# Patient Record
Sex: Male | Born: 1955
Health system: Southern US, Community
[De-identification: ages and names within clinical notes are randomized; demographics above are authoritative.]

## PROBLEM LIST (undated history)

## (undated) DIAGNOSIS — E785 Hyperlipidemia, unspecified: Secondary | ICD-10-CM

## (undated) DIAGNOSIS — C61 Malignant neoplasm of prostate: Secondary | ICD-10-CM

## (undated) DIAGNOSIS — E871 Hypo-osmolality and hyponatremia: Secondary | ICD-10-CM

## (undated) DIAGNOSIS — N529 Male erectile dysfunction, unspecified: Secondary | ICD-10-CM

## (undated) DIAGNOSIS — I2510 Atherosclerotic heart disease of native coronary artery without angina pectoris: Secondary | ICD-10-CM

## (undated) DIAGNOSIS — R001 Bradycardia, unspecified: Secondary | ICD-10-CM

## (undated) DIAGNOSIS — I509 Heart failure, unspecified: Secondary | ICD-10-CM

## (undated) DIAGNOSIS — F1010 Alcohol abuse, uncomplicated: Secondary | ICD-10-CM

## (undated) DIAGNOSIS — I428 Other cardiomyopathies: Secondary | ICD-10-CM

## (undated) DIAGNOSIS — I739 Peripheral vascular disease, unspecified: Secondary | ICD-10-CM

## (undated) DIAGNOSIS — N183 Chronic kidney disease, stage 3 unspecified: Secondary | ICD-10-CM

## (undated) DIAGNOSIS — J449 Chronic obstructive pulmonary disease, unspecified: Secondary | ICD-10-CM

## (undated) DIAGNOSIS — I10 Essential (primary) hypertension: Secondary | ICD-10-CM

## (undated) DIAGNOSIS — N401 Enlarged prostate with lower urinary tract symptoms: Secondary | ICD-10-CM

## (undated) DIAGNOSIS — R0600 Dyspnea, unspecified: Secondary | ICD-10-CM

## (undated) DIAGNOSIS — R9431 Abnormal electrocardiogram [ECG] [EKG]: Secondary | ICD-10-CM

## (undated) DIAGNOSIS — I5022 Chronic systolic (congestive) heart failure: Secondary | ICD-10-CM

## (undated) DIAGNOSIS — Z720 Tobacco use: Secondary | ICD-10-CM

## (undated) DIAGNOSIS — I440 Atrioventricular block, first degree: Secondary | ICD-10-CM

## (undated) DIAGNOSIS — K219 Gastro-esophageal reflux disease without esophagitis: Secondary | ICD-10-CM

## (undated) DIAGNOSIS — I219 Acute myocardial infarction, unspecified: Secondary | ICD-10-CM

## (undated) HISTORY — DX: Acute myocardial infarction, unspecified: I21.9

## (undated) HISTORY — PX: APPENDECTOMY: SHX54

## (undated) HISTORY — DX: Abnormal electrocardiogram (ECG) (EKG): R94.31

## (undated) HISTORY — DX: Hypo-osmolality and hyponatremia: E87.1

## (undated) HISTORY — DX: Other cardiomyopathies: I42.8

## (undated) HISTORY — DX: Atherosclerotic heart disease of native coronary artery without angina pectoris: I25.10

## (undated) HISTORY — DX: Chronic kidney disease, stage 3 (moderate): N18.3

## (undated) HISTORY — DX: Peripheral vascular disease, unspecified: I73.9

## (undated) HISTORY — DX: Heart failure, unspecified: I50.9

## (undated) HISTORY — DX: Bradycardia, unspecified: R00.1

## (undated) HISTORY — DX: Chronic kidney disease, stage 3 unspecified: N18.30

## (undated) HISTORY — DX: Hyperlipidemia, unspecified: E78.5

## (undated) HISTORY — DX: Essential (primary) hypertension: I10

## (undated) HISTORY — DX: Gastro-esophageal reflux disease without esophagitis: K21.9

---

## 2007-07-01 ENCOUNTER — Emergency Department (HOSPITAL_COMMUNITY): Admission: EM | Admit: 2007-07-01 | Discharge: 2007-07-01 | Payer: Self-pay | Admitting: Emergency Medicine

## 2010-09-29 LAB — CBC
Platelets: 231
RDW: 12.9

## 2010-09-29 LAB — URINALYSIS, ROUTINE W REFLEX MICROSCOPIC
Bilirubin Urine: NEGATIVE
Glucose, UA: NEGATIVE
Hgb urine dipstick: NEGATIVE
Ketones, ur: 15 — AB
Nitrite: NEGATIVE
Protein, ur: 30 — AB
Specific Gravity, Urine: 1.026
Urobilinogen, UA: 0.2
pH: 8

## 2010-09-29 LAB — URINE MICROSCOPIC-ADD ON

## 2010-09-29 LAB — BASIC METABOLIC PANEL
BUN: 12
CO2: 26
Calcium: 10
Chloride: 99
Creatinine, Ser: 1.01
GFR calc Af Amer: >60
GFR calc non Af Amer: >60
Glucose, Bld: 118 — ABNORMAL HIGH
Potassium: 3.6
Sodium: 136

## 2010-09-29 LAB — URINE CULTURE: Colony Count: 15000

## 2010-09-29 LAB — HEPATIC FUNCTION PANEL
ALT: 23
AST: 31
Indirect Bilirubin: 0.9
Total Protein: 8.8 — ABNORMAL HIGH

## 2010-09-29 LAB — DIFFERENTIAL
Basophils Absolute: 0.2 — ABNORMAL HIGH
Basophils Relative: 2 — ABNORMAL HIGH
Lymphocytes Relative: 16
Neutro Abs: 6.6

## 2015-07-03 DIAGNOSIS — I252 Old myocardial infarction: Secondary | ICD-10-CM

## 2015-07-03 HISTORY — DX: Old myocardial infarction: I25.2

## 2015-07-18 ENCOUNTER — Emergency Department (HOSPITAL_COMMUNITY): Payer: Self-pay

## 2015-07-18 ENCOUNTER — Inpatient Hospital Stay (HOSPITAL_COMMUNITY)
Admission: EM | Admit: 2015-07-18 | Discharge: 2015-07-21 | DRG: 281 | Disposition: A | Discharge status: Home / Self Care | Payer: Self-pay | Attending: Internal Medicine | Admitting: Internal Medicine

## 2015-07-18 ENCOUNTER — Encounter (HOSPITAL_COMMUNITY): Payer: Self-pay

## 2015-07-18 DIAGNOSIS — I2510 Atherosclerotic heart disease of native coronary artery without angina pectoris: Secondary | ICD-10-CM | POA: Diagnosis present

## 2015-07-18 DIAGNOSIS — I119 Hypertensive heart disease without heart failure: Secondary | ICD-10-CM

## 2015-07-18 DIAGNOSIS — Z87898 Personal history of other specified conditions: Secondary | ICD-10-CM

## 2015-07-18 DIAGNOSIS — Z8249 Family history of ischemic heart disease and other diseases of the circulatory system: Secondary | ICD-10-CM

## 2015-07-18 DIAGNOSIS — I519 Heart disease, unspecified: Secondary | ICD-10-CM

## 2015-07-18 DIAGNOSIS — E871 Hypo-osmolality and hyponatremia: Secondary | ICD-10-CM | POA: Diagnosis present

## 2015-07-18 DIAGNOSIS — I209 Angina pectoris, unspecified: Secondary | ICD-10-CM

## 2015-07-18 DIAGNOSIS — I5189 Other ill-defined heart diseases: Secondary | ICD-10-CM

## 2015-07-18 DIAGNOSIS — I10 Essential (primary) hypertension: Secondary | ICD-10-CM | POA: Diagnosis present

## 2015-07-18 DIAGNOSIS — I959 Hypotension, unspecified: Secondary | ICD-10-CM | POA: Diagnosis not present

## 2015-07-18 DIAGNOSIS — E785 Hyperlipidemia, unspecified: Secondary | ICD-10-CM | POA: Diagnosis present

## 2015-07-18 DIAGNOSIS — F17210 Nicotine dependence, cigarettes, uncomplicated: Secondary | ICD-10-CM

## 2015-07-18 DIAGNOSIS — R079 Chest pain, unspecified: Secondary | ICD-10-CM | POA: Diagnosis present

## 2015-07-18 DIAGNOSIS — I214 Non-ST elevation (NSTEMI) myocardial infarction: Principal | ICD-10-CM | POA: Diagnosis present

## 2015-07-18 DIAGNOSIS — F17200 Nicotine dependence, unspecified, uncomplicated: Secondary | ICD-10-CM | POA: Diagnosis present

## 2015-07-18 LAB — BASIC METABOLIC PANEL
ANION GAP: 10 (ref 5–15)
BUN: 12 mg/dL (ref 6–20)
CALCIUM: 9.6 mg/dL (ref 8.9–10.3)
CO2: 25 mmol/L (ref 22–32)
CREATININE: 1.03 mg/dL (ref 0.61–1.24)
Chloride: 97 mmol/L — ABNORMAL LOW (ref 101–111)
Glucose, Bld: 121 mg/dL — ABNORMAL HIGH (ref 65–99)
Potassium: 3.5 mmol/L (ref 3.5–5.1)
SODIUM: 132 mmol/L — AB (ref 135–145)

## 2015-07-18 LAB — RAPID URINE DRUG SCREEN, HOSP PERFORMED
Amphetamines: NOT DETECTED
Barbiturates: NOT DETECTED
Benzodiazepines: NOT DETECTED
Cocaine: NOT DETECTED
OPIATES: NOT DETECTED
Tetrahydrocannabinol: POSITIVE — AB

## 2015-07-18 LAB — LIPID PANEL
Cholesterol: 238 mg/dL — ABNORMAL HIGH (ref 0–200)
HDL: 67 mg/dL (ref >40)
LDL Cholesterol: 155 mg/dL — ABNORMAL HIGH (ref 0–99)
Total CHOL/HDL Ratio: 3.6 RATIO
Triglycerides: 81 mg/dL (ref <150)
VLDL: 16 mg/dL (ref 0–40)

## 2015-07-18 LAB — I-STAT TROPONIN, ED: TROPONIN I, POC: 0.94 ng/mL — AB (ref 0.00–0.08)

## 2015-07-18 LAB — TROPONIN I: Troponin I: 2 ng/mL (ref <0.03)

## 2015-07-18 LAB — HEPATIC FUNCTION PANEL
ALBUMIN: 4.3 g/dL (ref 3.5–5.0)
ALK PHOS: 60 U/L (ref 38–126)
ALT: 28 U/L (ref 17–63)
AST: 55 U/L — ABNORMAL HIGH (ref 15–41)
Bilirubin, Direct: <0.1 mg/dL — ABNORMAL LOW (ref 0.1–0.5)
TOTAL PROTEIN: 9.3 g/dL — AB (ref 6.5–8.1)
Total Bilirubin: 1 mg/dL (ref 0.3–1.2)

## 2015-07-18 LAB — CBC
HCT: 51.4 % (ref 39.0–52.0)
HEMOGLOBIN: 17.8 g/dL — AB (ref 13.0–17.0)
MCH: 33.5 pg (ref 26.0–34.0)
MCHC: 34.6 g/dL (ref 30.0–36.0)
MCV: 96.6 fL (ref 78.0–100.0)
PLATELETS: 282 10*3/uL (ref 150–400)
RBC: 5.32 MIL/uL (ref 4.22–5.81)
RDW: 12.7 % (ref 11.5–15.5)
WBC: 8.4 10*3/uL (ref 4.0–10.5)

## 2015-07-18 LAB — MRSA PCR SCREENING: MRSA BY PCR: NEGATIVE

## 2015-07-18 LAB — HEPARIN LEVEL (UNFRACTIONATED): Heparin Unfractionated: 0.29 IU/mL — ABNORMAL LOW (ref 0.30–0.70)

## 2015-07-18 LAB — LIPASE, BLOOD: LIPASE: 19 U/L (ref 11–51)

## 2015-07-18 MED ORDER — ADULT MULTIVITAMIN W/MINERALS CH
1.0000 | ORAL_TABLET | Freq: Every day | ORAL | Status: DC
  Administered 2015-07-18 – 2015-07-21 (×4): 1 via ORAL
  Filled 2015-07-18 (×4): qty 1

## 2015-07-18 MED ORDER — FOLIC ACID 1 MG PO TABS
1.0000 mg | ORAL_TABLET | Freq: Every day | ORAL | Status: DC
  Administered 2015-07-18 – 2015-07-21 (×4): 1 mg via ORAL
  Filled 2015-07-18 (×4): qty 1

## 2015-07-18 MED ORDER — FENTANYL CITRATE (PF) 100 MCG/2ML IJ SOLN
50.0000 ug | Freq: Once | INTRAMUSCULAR | Status: AC
  Administered 2015-07-18: 50 ug via INTRAVENOUS
  Filled 2015-07-18: qty 2

## 2015-07-18 MED ORDER — SODIUM CHLORIDE 0.9 % WEIGHT BASED INFUSION
3.0000 mL/kg/h | INTRAVENOUS | Status: DC
  Administered 2015-07-19: 3 mL/kg/h via INTRAVENOUS

## 2015-07-18 MED ORDER — NITROGLYCERIN IN D5W 200-5 MCG/ML-% IV SOLN
0.0000 ug/min | INTRAVENOUS | Status: DC
  Administered 2015-07-18: 5 ug/min via INTRAVENOUS
  Filled 2015-07-18: qty 250

## 2015-07-18 MED ORDER — METOPROLOL TARTRATE 25 MG PO TABS: 12.5000 mg | ORAL_TABLET | Freq: Two times a day (BID) | ORAL | Status: DC

## 2015-07-18 MED ORDER — SODIUM CHLORIDE 0.9 % IV BOLUS (SEPSIS)
1000.0000 mL | Freq: Once | INTRAVENOUS | Status: AC
  Administered 2015-07-18: 1000 mL via INTRAVENOUS

## 2015-07-18 MED ORDER — SODIUM CHLORIDE 0.9% FLUSH: 3.0000 mL | INTRAVENOUS | Status: DC | PRN

## 2015-07-18 MED ORDER — ASPIRIN 81 MG PO CHEW
81.0000 mg | CHEWABLE_TABLET | ORAL | Status: AC
  Administered 2015-07-19: 81 mg via ORAL
  Filled 2015-07-18: qty 1

## 2015-07-18 MED ORDER — ATORVASTATIN CALCIUM 40 MG PO TABS
40.0000 mg | ORAL_TABLET | Freq: Every day | ORAL | Status: DC
  Administered 2015-07-18 – 2015-07-20 (×3): 40 mg via ORAL
  Filled 2015-07-18 (×3): qty 1

## 2015-07-18 MED ORDER — SODIUM CHLORIDE 0.9% FLUSH
3.0000 mL | Freq: Two times a day (BID) | INTRAVENOUS | Status: DC
  Administered 2015-07-18 – 2015-07-19 (×2): 3 mL via INTRAVENOUS

## 2015-07-18 MED ORDER — SODIUM CHLORIDE 0.9 % WEIGHT BASED INFUSION
1.0000 mL/kg/h | INTRAVENOUS | Status: DC
Start: 2015-07-19 — End: 2015-07-19
  Administered 2015-07-19: 1 mL/kg/h via INTRAVENOUS

## 2015-07-18 MED ORDER — ONDANSETRON HCL 4 MG/2ML IJ SOLN: 4.0000 mg | Freq: Once | INTRAMUSCULAR | Status: DC

## 2015-07-18 MED ORDER — METOPROLOL TARTRATE 25 MG PO TABS
25.0000 mg | ORAL_TABLET | Freq: Two times a day (BID) | ORAL | Status: DC
  Administered 2015-07-18 – 2015-07-19 (×3): 25 mg via ORAL
  Filled 2015-07-18 (×3): qty 1

## 2015-07-18 MED ORDER — THIAMINE HCL 100 MG/ML IJ SOLN
100.0000 mg | Freq: Every day | INTRAMUSCULAR | Status: DC
  Administered 2015-07-19: 100 mg via INTRAVENOUS
  Filled 2015-07-18: qty 1
  Filled 2015-07-18: qty 2
  Filled 2015-07-18: qty 1

## 2015-07-18 MED ORDER — HEPARIN (PORCINE) IN NACL 100-0.45 UNIT/ML-% IJ SOLN
1100.0000 [IU]/h | INTRAMUSCULAR | Status: DC
  Administered 2015-07-18: 1000 [IU]/h via INTRAVENOUS
  Filled 2015-07-18 (×2): qty 250

## 2015-07-18 MED ORDER — ASPIRIN 81 MG PO CHEW: 324.0000 mg | CHEWABLE_TABLET | Freq: Once | ORAL | Status: DC

## 2015-07-18 MED ORDER — SODIUM CHLORIDE 0.9 % IV SOLN: 250.0000 mL | INTRAVENOUS | Status: DC | PRN

## 2015-07-18 MED ORDER — VITAMIN B-1 100 MG PO TABS
100.0000 mg | ORAL_TABLET | Freq: Every day | ORAL | Status: DC
  Administered 2015-07-18 – 2015-07-21 (×3): 100 mg via ORAL
  Filled 2015-07-18 (×4): qty 1

## 2015-07-18 MED ORDER — HEPARIN (PORCINE) IN NACL 100-0.45 UNIT/ML-% IJ SOLN: 12.0000 [IU]/kg/h | INTRAMUSCULAR | Status: DC

## 2015-07-18 MED ORDER — ASPIRIN EC 81 MG PO TBEC
81.0000 mg | DELAYED_RELEASE_TABLET | Freq: Every day | ORAL | Status: DC
  Administered 2015-07-19 – 2015-07-21 (×3): 81 mg via ORAL
  Filled 2015-07-18 (×3): qty 1

## 2015-07-18 MED ORDER — ONDANSETRON HCL 4 MG/2ML IJ SOLN
4.0000 mg | Freq: Four times a day (QID) | INTRAMUSCULAR | Status: DC | PRN
  Filled 2015-07-18: qty 2

## 2015-07-18 MED ORDER — HEPARIN SODIUM (PORCINE) 5000 UNIT/ML IJ SOLN
4000.0000 [IU] | Freq: Once | INTRAMUSCULAR | Status: AC
  Administered 2015-07-18: 4000 [IU] via INTRAVENOUS

## 2015-07-18 MED ORDER — LISINOPRIL 5 MG PO TABS
5.0000 mg | ORAL_TABLET | Freq: Every day | ORAL | Status: DC
  Administered 2015-07-18: 5 mg via ORAL
  Filled 2015-07-18: qty 1

## 2015-07-18 MED ORDER — ACETAMINOPHEN 325 MG PO TABS: 650.0000 mg | ORAL_TABLET | ORAL | Status: DC | PRN

## 2015-07-18 MED ORDER — NITROGLYCERIN 2 % TD OINT
1.0000 [in_us] | TOPICAL_OINTMENT | Freq: Once | TRANSDERMAL | Status: AC
  Administered 2015-07-18: 1 [in_us] via TOPICAL
  Filled 2015-07-18: qty 1

## 2015-07-18 MED ORDER — NITROGLYCERIN 0.4 MG SL SUBL
0.4000 mg | SUBLINGUAL_TABLET | SUBLINGUAL | Status: DC | PRN
  Administered 2015-07-18: 0.4 mg via SUBLINGUAL
  Filled 2015-07-18: qty 1

## 2015-07-18 MED ORDER — ATORVASTATIN CALCIUM 40 MG PO TABS: 40.0000 mg | ORAL_TABLET | Freq: Every day | ORAL | Status: DC

## 2015-07-18 NOTE — Progress Notes (Signed)
ANTICOAGULATION CONSULT NOTE - Initial Consult  Pharmacy Consult:  Heparin Indication: chest pain/ACS  No Known Allergies  Patient Measurements: Height: 6' (182.9 cm) Weight: 178 lb (80.74 kg) IBW/kg (Calculated) : 77.6 Heparin Dosing Weight: 81 kg  Vital Signs: BP: 173/99 mmHg (07/16 1430) Pulse Rate: 71 (07/16 1430)  Labs:  Recent Labs  07/18/15 1345  HGB 17.8*  HCT 51.4  PLT 282  CREATININE 1.03    Estimated Creatinine Clearance: 83.7 mL/min (by C-G formula based on Cr of 1.03).   Medical History: History reviewed. No pertinent past medical history.    Assessment: Andres Boyd presented with history of chest for a couple of days.  Pharmacy consulted to initiate IV heparin for ACS.  Baseline labs and home meds reviewed.   Goal of Therapy:  Heparin level 0.3-0.7 units/ml Monitor platelets by anticoagulation protocol: Yes    Plan:  - Heparin 4000 units IV bolus as ordered - Reduce heparin gtt to 1000 units/hr - Check 6 hr HL - Daily HL / CBC   Yoon Barca D. Mina Marble, PharmD, BCPS Pager:  [phone] 07/18/2015, 3:12 PM

## 2015-07-18 NOTE — Progress Notes (Signed)
ANTICOAGULATION CONSULT NOTE - Follow Up Consult  Pharmacy Consult for heparin Indication: NSTEMI  Labs:  Recent Labs  07/18/15 1345 07/18/15 1947 07/18/15 2242  HGB 17.8*  --   --   HCT 51.4  --   --   PLT 282  --   --   HEPARINUNFRC  --   --  0.29*  CREATININE 1.03  --   --   TROPONINI  --  2.00*  --     Assessment: 60yo male slightly subtherapeutic on heparin with initial dosing for NSTEMI.  Goal of Therapy:  Heparin level 0.3-0.7 units/ml   Plan:  Will increase heparin gtt slightly to 1100 units/hr and check level in [place], PharmD, BCPS  07/18/2015,11:24 PM

## 2015-07-18 NOTE — ED Notes (Signed)
Attempted blood draw X2 without success. Will ask phlebotomy to attempt.

## 2015-07-18 NOTE — Progress Notes (Signed)
Md at bedside. Aware of trop 2.00 called from lab.  Given 1 SL NTG pain now 2/10 CP.  Will continue to monitor. Saunders Revel T

## 2015-07-18 NOTE — Progress Notes (Signed)
SUBJECTIVE Called to bedside by RN for worsening chest pain. At bedside, patient reports fluctuating chest pain that's been of the same quality since admission localized to the lower left chest. Pain is associated with nausea. No prior cardiac history. No change in quality with breathing.  Of note, he does acknowledge having >4 drinks of alcohol infrequently, most recently on Thursday. No prior history of withdrawal.    OBJECTIVE Filed Vitals:   07/18/15 2020 07/18/15 2025  BP: 142/126 140/118  Pulse: 109 101  Resp: 11 17   General: resting in bed, no acute distress Cardiac: tachycardia, no rubs, murmurs or gallops Pulm: clear to auscultation bilaterally, no wheezes, rales, or rhonchi Abd: soft, nontender, nondistended, BS present Ext: warm and well perfused, no pedal edema Neuro: alert and oriented X3, responding to questions appropriately  ASSESSMENT Suspect chest pain is in the setting of his NSTEMI. Recent troponin elevated at 2.0, up from   EKG with sinus tachycardia though ST changes noted in II, III, V3-V5 with prolonged QTc 505.   PLAN -Spoke with cardiology who recommended nitro gtt at 10 mcg/min -Discontinued ondansetron given QTc prolongation -Started CIWA scoring protocol

## 2015-07-18 NOTE — H&P (Addendum)
Date: 07/18/2015               Patient Name:  Andres Boyd MRN: [id number]  DOB: 01-Sep-1955 Age / Sex: 60 y.o., male   PCP: No primary care provider on file.         Medical Service: Internal Medicine Teaching Service         Attending Physician: Dr. Aldine Contes, MD    First Contact: Dr. Asencion Partridge Pager: [phone]  Second Contact: Dr. Jacques Earthly Pager: [phone]       After Hours (After 5p/  First Contact Pager: [phone]  weekends / holidays): Second Contact Pager: [phone]   Chief Complaint: Chest pain and nausea  History of Present Illness: Andres Boyd is a pleasant 60 year old gentleman with no documented PMH who has not seen a doctor in years, who developed chest pain and shortness of breath while moving furniture in the heat on Friday. This chest pain gradually increased and feels like heavy substernal pressure 9-10/10. This pain is also accompanied by significant nausea and retching, and he has had one episode of emesis. After this pain began he left work and went home to rest, but the pain persisted but came and went in intensity. The pain and nausea and prevented him sleeping and eating since Friday. Andres Boyd has never had an episode like this before. He denies neck, shoulder, jaw pain. He has a significant smoking and alcohol history, denies recreational drugs other than marijuana. He says that the aspirin and nitroglycerin he has received in the ED has improved his chest pain to a 4/10.    He received 325 mg Aspirin, 4 mg Zofran, Albuterol, Atroven, and 500cc IVF prior to arrival in ED. ED vitals 165/101, HR 79, RR 16, 100% on RA. Initial troponin elevated at 0.94, EKG shows LVH but no ST changes. CBC, Hepatic function panel, CXR largely unremarkable, lipase negative.  Meds: Current Facility-Administered Medications  Medication Dose Route Frequency Provider Last Rate Last Dose  . 0.9 %  sodium chloride infusion  250 mL Intravenous PRN Bhavinkumar Bhagat, PA      .  [START ON 07/19/2015] 0.9% sodium chloride infusion  3 mL/kg/hr Intravenous Continuous Bhavinkumar Bhagat, PA       Followed by  . [START ON 07/19/2015] 0.9% sodium chloride infusion  1 mL/kg/hr Intravenous Continuous Bhavinkumar Bhagat, PA      . acetaminophen (TYLENOL) tablet 650 mg  650 mg Oral Q4H PRN Milagros Loll, MD      . aspirin chewable tablet 324 mg  324 mg Oral Once Margette Fast, MD   324 mg at 07/18/15 1445  . [START ON 07/19/2015] aspirin chewable tablet 81 mg  81 mg Oral Pre-Cath Bhavinkumar [place], [place]      . [START ON 07/19/2015] aspirin EC tablet 81 mg  81 mg Oral Daily Milagros Loll, MD      . atorvastatin (LIPITOR) tablet 40 mg  40 mg Oral q1800 Milagros Loll, MD      . folic acid (FOLVITE) tablet 1 mg  1 mg Oral Daily Asencion Partridge, MD      . heparin ADULT infusion 100 units/mL (25000 units/244mL sodium chloride 0.45%)  1,000 Units/hr Intravenous Continuous Tyrone Apple, RPH 10 mL/hr at 07/18/15 1619 1,000 Units/hr at 07/18/15 1619  . lisinopril (PRINIVIL,ZESTRIL) tablet 5 mg  5 mg Oral Daily Dorothy Spark, MD   5 mg at 07/18/15 1650  . metoprolol tartrate (LOPRESSOR)  tablet 25 mg  25 mg Oral BID Dorothy Spark, MD      . multivitamin with minerals tablet 1 tablet  1 tablet Oral Daily Asencion Partridge, MD      . nitroGLYCERIN (NITROSTAT) SL tablet 0.4 mg  0.4 mg Sublingual Q5 Min x 3 PRN Milagros Loll, MD      . ondansetron [facility]) injection 4 mg  4 mg Intravenous Once Margette Fast, MD      . ondansetron [facility]) injection 4 mg  4 mg Intravenous Q6H PRN Milagros Loll, MD      . sodium chloride flush (NS) 0.9 % injection 3 mL  3 mL Intravenous Q12H Bhavinkumar Bhagat, PA      . sodium chloride flush (NS) 0.9 % injection 3 mL  3 mL Intravenous PRN Bhavinkumar Bhagat, PA      . thiamine (VITAMIN B-1) tablet 100 mg  100 mg Oral Daily Asencion Partridge, MD       Or  . thiamine (B-1) injection 100 mg  100 mg Intravenous Daily Asencion Partridge, MD         Allergies: Allergies as of 07/18/2015  . (No Known Allergies)   History reviewed. No pertinent past medical history.  Family History: Multiple relatives with HTN, DM, denies family history of MI, CVA, DVT, PE  Social History: He lives at home with his two sons and works for a [employer]. His wife passed away approx 6 months ago from stage 4 lung cancer. He has a 42 pack year smoking history, denies chewing tobacco, he drinks at least 5-6 beers a day, sometimes much more, and occasionally marijuana, denies other recreational drug use.  Review of Systems: A complete ROS was negative except as per HPI.   Physical Exam: Blood pressure 152/106, pulse 93, resp. rate 15, height 6' (1.829 m), weight 80.74 kg (178 lb), SpO2 100 %. General appearance: Gentleman resting in bed, mild distress, appears older than stated age HENT: Normocephalic, atraumatic, poor dentition, no audible carotid bruits, no JVD, trachea midline Cardiovascular: Regular rate and rhythm, no audible murmurs, rubs, gallops Respiratory: Clear to auscultation bilaterally, normal work of breathing Abdomen: Soft, bowel sounds present, non-tender, non-distended Skin: Warm, dry intact Extremities: Normal bulk and ROM, no edema, DP/PT 2+ bilaterally Neuro: Cranial nerves grossly intact, alert and oriented Psych: Appropriate affect  EKG: NSR with LVH  CXR: Negative for cardiopulmonary disease  Troponin (Point of Care Test)  Recent Labs  07/18/15 1356  TROPIPOC 0.94*   Drugs of Abuse     Component Value Date/Time   LABOPIA NONE DETECTED 07/18/2015 1635   COCAINSCRNUR NONE DETECTED 07/18/2015 1635   LABBENZ NONE DETECTED 07/18/2015 1635   AMPHETMU NONE DETECTED 07/18/2015 1635   THCU POSITIVE* 07/18/2015 1635   LABBARB NONE DETECTED 07/18/2015 1635    Assessment & Plan by Problem: Andres Boyd is a 60 year old gentleman with no documented PMH who presents with chest pain and  NSTEMI.  1. NSTEMI, no  ischemic EKG changes, initial troponin elevated at 0.94, patient with untreated HTN, smoking history, cardiology following  - Trend serial troponins  - Heparin gtt  - Telemetry  - NPO at midnight for cath tomorrow  - Asa 325 qd  - Metoprolol 25mg  BID  - Lisinopril 5mg  QD  - Atorvastatin 40mg   - Misc cardiac:   - Lipid panel pending   - HbA1c pending   - UDS only pos for MJ  2. Hypertension, 160s/100s on exam  -  Starting Metoprolol and Lisinopril   3. LVH, cardiac function undetermined  - TTE  4. Preventative health  - HIV pending  - HepCV pending  Dispo: Admit patient to Observation with expected length of stay less than 2 midnights.  Signed: Asencion Partridge, MD 07/18/2015, 7:25 PM  Pager: [phone]

## 2015-07-18 NOTE — Consult Note (Signed)
CARDIOLOGY CONSULT NOTE   Patient ID: Yochanan Dempewolf MRN: [id number], DOB/AGE: 08/06/1955   Admit date: 07/18/2015 Date of Consult: 07/18/2015  Primary Physician: No primary care provider on file. Primary Cardiologist: none, new patient  Reason for consult:  Chest pain, elevated troponin  Problem List  History reviewed. No pertinent past medical history.  History reviewed. No pertinent past surgical history.   Allergies  No Known Allergies  HPI   This is a very pleasant 60 year old gentleman with no significant prior medical history however hasn't seen a doctor in many many years. The patient states that he has developed chest pain on Friday that was nonexertional retrosternal like somebody sitting on his chest and it progressively got worse to the point when he felt nauseous and also had abdominal pain. He presented to the ER today and was given nitroglycerin, aspirin, fentanyl. Improvement of his pain from 10/10 to 4/10 currently.  The patient doesn't have any prior history of heart disease, he smokes almost a pack a day since he was 60 years old and drinks 5-6 beers a day. He doesn't have a family history of premature coronary artery disease or sudden cardiac death. He states that this is first time he's feeling chest pain like that he's otherwise fairly active and doesn't have symptoms. He has been depressed since his wife died of stage IV lung cancer in April of this year. He has lost approximately 15 pounds since then. He denies any prior palpitations or syncope he also denies lower extremity edema orthopnea or paroxysmal nocturnal dyspnea. He has not been taking any medicines at home. His first troponin came back positive at 0.9. He was started on heparin IV by the ER physician.  Inpatient Medications  . aspirin  324 mg Oral Once  . heparin  4,000 Units Intravenous Once  . ondansetron (ZOFRAN) IV  4 mg Intravenous Once   Family History History reviewed. No pertinent family  history.   Social History Social History   Social History  . Marital Status: Single    Spouse Name: N/A  . Number of Children: N/A  . Years of Education: N/A   Occupational History  . Not on file.   Social History Main Topics  . Smoking status: Current Every Day Smoker -- 0.75 packs/day    Types: Cigarettes  . Smokeless tobacco: Not on file  . Alcohol Use: 7.2 oz/week    12 Cans of beer per week     Comment: 3-4 times per week  . Drug Use: Not on file  . Sexual Activity: Not on file   Other Topics Concern  . Not on file   Social History Narrative  . No narrative on file     Review of Systems  General:  No chills, fever, night sweats or weight changes.  Cardiovascular:  No chest pain, dyspnea on exertion, edema, orthopnea, palpitations, paroxysmal nocturnal dyspnea. Dermatological: No rash, lesions/masses Respiratory: No cough, dyspnea Urologic: No hematuria, dysuria Abdominal:   No nausea, vomiting, diarrhea, bright red blood per rectum, melena, or hematemesis Neurologic:  No visual changes, wkns, changes in mental status. All other systems reviewed and are otherwise negative except as noted above.  Physical Exam  Blood pressure 172/102, pulse 72, resp. rate 9, height 6' (1.829 m), weight 178 lb (80.74 kg), SpO2 100 %.  General: Pleasant, NAD Psych: Normal affect. Neuro: Alert and oriented X 3. Moves all extremities spontaneously. HEENT: Normal  Neck: Supple without bruits or JVD. Lungs:  Resp regular  and unlabored, CTA. Heart: RRR no s3, s4, or murmurs. Abdomen: Soft, non-tender, non-distended, BS + x 4.  Extremities: No clubbing, cyanosis or edema. DP/PT/Radials 2+ and equal bilaterally.  Labs  No results for input(s): CKTOTAL, CKMB, TROPONINI in the last 72 hours. Lab Results  Component Value Date   WBC 8.4 07/18/2015   HGB 17.8* 07/18/2015   HCT 51.4 07/18/2015   MCV 96.6 07/18/2015   PLT 282 07/18/2015    Recent Labs Lab 07/18/15 1345  NA 132*   K 3.5  CL 97*  CO2 25  BUN 12  CREATININE 1.03  CALCIUM 9.6  PROT 9.3*  BILITOT 1.0  ALKPHOS 60  ALT 28  AST 55*  GLUCOSE 121*   Radiology/Studies  Dg Chest 2 View  07/18/2015  CLINICAL DATA:  Chest pain, shortness of breath. EXAM: CHEST  2 VIEW COMPARISON:  None. FINDINGS: The heart size and mediastinal contours are within normal limits. Both lungs are clear. No pneumothorax or pleural effusion is noted. The visualized skeletal structures are unremarkable. IMPRESSION: No active cardiopulmonary disease. Electronically Signed   By: Marijo Conception, M.D.   On: 07/18/2015 13:27   Echocardiogram - none  ECG: SR, LVH, QT/QTc 430/500 ms     ASSESSMENT AND PLAN  60 year old male  1. Non-STEMI - first troponin 0.94, EKG showing sinus rhythm with LVH. Patient has multiple risk factors for coronary artery disease including 40+ year of smoking, untreated hypertension, we will schedule him for left cardiac catheterization for tomorrow. His creatinine is normal. - We will start him on aspirin 325 mg daily, metoprolol 25 mg by mouth twice a day, lisinopril 5 mg daily and uptitrate as needed for blood pressure, - We'll start atorvastatin 40 mg daily cautiously and follow his LFTs closely as he has been chronically using alcohol and his AST is 55. - He has been already started on IV heparin, we will follow serial troponin.  2. Hypertensive heart disease without CHF - start Lopressor and lisinopril. - We will order an echocardiogram to further evaluate systolic diastolic function and degree of LVH.  3. Lipids - never checked we will order now.  4. Smoking cessation advised.   Signed, Ena Dawley, MD, [facility] 07/18/2015, 3:14 PM

## 2015-07-18 NOTE — ED Notes (Signed)
[facility]- pt coming from home with c/o dehydration, nausea, shortness of breath and intermittent chest pain since Friday. He reports working outside and got hot on Friday, one episode of emesis. Intermittent chest pain since Friday with shortness of breath. Pt received 325mg  ASA, 125mg  of solumedrol, 4mg  of zofran, 10mg  of albuter, 0.5mg  of atrovent and 500cc of fluid PTA.

## 2015-07-18 NOTE — ED Notes (Signed)
Phlebotomy at bedside.

## 2015-07-18 NOTE — Progress Notes (Signed)
Md notified of pt's ciwa 6 and cp 5/10.  Will obtain ekg and give sl ntg. Placed on 4lnc will continue to monitor. Saunders Revel T

## 2015-07-18 NOTE — ED Notes (Signed)
Pt st's chest pain is returning, rates a #6 on scale of 0/10.  Pt also c/o feeling hot.

## 2015-07-18 NOTE — ED Provider Notes (Signed)
Emergency Department Provider Note  Time seen: Approximately 12:55 PM  I have reviewed the triage vital signs and the nursing notes.   HISTORY  Chief Complaint Shortness of Breath; Fatigue; and Chest Pain   HPI Andres Boyd is a 60 y.o. male with PMH of tobacco use, HTN, HLD, and positive family history of CAD presents to the emergency department with several days of intermittent chest discomfort and dyspnea. The patient states that 2 days ago he was working in the heat when he began having cramping discomfort in his arms and legs. He attributed this to dehydration. When he returned home later that evening he began having intermittent, cramping, central chest discomfort. No associated shortness of breath. This is continued intermittently for the past several days. When he began having discomfort this morning he presented to the emergency department. The patient does not primary care physician. No prior history of coronary artery disease but is a smoker with a strong family history.    History reviewed. No pertinent past medical history.  There are no active problems to display for this patient.   History reviewed. No pertinent past surgical history.  No current outpatient prescriptions on file.  Allergies Review of patient's allergies indicates no known allergies.  History reviewed. No pertinent family history.  Social History Social History  Substance Use Topics  . Smoking status: Current Every Day Smoker -- 0.75 packs/day    Types: Cigarettes  . Smokeless tobacco: None  . Alcohol Use: 7.2 oz/week    12 Cans of beer per week     Comment: 3-4 times per week    Review of Systems  Constitutional: No fever/chills Eyes: No visual changes. ENT: No sore throat. Cardiovascular: Positive intermittent chest pain. Respiratory: Denies shortness of breath. Gastrointestinal: No abdominal pain. no vomiting.  No diarrhea.  No constipation. Positive nausea.  Genitourinary:  Negative for dysuria. Musculoskeletal: Negative for back pain. Skin: Negative for rash. Neurological: Negative for headaches, focal weakness or numbness.  10-point ROS otherwise negative.  ____________________________________________   PHYSICAL EXAM:  VITAL SIGNS: ED Triage Vitals  Enc Vitals Group     BP 07/18/15 1215 165/101 mmHg     Pulse Rate 07/18/15 1215 79     Resp 07/18/15 1215 16     SpO2 07/18/15 1212 100 %     Weight 07/18/15 1215 178 lb (80.74 kg)     Height 07/18/15 1215 6' (1.829 m)     Pain Score 07/18/15 1216 3   Constitutional: Alert and oriented. Well appearing and in no acute distress. Eyes: Conjunctivae are normal. PERRL. EOMI. Head: Atraumatic. Nose: No congestion/rhinnorhea. Mouth/Throat: Mucous membranes are moist.  Oropharynx non-erythematous. Neck: No stridor.   Cardiovascular: Normal rate, regular rhythm. Good peripheral circulation. Grossly normal heart sounds.   Respiratory: Normal respiratory effort.  No retractions. Lungs CTAB. Gastrointestinal: Soft and nontender. No distention.  Musculoskeletal: No lower extremity tenderness nor edema. No gross deformities of extremities. Neurologic:  Normal speech and language. No gross focal neurologic deficits are appreciated.  Skin:  Skin is warm, dry and intact. No rash noted. Psychiatric: Mood and affect are normal. Speech and behavior are normal.  ____________________________________________   LABS (all labs ordered are listed, but only abnormal results are displayed)  Labs Reviewed  BASIC METABOLIC PANEL - Abnormal; Notable for the following:    Sodium 132 (*)    Chloride 97 (*)    Glucose, Bld 121 (*)    All other components within normal limits  CBC -  Abnormal; Notable for the following:    Hemoglobin 17.8 (*)    All other components within normal limits  HEPATIC FUNCTION PANEL - Abnormal; Notable for the following:    Total Protein 9.3 (*)    AST 55 (*)    Bilirubin, Direct <0.1 (*)     All other components within normal limits  I-STAT TROPOININ, ED - Abnormal; Notable for the following:    Troponin i, poc 0.94 (*)    All other components within normal limits  LIPASE, BLOOD  TROPONIN I  TROPONIN I  HEMOGLOBIN A1C  URINE RAPID DRUG SCREEN, HOSP PERFORMED  LIPID PANEL   ____________________________________________  EKG  Reviewed multiple tracings in MUSE.  ____________________________________________  RADIOLOGY  Dg Chest 2 View  07/18/2015  CLINICAL DATA:  Chest pain, shortness of breath. EXAM: CHEST  2 VIEW COMPARISON:  None. FINDINGS: The heart size and mediastinal contours are within normal limits. Both lungs are clear. No pneumothorax or pleural effusion is noted. The visualized skeletal structures are unremarkable. IMPRESSION: No active cardiopulmonary disease. Electronically Signed   By: Marijo Conception, M.D.   On: 07/18/2015 13:27    ____________________________________________   PROCEDURES  Procedure(s) performed:   Procedures  CRITICAL CARE Performed by: Margette Fast Total critical care time: 40 minutes Critical care time was exclusive of separately billable procedures and treating other patients. Critical care was necessary to treat or prevent imminent or life-threatening deterioration. Critical care was time spent personally by me on the following activities: development of treatment plan with patient and/or surrogate as well as nursing, discussions with consultants, evaluation of patient's response to treatment, examination of patient, obtaining history from patient or surrogate, ordering and performing treatments and interventions, ordering and review of laboratory studies, ordering and review of radiographic studies, pulse oximetry and re-evaluation of patient's condition.  Nanda Quinton, MD Emergency Medicine ____________________________________________   INITIAL IMPRESSION / ASSESSMENT AND PLAN / ED COURSE  Pertinent labs & imaging results  that were available during my care of the patient were reviewed by me and considered in my medical decision making (see chart for details).  Patient presents to the emergency department for evaluation of intermittent chest discomfort over the past several days. Began with exertion while working in the heat. Patient has significant medical comorbidities and no current primary care physician. Pain is somewhat atypical but my concern for ACS is elevated. Will obtain labs. EKG reviewed with no acute ischemia. HEART score is 82 (age and multiple risk factors).   02:34 PM In reviewing patient's labs found troponin to be elevated to 0.94. Not notified by lab as documented in the results section. Patient reports new onset of chest discomfort and nausea. Will repeat EKG. Have also placed order for nitroglycerin and heparin infusion. Discussed this with the patient in detail who understands. Answered all questions.   02:59 PM Spoke with Cardiology regarding case. They will be down to evaluate in the ED. Will discuss with hospitalist regarding admission.   03:08 PM Spoke with admission team who will place orders. Gave small amount of fentanyl with continued chest pain. Nitropaste given. ASA given with EMS by nursing report.  ____________________________________________  FINAL CLINICAL IMPRESSION(S) / ED DIAGNOSES  Final diagnoses:  NSTEMI (non-ST elevated myocardial infarction) [facility])     MEDICATIONS GIVEN DURING THIS VISIT:  Medications  aspirin chewable tablet 324 mg (324 mg Oral Not Given 07/18/15 1445)  heparin ADULT infusion 100 units/mL (25000 units/28mL sodium chloride 0.45%) (1,000 Units/hr Intravenous New  Bag/Given 07/18/15 1619)  ondansetron (ZOFRAN) injection 4 mg (not administered)  aspirin EC tablet 81 mg (not administered)  nitroGLYCERIN (NITROSTAT) SL tablet 0.4 mg (not administered)  acetaminophen (TYLENOL) tablet 650 mg (not administered)  ondansetron (ZOFRAN) injection 4 mg (not  administered)  atorvastatin (LIPITOR) tablet 40 mg (not administered)  metoprolol tartrate (LOPRESSOR) tablet 25 mg (not administered)  lisinopril (PRINIVIL,ZESTRIL) tablet 5 mg (not administered)  sodium chloride 0.9 % bolus 1,000 mL (0 mLs Intravenous Stopped 07/18/15 1450)  nitroGLYCERIN (NITROGLYN) 2 % ointment 1 inch (1 inch Topical Given 07/18/15 1450)  heparin injection 4,000 Units (4,000 Units Intravenous Given 07/18/15 1619)  fentaNYL (SUBLIMAZE) injection 50 mcg (50 mcg Intravenous Given 07/18/15 1556)     NEW OUTPATIENT MEDICATIONS STARTED DURING THIS VISIT:  None   Note:  This document was prepared using Dragon voice recognition software and may include unintentional dictation errors.  Nanda Quinton, MD Emergency Medicine  Margette Fast, MD 07/18/15 [id number]

## 2015-07-18 NOTE — ED Notes (Signed)
Report attempted 

## 2015-07-19 ENCOUNTER — Encounter (HOSPITAL_COMMUNITY)
Admission: EM | Disposition: A | Discharge status: Home / Self Care | Payer: Self-pay | Source: Home / Self Care | Attending: Internal Medicine

## 2015-07-19 ENCOUNTER — Inpatient Hospital Stay (HOSPITAL_COMMUNITY): Payer: Self-pay

## 2015-07-19 DIAGNOSIS — I10 Essential (primary) hypertension: Secondary | ICD-10-CM

## 2015-07-19 DIAGNOSIS — E785 Hyperlipidemia, unspecified: Secondary | ICD-10-CM | POA: Diagnosis present

## 2015-07-19 DIAGNOSIS — I213 ST elevation (STEMI) myocardial infarction of unspecified site: Secondary | ICD-10-CM

## 2015-07-19 DIAGNOSIS — F17200 Nicotine dependence, unspecified, uncomplicated: Secondary | ICD-10-CM | POA: Diagnosis present

## 2015-07-19 DIAGNOSIS — I2510 Atherosclerotic heart disease of native coronary artery without angina pectoris: Secondary | ICD-10-CM

## 2015-07-19 HISTORY — PX: CARDIAC CATHETERIZATION: SHX172

## 2015-07-19 LAB — CBC
HCT: 51.4 % (ref 39.0–52.0)
Hemoglobin: 17.8 g/dL — ABNORMAL HIGH (ref 13.0–17.0)
MCH: 33.4 pg (ref 26.0–34.0)
MCHC: 34.6 g/dL (ref 30.0–36.0)
MCV: 96.4 fL (ref 78.0–100.0)
PLATELETS: 285 10*3/uL (ref 150–400)
RBC: 5.33 MIL/uL (ref 4.22–5.81)
RDW: 12.8 % (ref 11.5–15.5)
WBC: 10.2 10*3/uL (ref 4.0–10.5)

## 2015-07-19 LAB — HEPARIN LEVEL (UNFRACTIONATED): Heparin Unfractionated: 0.3 IU/mL (ref 0.30–0.70)

## 2015-07-19 LAB — ECHOCARDIOGRAM COMPLETE
Height: 73 in
Weight: 2832 oz

## 2015-07-19 LAB — BASIC METABOLIC PANEL
ANION GAP: 9 (ref 5–15)
BUN: 12 mg/dL (ref 6–20)
CALCIUM: 9.4 mg/dL (ref 8.9–10.3)
CO2: 25 mmol/L (ref 22–32)
Chloride: 95 mmol/L — ABNORMAL LOW (ref 101–111)
Creatinine, Ser: 1.05 mg/dL (ref 0.61–1.24)
GFR calc Af Amer: >60 mL/min (ref >60)
GLUCOSE: 114 mg/dL — AB (ref 65–99)
POTASSIUM: 4.1 mmol/L (ref 3.5–5.1)
Sodium: 129 mmol/L — ABNORMAL LOW (ref 135–145)

## 2015-07-19 LAB — HEMOGLOBIN A1C
HEMOGLOBIN A1C: 5.7 % — AB (ref 4.8–5.6)
MEAN PLASMA GLUCOSE: 117 mg/dL

## 2015-07-19 LAB — HIV ANTIBODY (ROUTINE TESTING W REFLEX): HIV SCREEN 4TH GENERATION: NONREACTIVE

## 2015-07-19 LAB — HEPATITIS C ANTIBODY (REFLEX): HCV Ab: <0.1 s/co ratio (ref 0.0–0.9)

## 2015-07-19 LAB — MAGNESIUM: MAGNESIUM: 2.5 mg/dL — AB (ref 1.7–2.4)

## 2015-07-19 LAB — PROTIME-INR
INR: 1.12 (ref 0.00–1.49)
PROTHROMBIN TIME: 14.6 s (ref 11.6–15.2)

## 2015-07-19 LAB — HCV COMMENT:

## 2015-07-19 LAB — TROPONIN I: TROPONIN I: 1.46 ng/mL — AB (ref <0.03)

## 2015-07-19 SURGERY — LEFT HEART CATH AND CORONARY ANGIOGRAPHY
Anesthesia: LOCAL

## 2015-07-19 MED ORDER — VERAPAMIL HCL 2.5 MG/ML IV SOLN
INTRAVENOUS | Status: DC | PRN
  Administered 2015-07-19: 15:00:00 via INTRA_ARTERIAL

## 2015-07-19 MED ORDER — VERAPAMIL HCL 2.5 MG/ML IV SOLN
INTRAVENOUS | Status: AC
  Filled 2015-07-19: qty 2

## 2015-07-19 MED ORDER — HEPARIN (PORCINE) IN NACL 2-0.9 UNIT/ML-% IJ SOLN
INTRAMUSCULAR | Status: AC
  Filled 2015-07-19: qty 1000

## 2015-07-19 MED ORDER — IOPAMIDOL (ISOVUE-370) INJECTION 76%
INTRAVENOUS | Status: DC | PRN
  Administered 2015-07-19: 60 mL via INTRAVENOUS

## 2015-07-19 MED ORDER — MAGNESIUM HYDROXIDE 400 MG/5ML PO SUSP
30.0000 mL | Freq: Every day | ORAL | Status: DC
  Administered 2015-07-19: 30 mL via ORAL
  Filled 2015-07-19 (×2): qty 30

## 2015-07-19 MED ORDER — LIDOCAINE HCL (PF) 1 % IJ SOLN
INTRAMUSCULAR | Status: DC | PRN
  Administered 2015-07-19: 5 mL

## 2015-07-19 MED ORDER — ONDANSETRON HCL 4 MG/2ML IJ SOLN
4.0000 mg | Freq: Four times a day (QID) | INTRAMUSCULAR | Status: DC | PRN
  Administered 2015-07-19: 4 mg via INTRAVENOUS
  Filled 2015-07-19: qty 2

## 2015-07-19 MED ORDER — LORAZEPAM 2 MG/ML IJ SOLN
1.0000 mg | Freq: Once | INTRAMUSCULAR | Status: AC
  Administered 2015-07-19: 1 mg via INTRAVENOUS
  Filled 2015-07-19: qty 1

## 2015-07-19 MED ORDER — FENTANYL CITRATE (PF) 100 MCG/2ML IJ SOLN
INTRAMUSCULAR | Status: DC | PRN
  Administered 2015-07-19: 25 ug via INTRAVENOUS

## 2015-07-19 MED ORDER — MIDAZOLAM HCL 2 MG/2ML IJ SOLN
INTRAMUSCULAR | Status: DC | PRN
  Administered 2015-07-19: 2 mg via INTRAVENOUS

## 2015-07-19 MED ORDER — SODIUM CHLORIDE 0.9% FLUSH: 3.0000 mL | INTRAVENOUS | Status: DC | PRN

## 2015-07-19 MED ORDER — LISINOPRIL 10 MG PO TABS
10.0000 mg | ORAL_TABLET | Freq: Every day | ORAL | Status: DC
  Administered 2015-07-19: 10 mg via ORAL
  Filled 2015-07-19: qty 1

## 2015-07-19 MED ORDER — SODIUM CHLORIDE 0.9 % WEIGHT BASED INFUSION: 1.0000 mL/kg/h | INTRAVENOUS | Status: AC

## 2015-07-19 MED ORDER — SODIUM CHLORIDE 0.9% FLUSH
3.0000 mL | Freq: Two times a day (BID) | INTRAVENOUS | Status: DC
  Administered 2015-07-20 – 2015-07-21 (×2): 3 mL via INTRAVENOUS

## 2015-07-19 MED ORDER — ACETAMINOPHEN 325 MG PO TABS: 650.0000 mg | ORAL_TABLET | ORAL | Status: DC | PRN

## 2015-07-19 MED ORDER — SODIUM CHLORIDE 0.9 % IV SOLN: 250.0000 mL | INTRAVENOUS | Status: DC | PRN

## 2015-07-19 MED ORDER — MIDAZOLAM HCL 2 MG/2ML IJ SOLN
INTRAMUSCULAR | Status: AC
  Filled 2015-07-19: qty 2

## 2015-07-19 MED ORDER — HEPARIN SODIUM (PORCINE) 1000 UNIT/ML IJ SOLN
INTRAMUSCULAR | Status: AC
  Filled 2015-07-19: qty 1

## 2015-07-19 MED ORDER — HEPARIN SODIUM (PORCINE) 1000 UNIT/ML IJ SOLN
INTRAMUSCULAR | Status: DC | PRN
  Administered 2015-07-19: 4000 [IU] via INTRAVENOUS

## 2015-07-19 MED ORDER — IOPAMIDOL (ISOVUE-370) INJECTION 76%
INTRAVENOUS | Status: AC
  Filled 2015-07-19: qty 100

## 2015-07-19 MED ORDER — LIDOCAINE HCL (PF) 1 % IJ SOLN
INTRAMUSCULAR | Status: AC
  Filled 2015-07-19: qty 30

## 2015-07-19 MED ORDER — HEPARIN SODIUM (PORCINE) 5000 UNIT/ML IJ SOLN
5000.0000 [IU] | Freq: Three times a day (TID) | INTRAMUSCULAR | Status: DC
  Administered 2015-07-19 – 2015-07-21 (×4): 5000 [IU] via SUBCUTANEOUS
  Filled 2015-07-19 (×4): qty 1

## 2015-07-19 MED ORDER — LIDOCAINE HCL (PF) 1 % IJ SOLN
INTRAMUSCULAR | Status: DC | PRN
  Administered 2015-07-19: 15:00:00

## 2015-07-19 MED ORDER — FENTANYL CITRATE (PF) 100 MCG/2ML IJ SOLN
INTRAMUSCULAR | Status: AC
  Filled 2015-07-19: qty 2

## 2015-07-19 SURGICAL SUPPLY — 11 items

## 2015-07-19 NOTE — Progress Notes (Signed)
SUBJECTIVE:  No chest pain.  No SOB   PHYSICAL EXAM Filed Vitals:   07/19/15 0300 07/19/15 0428 07/19/15 0500 07/19/15 0534  BP: 145/71  181/100 159/106  Pulse: 65  75 79  Temp:  98.6 F (37 C)    TempSrc:      Resp: 15   13  Height:      Weight:  177 lb (80.287 kg)    SpO2: 100%   100%   General:  No distress Lungs:  Clear Heart:  RRR Abdomen:  Positive bowel sounds, no rebound no guarding Extremities:  No edema  LABS: Lab Results  Component Value Date   TROPONINI 1.46* 07/19/2015   Results for orders placed or performed during the hospital encounter of 07/18/15 (from the past 24 hour(s))  Basic metabolic panel     Status: Abnormal   Collection Time: 07/18/15  1:45 PM  Result Value Ref Range   Sodium 132 (L) 135 - 145 mmol/L   Potassium 3.5 3.5 - 5.1 mmol/L   Chloride 97 (L) 101 - 111 mmol/L   CO2 25 22 - 32 mmol/L   Glucose, Bld 121 (H) 65 - 99 mg/dL   BUN 12 6 - 20 mg/dL   Creatinine, Ser 1.03 0.61 - 1.24 mg/dL   Calcium 9.6 8.9 - 10.3 mg/dL   GFR calc non Af Amer >60 >60 mL/min   GFR calc Af Amer >60 >60 mL/min   Anion gap 10 5 - 15  CBC     Status: Abnormal   Collection Time: 07/18/15  1:45 PM  Result Value Ref Range   WBC 8.4 4.0 - 10.5 K/uL   RBC 5.32 4.22 - 5.81 MIL/uL   Hemoglobin 17.8 (H) 13.0 - 17.0 g/dL   HCT 51.4 39.0 - 52.0 %   MCV 96.6 78.0 - 100.0 fL   MCH 33.5 26.0 - 34.0 pg   MCHC 34.6 30.0 - 36.0 g/dL   RDW 12.7 11.5 - 15.5 %   Platelets 282 150 - 400 K/uL  Hepatic function panel     Status: Abnormal   Collection Time: 07/18/15  1:45 PM  Result Value Ref Range   Total Protein 9.3 (H) 6.5 - 8.1 g/dL   Albumin 4.3 3.5 - 5.0 g/dL   AST 55 (H) 15 - 41 U/L   ALT 28 17 - 63 U/L   Alkaline Phosphatase 60 38 - 126 U/L   Total Bilirubin 1.0 0.3 - 1.2 mg/dL   Bilirubin, Direct <0.1 (L) 0.1 - 0.5 mg/dL   Indirect Bilirubin NOT CALCULATED 0.3 - 0.9 mg/dL  Lipase, blood     Status: None   Collection Time: 07/18/15  1:45 PM  Result Value  Ref Range   Lipase 19 11 - 51 U/L  I-stat troponin, ED     Status: Abnormal   Collection Time: 07/18/15  1:56 PM  Result Value Ref Range   Troponin i, poc 0.94 (HH) 0.00 - 0.08 ng/mL   Comment NOTIFIED PHYSICIAN    Comment 3          Urine rapid drug screen (hosp performed)     Status: Abnormal   Collection Time: 07/18/15  4:35 PM  Result Value Ref Range   Opiates NONE DETECTED NONE DETECTED   Cocaine NONE DETECTED NONE DETECTED   Benzodiazepines NONE DETECTED NONE DETECTED   Amphetamines NONE DETECTED NONE DETECTED   Tetrahydrocannabinol POSITIVE (A) NONE DETECTED   Barbiturates NONE DETECTED NONE DETECTED  Lipid panel  Status: Abnormal   Collection Time: 07/18/15  6:50 PM  Result Value Ref Range   Cholesterol 238 (H) 0 - 200 mg/dL   Triglycerides 81 <150 mg/dL   HDL 67 >40 mg/dL   Total CHOL/HDL Ratio 3.6 RATIO   VLDL 16 0 - 40 mg/dL   LDL Cholesterol 155 (H) 0 - 99 mg/dL  MRSA PCR Screening     Status: None   Collection Time: 07/18/15  6:50 PM  Result Value Ref Range   MRSA by PCR NEGATIVE NEGATIVE  Troponin I     Status: Abnormal   Collection Time: 07/18/15  7:47 PM  Result Value Ref Range   Troponin I 2.00 (HH) <0.03 ng/mL  Heparin level (unfractionated)     Status: Abnormal   Collection Time: 07/18/15 10:42 PM  Result Value Ref Range   Heparin Unfractionated 0.29 (L) 0.30 - 0.70 IU/mL  Troponin I     Status: Abnormal   Collection Time: 07/19/15  1:58 AM  Result Value Ref Range   Troponin I 1.46 (HH) <0.03 ng/mL  Basic metabolic panel     Status: Abnormal   Collection Time: 07/19/15  1:58 AM  Result Value Ref Range   Sodium 129 (L) 135 - 145 mmol/L   Potassium 4.1 3.5 - 5.1 mmol/L   Chloride 95 (L) 101 - 111 mmol/L   CO2 25 22 - 32 mmol/L   Glucose, Bld 114 (H) 65 - 99 mg/dL   BUN 12 6 - 20 mg/dL   Creatinine, Ser 1.05 0.61 - 1.24 mg/dL   Calcium 9.4 8.9 - 10.3 mg/dL   GFR calc non Af Amer >60 >60 mL/min   GFR calc Af Amer >60 >60 mL/min   Anion gap 9 5  - 15  CBC     Status: Abnormal   Collection Time: 07/19/15  1:58 AM  Result Value Ref Range   WBC 10.2 4.0 - 10.5 K/uL   RBC 5.33 4.22 - 5.81 MIL/uL   Hemoglobin 17.8 (H) 13.0 - 17.0 g/dL   HCT 51.4 39.0 - 52.0 %   MCV 96.4 78.0 - 100.0 fL   MCH 33.4 26.0 - 34.0 pg   MCHC 34.6 30.0 - 36.0 g/dL   RDW 12.8 11.5 - 15.5 %   Platelets 285 150 - 400 K/uL  Protime-INR     Status: None   Collection Time: 07/19/15  1:58 AM  Result Value Ref Range   Prothrombin Time 14.6 11.6 - 15.2 seconds   INR 1.12 0.00 - 1.49  Heparin level (unfractionated)     Status: None   Collection Time: 07/19/15  5:37 AM  Result Value Ref Range   Heparin Unfractionated 0.30 0.30 - 0.70 IU/mL    Intake/Output Summary (Last 24 hours) at 07/19/15 0806 Last data filed at 07/19/15 0700  Gross per 24 hour  Intake 1708.82 ml  Output    450 ml  Net 1258.82 ml    EKG:  NSR, rate 76, QT prolonged, axis WNL, intervals WNL, no acute ST T wave changes.  07/19/2015  ASSESSMENT AND PLAN:  NSTEMI:    Cath today.    HTN:   BP is still not well controlled.  Increase lisinopril to 10 mg.    TOBACCO:  Educated.    DYSLIPIDEMIA:    LDL 155.  Started on Lipitor.    HYPONATREMIA:   Follow.    PROLONGED QT:  No dysrhythmias.    Check magnesium level and follow EKGs.  Avoid QT  prolonging drugs.      Jakyree [facility] 07/19/2015 8:06 AM

## 2015-07-19 NOTE — Progress Notes (Signed)
ANTICOAGULATION CONSULT NOTE - Follow Up Consult  Pharmacy Consult for heparin Indication: NSTEMI  Labs:  Recent Labs  07/18/15 1345 07/18/15 1947 07/18/15 2242 07/19/15 0158 07/19/15 0537  HGB 17.8*  --   --  17.8*  --   HCT 51.4  --   --  51.4  --   PLT 282  --   --  285  --   LABPROT  --   --   --  14.6  --   INR  --   --   --  1.12  --   HEPARINUNFRC  --   --  0.29*  --  0.30  CREATININE 1.03  --   --  1.05  --   TROPONINI  --  2.00*  --  1.46*  --     Assessment: 60yo male on therapeutic heparin for NSTEMI. No bleeding noted. Cath scheduled for 1330.  Goal of Therapy:  Heparin level 0.3-0.7 units/ml   Plan:  Continue heparin at 1100 units/hr Will f/u post cath.  Sherlon Handing, PharmD, BCPS Clinical pharmacist, pager [phone] 07/19/2015,6:40 AM

## 2015-07-19 NOTE — Progress Notes (Addendum)
Blood Pressure taken by automatic cuff is generally not accurate.  Two manual checks this afternoon and noted in flow sheets were 160/100 and 146/96.  Only manual BP should be done going forward.

## 2015-07-19 NOTE — H&P (View-Only) (Signed)
SUBJECTIVE:  No chest pain.  No SOB   PHYSICAL EXAM Filed Vitals:   07/19/15 0300 07/19/15 0428 07/19/15 0500 07/19/15 0534  BP: 145/71  181/100 159/106  Pulse: 65  75 79  Temp:  98.6 F (37 C)    TempSrc:      Resp: 15   13  Height:      Weight:  177 lb (80.287 kg)    SpO2: 100%   100%   General:  No distress Lungs:  Clear Heart:  RRR Abdomen:  Positive bowel sounds, no rebound no guarding Extremities:  No edema  LABS: Lab Results  Component Value Date   TROPONINI 1.46* 07/19/2015   Results for orders placed or performed during the hospital encounter of 07/18/15 (from the past 24 hour(s))  Basic metabolic panel     Status: Abnormal   Collection Time: 07/18/15  1:45 PM  Result Value Ref Range   Sodium 132 (L) 135 - 145 mmol/L   Potassium 3.5 3.5 - 5.1 mmol/L   Chloride 97 (L) 101 - 111 mmol/L   CO2 25 22 - 32 mmol/L   Glucose, Bld 121 (H) 65 - 99 mg/dL   BUN 12 6 - 20 mg/dL   Creatinine, Ser 1.03 0.61 - 1.24 mg/dL   Calcium 9.6 8.9 - 10.3 mg/dL   GFR calc non Af Amer >60 >60 mL/min   GFR calc Af Amer >60 >60 mL/min   Anion gap 10 5 - 15  CBC     Status: Abnormal   Collection Time: 07/18/15  1:45 PM  Result Value Ref Range   WBC 8.4 4.0 - 10.5 K/uL   RBC 5.32 4.22 - 5.81 MIL/uL   Hemoglobin 17.8 (H) 13.0 - 17.0 g/dL   HCT 51.4 39.0 - 52.0 %   MCV 96.6 78.0 - 100.0 fL   MCH 33.5 26.0 - 34.0 pg   MCHC 34.6 30.0 - 36.0 g/dL   RDW 12.7 11.5 - 15.5 %   Platelets 282 150 - 400 K/uL  Hepatic function panel     Status: Abnormal   Collection Time: 07/18/15  1:45 PM  Result Value Ref Range   Total Protein 9.3 (H) 6.5 - 8.1 g/dL   Albumin 4.3 3.5 - 5.0 g/dL   AST 55 (H) 15 - 41 U/L   ALT 28 17 - 63 U/L   Alkaline Phosphatase 60 38 - 126 U/L   Total Bilirubin 1.0 0.3 - 1.2 mg/dL   Bilirubin, Direct <0.1 (L) 0.1 - 0.5 mg/dL   Indirect Bilirubin NOT CALCULATED 0.3 - 0.9 mg/dL  Lipase, blood     Status: None   Collection Time: 07/18/15  1:45 PM  Result Value  Ref Range   Lipase 19 11 - 51 U/L  I-stat troponin, ED     Status: Abnormal   Collection Time: 07/18/15  1:56 PM  Result Value Ref Range   Troponin i, poc 0.94 (HH) 0.00 - 0.08 ng/mL   Comment NOTIFIED PHYSICIAN    Comment 3          Urine rapid drug screen (hosp performed)     Status: Abnormal   Collection Time: 07/18/15  4:35 PM  Result Value Ref Range   Opiates NONE DETECTED NONE DETECTED   Cocaine NONE DETECTED NONE DETECTED   Benzodiazepines NONE DETECTED NONE DETECTED   Amphetamines NONE DETECTED NONE DETECTED   Tetrahydrocannabinol POSITIVE (A) NONE DETECTED   Barbiturates NONE DETECTED NONE DETECTED  Lipid panel  Status: Abnormal   Collection Time: 07/18/15  6:50 PM  Result Value Ref Range   Cholesterol 238 (H) 0 - 200 mg/dL   Triglycerides 81 <150 mg/dL   HDL 67 >40 mg/dL   Total CHOL/HDL Ratio 3.6 RATIO   VLDL 16 0 - 40 mg/dL   LDL Cholesterol 155 (H) 0 - 99 mg/dL  MRSA PCR Screening     Status: None   Collection Time: 07/18/15  6:50 PM  Result Value Ref Range   MRSA by PCR NEGATIVE NEGATIVE  Troponin I     Status: Abnormal   Collection Time: 07/18/15  7:47 PM  Result Value Ref Range   Troponin I 2.00 (HH) <0.03 ng/mL  Heparin level (unfractionated)     Status: Abnormal   Collection Time: 07/18/15 10:42 PM  Result Value Ref Range   Heparin Unfractionated 0.29 (L) 0.30 - 0.70 IU/mL  Troponin I     Status: Abnormal   Collection Time: 07/19/15  1:58 AM  Result Value Ref Range   Troponin I 1.46 (HH) <0.03 ng/mL  Basic metabolic panel     Status: Abnormal   Collection Time: 07/19/15  1:58 AM  Result Value Ref Range   Sodium 129 (L) 135 - 145 mmol/L   Potassium 4.1 3.5 - 5.1 mmol/L   Chloride 95 (L) 101 - 111 mmol/L   CO2 25 22 - 32 mmol/L   Glucose, Bld 114 (H) 65 - 99 mg/dL   BUN 12 6 - 20 mg/dL   Creatinine, Ser 1.05 0.61 - 1.24 mg/dL   Calcium 9.4 8.9 - 10.3 mg/dL   GFR calc non Af Amer >60 >60 mL/min   GFR calc Af Amer >60 >60 mL/min   Anion gap 9 5  - 15  CBC     Status: Abnormal   Collection Time: 07/19/15  1:58 AM  Result Value Ref Range   WBC 10.2 4.0 - 10.5 K/uL   RBC 5.33 4.22 - 5.81 MIL/uL   Hemoglobin 17.8 (H) 13.0 - 17.0 g/dL   HCT 51.4 39.0 - 52.0 %   MCV 96.4 78.0 - 100.0 fL   MCH 33.4 26.0 - 34.0 pg   MCHC 34.6 30.0 - 36.0 g/dL   RDW 12.8 11.5 - 15.5 %   Platelets 285 150 - 400 K/uL  Protime-INR     Status: None   Collection Time: 07/19/15  1:58 AM  Result Value Ref Range   Prothrombin Time 14.6 11.6 - 15.2 seconds   INR 1.12 0.00 - 1.49  Heparin level (unfractionated)     Status: None   Collection Time: 07/19/15  5:37 AM  Result Value Ref Range   Heparin Unfractionated 0.30 0.30 - 0.70 IU/mL    Intake/Output Summary (Last 24 hours) at 07/19/15 0806 Last data filed at 07/19/15 0700  Gross per 24 hour  Intake 1708.82 ml  Output    450 ml  Net 1258.82 ml    EKG:  NSR, rate 76, QT prolonged, axis WNL, intervals WNL, no acute ST T wave changes.  07/19/2015  ASSESSMENT AND PLAN:  NSTEMI:    Cath today.    HTN:   BP is still not well controlled.  Increase lisinopril to 10 mg.    TOBACCO:  Educated.    DYSLIPIDEMIA:    LDL 155.  Started on Lipitor.    HYPONATREMIA:   Follow.    PROLONGED QT:  No dysrhythmias.    Check magnesium level and follow EKGs.  Avoid QT  prolonging drugs.      Janthony [facility] 07/19/2015 8:06 AM

## 2015-07-19 NOTE — Progress Notes (Signed)
Subjective: Nitro gtt started overnight for ongoing chest pain and hypertension, also received a dose of ativan for pre-procedure anxiety this morning. No chest pain, shortness of breath, nausea controlled. Trops trending down. Going for [facility] cath today. During out visit bp elevated at 150/100, 100% on 4L [place], turned down to 1-2L, still 100% - recommend weaning to room air. Some tachycardia and decreased RR overnight but no documented arrhythmias on telemetry.  Objective: Vital signs in last 24 hours: Filed Vitals:   07/19/15 0428 07/19/15 0500 07/19/15 0534 07/19/15 0850  BP:  181/100 159/106 142/109  Pulse:  75 79 78  Temp: 98.6 F (37 C)   98.4 F (36.9 C)  TempSrc:    Oral  Resp:   13 12  Height:      Weight: 80.287 kg (177 lb)     SpO2:   100% 100%    Intake/Output Summary (Last 24 hours) at 07/19/15 0944 Last data filed at 07/19/15 [id number]  Gross per 24 hour  Intake 1708.82 ml  Output    700 ml  Net 1008.82 ml   Physical Exam  General appearance: Gentleman resting in bed, mild distress, appears older than stated age HENT: Normocephalic, atraumatic, poor dentition, no audible carotid bruits, no JVD, trachea midline Cardiovascular: Regular rate and rhythm, no audible murmurs, rubs, gallops Respiratory: Clear to auscultation bilaterally, normal work of breathing Abdomen: Soft, bowel sounds present, non-tender, non-distended Skin: Warm, dry intact Extremities: Normal bulk and ROM, no edema, DP/PT 2+ bilaterally Neuro: Cranial nerves grossly intact, alert and oriented Psych: Appropriate affect  Labs / Imaging / Procedures: CBC Latest Ref Rng 07/19/2015 07/18/2015 07/01/2007  WBC 4.0 - 10.5 K/uL 10.2 8.4 8.6  Hemoglobin 13.0 - 17.0 g/dL 17.8(H) 17.8(H) 17.4(H)  Hematocrit 39.0 - 52.0 % 51.4 51.4 51.3  Platelets 150 - 400 K/uL 285 282 231   BMP Latest Ref Rng 07/19/2015 07/18/2015 07/01/2007  Glucose 65 - 99 mg/dL 114(H) 121(H) 118(H)  BUN 6 - 20 mg/dL 12 12 12   Creatinine 0.61  - 1.24 mg/dL 1.05 1.03 1.01  Sodium 135 - 145 mmol/L 129(L) 132(L) 136  Potassium 3.5 - 5.1 mmol/L 4.1 3.5 3.6  Chloride 101 - 111 mmol/L 95(L) 97(L) 99  CO2 22 - 32 mmol/L 25 25 26   Calcium 8.9 - 10.3 mg/dL 9.4 9.6 10.0      Ref Range  1:58 AM  1d ago   Troponin I <0.03 ng/mL 1.46 (HH) 2.00 (HH)CM        Ref Range 1d ago    Hgb A1c MFr Bld 4.8 - 5.6 % 5.7 (H)        Lipid Panel     Component Value Date/Time   CHOL 238* 07/18/2015 1850   TRIG 81 07/18/2015 1850   HDL 67 07/18/2015 1850   CHOLHDL 3.6 07/18/2015 1850   VLDL 16 07/18/2015 1850   LDLCALC 155* 07/18/2015 1850   Dg Chest 2 View  07/18/2015  CLINICAL DATA:  Chest pain, shortness of breath. EXAM: CHEST  2 VIEW COMPARISON:  None. FINDINGS: The heart size and mediastinal contours are within normal limits. Both lungs are clear. No pneumothorax or pleural effusion is noted. The visualized skeletal structures are unremarkable. IMPRESSION: No active cardiopulmonary disease. Electronically Signed   By: Marijo Conception, M.D.   On: 07/18/2015 13:27    Assessment/Plan: Mr. Adamic is a 60 year old gentleman with no documented PMH but minimal healthcare contact who presents with chest pain andNSTEMI.  1. NSTEMI, no ischemic EKG changes, serial trops 0.94, 2, 1.46, patient with untreated HTN, smoking history - Heparin gtt  - Nitro gtt - Telemetry - NPO at midnight for cath today, follow results  - TTE scheduled - Asa 325 qd - Metoprolol 25mg  BID - Lisinopril 5mg  QD - Atorvastatin 40mg  - Misc cardiac:  - Elevated cholesterol and LDL on lipid panel - HbA1c 5.7 - UDS only pos for MJ  2. Hypertension, 150s/100s on exam - Starting Metoprolol and Lisinopril  - Nitro gtt  3. LVH, cardiac function undetermined - TEE  scheduled  4. Preventative health - HIV pending - HepCV pending  Dispo: Anticipated discharge in approximately 2-3 day(s).     Asencion Partridge, MD 07/19/2015, 9:44 AM Pager: [phone]

## 2015-07-19 NOTE — Significant Event (Signed)
Patient has been transferred to [facility], report called to receiving RN. No personal belongings at bedside of [place] that could be taken to new room. Cadden Elizondo, Therapist, sports.

## 2015-07-19 NOTE — Progress Notes (Signed)
Md notified.  Pt very anxious about procedure.  Refusing Groin prep.  Pt stated " nobody's  Going  to cut on me"  Educated pt.  New orders received.  1mg   Ativan given.  Will continue to monitor. Saunders Revel T

## 2015-07-19 NOTE — Interval H&P Note (Signed)
Cath Lab Visit (complete for each Cath Lab visit)  Clinical Evaluation Leading to the Procedure:   ACS: Yes.    Non-ACS:    Anginal Classification: CCS IV  Anti-ischemic medical therapy: Minimal Therapy (1 class of medications)  Non-Invasive Test Results: No non-invasive testing performed  Prior CABG: No previous CABG   Willing to take antiplatelet therapy for a year.   History and Physical Interval Note:  07/19/2015 2:48 PM  Andres Boyd  has presented today for surgery, with the diagnosis of NSTEMI  The various methods of treatment have been discussed with the patient and family. After consideration of risks, benefits and other options for treatment, the patient has consented to  Procedure(s): Left Heart Cath and Coronary Angiography (N/A) as a surgical intervention .  The patient's history has been reviewed, patient examined, no change in status, stable for surgery.  I have reviewed the patient's chart and labs.  Questions were answered to the patient's satisfaction.     Larae Grooms

## 2015-07-19 NOTE — Progress Notes (Signed)
TR BAND REMOVAL  LOCATION:    right radial  DEFLATED PER PROTOCOL:    Yes.    TIME BAND OFF / DRESSING APPLIED:    1830   SITE UPON ARRIVAL:    Level 0  SITE AFTER BAND REMOVAL:    Level 0  CIRCULATION SENSATION AND MOVEMENT:    Within Normal Limits   Yes.    COMMENTS:    

## 2015-07-19 NOTE — Progress Notes (Signed)
  Echocardiogram 2D Echocardiogram has been performed.  Andres Boyd 07/19/2015, 1:10 PM

## 2015-07-20 ENCOUNTER — Encounter (HOSPITAL_COMMUNITY): Payer: Self-pay | Admitting: Interventional Cardiology

## 2015-07-20 DIAGNOSIS — I959 Hypotension, unspecified: Secondary | ICD-10-CM

## 2015-07-20 DIAGNOSIS — I2510 Atherosclerotic heart disease of native coronary artery without angina pectoris: Secondary | ICD-10-CM | POA: Diagnosis present

## 2015-07-20 LAB — CBC
HEMATOCRIT: 50.8 % (ref 39.0–52.0)
Hemoglobin: 17.8 g/dL — ABNORMAL HIGH (ref 13.0–17.0)
MCH: 34 pg (ref 26.0–34.0)
MCHC: 35 g/dL (ref 30.0–36.0)
MCV: 96.9 fL (ref 78.0–100.0)
Platelets: 266 10*3/uL (ref 150–400)
RBC: 5.24 MIL/uL (ref 4.22–5.81)
RDW: 12.5 % (ref 11.5–15.5)
WBC: 7.2 10*3/uL (ref 4.0–10.5)

## 2015-07-20 LAB — BASIC METABOLIC PANEL
Anion gap: 9 (ref 5–15)
BUN: 14 mg/dL (ref 6–20)
CALCIUM: 9.1 mg/dL (ref 8.9–10.3)
CHLORIDE: 95 mmol/L — AB (ref 101–111)
CO2: 27 mmol/L (ref 22–32)
CREATININE: 1.16 mg/dL (ref 0.61–1.24)
GFR calc non Af Amer: >60 mL/min (ref >60)
Glucose, Bld: 98 mg/dL (ref 65–99)
Potassium: 3.9 mmol/L (ref 3.5–5.1)
SODIUM: 131 mmol/L — AB (ref 135–145)

## 2015-07-20 MED ORDER — ANGIOPLASTY BOOK
Freq: Once | Status: AC
  Administered 2015-07-20: 21:00:00
  Filled 2015-07-20: qty 1

## 2015-07-20 MED ORDER — HEART ATTACK BOUNCING BOOK
Freq: Once | Status: AC
  Administered 2015-07-20: 21:00:00
  Filled 2015-07-20: qty 1

## 2015-07-20 MED ORDER — LISINOPRIL 5 MG PO TABS: 5.0000 mg | ORAL_TABLET | Freq: Every day | ORAL | Status: DC

## 2015-07-20 MED ORDER — ASPIRIN 81 MG PO TBEC: 81.0000 mg | DELAYED_RELEASE_TABLET | Freq: Every day | ORAL | Status: DC

## 2015-07-20 MED ORDER — SODIUM CHLORIDE 0.9 % IV BOLUS (SEPSIS)
500.0000 mL | Freq: Once | INTRAVENOUS | Status: AC
  Administered 2015-07-20: 09:00:00 500 mL via INTRAVENOUS

## 2015-07-20 MED ORDER — NITROGLYCERIN 0.4 MG SL SUBL: 0.4000 mg | SUBLINGUAL_TABLET | SUBLINGUAL | Status: DC | PRN

## 2015-07-20 MED ORDER — LISINOPRIL 5 MG PO TABS
5.0000 mg | ORAL_TABLET | Freq: Every day | ORAL | Status: DC
  Administered 2015-07-20: 5 mg via ORAL
  Filled 2015-07-20: qty 1

## 2015-07-20 MED ORDER — ATORVASTATIN CALCIUM 40 MG PO TABS: 40.0000 mg | ORAL_TABLET | Freq: Every day | ORAL | Status: DC

## 2015-07-20 MED ORDER — SODIUM CHLORIDE 0.9 % IV BOLUS (SEPSIS)
1000.0000 mL | Freq: Once | INTRAVENOUS | Status: AC
  Administered 2015-07-20: 13:00:00 1000 mL via INTRAVENOUS

## 2015-07-20 MED ORDER — SODIUM CHLORIDE 0.9 % IV BOLUS (SEPSIS)
1000.0000 mL | Freq: Once | INTRAVENOUS | Status: AC
  Administered 2015-07-20: 20:00:00 1000 mL via INTRAVENOUS

## 2015-07-20 NOTE — Progress Notes (Signed)
SUBJECTIVE:  Orthostatic BP drop earlier with ambulation.  No pain   PHYSICAL EXAM Filed Vitals:   07/19/15 2046 07/19/15 2315 07/20/15 0450 07/20/15 0750  BP: 154/79 154/79 133/97 118/72  Pulse: 83 86 61 103  Temp: 98.6 F (37 C)  98.8 F (37.1 C) 97.6 F (36.4 C)  TempSrc: Oral  Oral Oral  Resp: 13  17 13   Height:      Weight:   176 lb 5.9 oz (80 kg)   SpO2: 100%  98% 97%   General:  No distress Lungs:  Clear Heart:  RRR Abdomen:  Positive bowel sounds, no rebound no guarding Extremities:  No edema, right wrist without bleeding or bruising.   LABS: Lab Results  Component Value Date   TROPONINI 1.46* 07/19/2015   Results for orders placed or performed during the hospital encounter of 07/18/15 (from the past 24 hour(s))  CBC     Status: Abnormal   Collection Time: 07/20/15  4:50 AM  Result Value Ref Range   WBC 7.2 4.0 - 10.5 K/uL   RBC 5.24 4.22 - 5.81 MIL/uL   Hemoglobin 17.8 (H) 13.0 - 17.0 g/dL   HCT 50.8 39.0 - 52.0 %   MCV 96.9 78.0 - 100.0 fL   MCH 34.0 26.0 - 34.0 pg   MCHC 35.0 30.0 - 36.0 g/dL   RDW 12.5 11.5 - 15.5 %   Platelets 266 150 - 400 K/uL  Basic metabolic panel     Status: Abnormal   Collection Time: 07/20/15  4:50 AM  Result Value Ref Range   Sodium 131 (L) 135 - 145 mmol/L   Potassium 3.9 3.5 - 5.1 mmol/L   Chloride 95 (L) 101 - 111 mmol/L   CO2 27 22 - 32 mmol/L   Glucose, Bld 98 65 - 99 mg/dL   BUN 14 6 - 20 mg/dL   Creatinine, Ser 1.16 0.61 - 1.24 mg/dL   Calcium 9.1 8.9 - 10.3 mg/dL   GFR calc non Af Amer >60 >60 mL/min   GFR calc Af Amer >60 >60 mL/min   Anion gap 9 5 - 15    Intake/Output Summary (Last 24 hours) at 07/20/15 0908 Last data filed at 07/20/15 0750  Gross per 24 hour  Intake 1352.32 ml  Output   1650 ml  Net -297.68 ml    CATH   Mid RCA lesion, 25% stenosed.  Ramus lesion, 25% stenosed.  Ost 2nd Diag to 2nd Diag lesion, 50% stenosed.  Prox Cx lesion, 25% stenosed.  There is mild left ventricular  systolic dysfunction with hypokinesis noted at the base and normal contraction at the apex. Could be a Takatsubo variant.  ASSESSMENT AND PLAN:  NSTEMI:    Cath yesterday.  Mild plaque.  Mildly reduced EF.   Medical management.   HTN:   BP is still not well controlled supine.  Increased lisinopril to 10 mg.   However, it appears that some of these machine readings might have been overestimated.  BP is probably better controlled supine than reported.  I would still consider sending home on at least 5 mg of Lisinopril.  TOBACCO:  Educated.    DYSLIPIDEMIA:    LDL 155.  Started on Lipitor.    HYPONATREMIA:   Improved.  Can continue to follow as an out patient.   PROLONGED QT:  No dysrhythmias.      Avoid QT prolonging drugs.      [facility] 07/20/2015 9:08 AM

## 2015-07-20 NOTE — Progress Notes (Signed)
Subjective: No chest pain or dyspnea this morning. Mild intermittent nausea continues but says that eating breakfast has made him feel better and that he ate/drink very little yesterday. Per RN he has been dizzy on standing, manual pressures low, orthostatics pending. Will reassess after 500cc bolus, reduced antihypertensive meds. Likely discharge this afternoon.  Objective: Vital signs in last 24 hours: Filed Vitals:   07/19/15 2046 07/19/15 2315 07/20/15 0450 07/20/15 0750  BP: 154/79 154/79 133/97 118/72  Pulse: 83 86 61 103  Temp: 98.6 F (37 C)  98.8 F (37.1 C) 97.6 F (36.4 C)  TempSrc: Oral  Oral Oral  Resp: 13  17 13   Height:      Weight:   80 kg (176 lb 5.9 oz)   SpO2: 100%  98% 97%    Intake/Output Summary (Last 24 hours) at 07/20/15 0836 Last data filed at 07/20/15 0750  Gross per 24 hour  Intake 1445.52 ml  Output   1900 ml  Net -454.48 ml   Physical Exam  General appearance: Gentleman resting in bed, mild distress, appears older than stated age HENT: Normocephalic, atraumatic, poor dentition, no audible carotid bruits, no JVD, trachea midline Cardiovascular: Regular rate and rhythm, no audible murmurs, rubs, gallops Respiratory: Clear to auscultation bilaterally, normal work of breathing Abdomen: Soft, bowel sounds present, non-tender, non-distended Skin: Warm, dry intact Extremities: Normal bulk and ROM, no edema, DP/PT 2+ bilaterally Neuro: Cranial nerves grossly intact, alert and oriented Psych: Appropriate affect  Labs / Imaging / Procedures: CBC Latest Ref Rng 07/20/2015 07/19/2015 07/18/2015  WBC 4.0 - 10.5 K/uL 7.2 10.2 8.4  Hemoglobin 13.0 - 17.0 g/dL 17.8(H) 17.8(H) 17.8(H)  Hematocrit 39.0 - 52.0 % 50.8 51.4 51.4  Platelets 150 - 400 K/uL 266 285 282   BMP Latest Ref Rng 07/20/2015 07/19/2015 07/18/2015  Glucose 65 - 99 mg/dL 98 114(H) 121(H)  BUN 6 - 20 mg/dL 14 12 12   Creatinine 0.61 - 1.24 mg/dL 1.16 1.05 1.03  Sodium 135 - 145 mmol/L 131(L)  129(L) 132(L)  Potassium 3.5 - 5.1 mmol/L 3.9 4.1 3.5  Chloride 101 - 111 mmol/L 95(L) 95(L) 97(L)  CO2 22 - 32 mmol/L 27 25 25   Calcium 8.9 - 10.3 mg/dL 9.1 9.4 9.6   Left Heart Cath and Coronary Angiography - 07/19/15  Mid RCA lesion, 25% stenosed.  Ramus lesion, 25% stenosed.  Ost 2nd Diag to 2nd Diag lesion, 50% stenosed.  Prox Cx lesion, 25% stenosed.  There is mild left ventricular systolic dysfunction with hypokinesis noted at the base and normal contraction at the apex. Could be a Takatsubo variant.  No clear culprit lesion for elevated troponin. Continue medical therapy including blood pressure control and tobacco cessation.  TTE - 07/19/15 Study Conclusions - Left ventricle: There is akinesis of the basal and mid inferior  and inferoseptal walls. The cavity size was normal. There was  mild concentric hypertrophy. Systolic function was mildly to  moderately reduced. The estimated ejection fraction was in the  range of 40% to 45%. Doppler parameters are consistent with  abnormal left ventricular relaxation (grade 1 diastolic  dysfunction). There was no evidence of elevated ventricular  filling pressure by Doppler parameters. - Aortic valve: Trileaflet; normal thickness leaflets. There was no  regurgitation. - Mitral valve: Structurally normal valve. There was trivial  regurgitation. - Left atrium: The atrium was normal in size. - Right ventricle: Systolic function was normal. - Right atrium: The atrium was normal in size. - Tricuspid valve: There  was trivial regurgitation. - Pulmonic valve: There was no regurgitation. - Pulmonary arteries: Systolic pressure was within the normal  range. - Inferior vena cava: The vessel was normal in size. - Pericardium, extracardiac: There was no pericardial effusion.  Impressions: - CAD in the RCA territory  Assessment/Plan: Mr. Wenner is a 60 year old gentleman with no documented PMH but minimal healthcare contact  who presents with chest pain andNSTEMI.  1. NSTEMI, no ischemic EKG changes, serial trops 0.94, 2, 1.46, patient with untreated HTN, smoking history, cath revealed no culprit lesions and TTE LVEF 40-45%, basal hypokinesis, with grade 1 diastolic dysfunction - Asa 325 qd - Lisinopril 5mg  QD - Atorvastatin 40mg  - Misc cardiac:  - Elevated cholesterol and LDL on lipid panel - HbA1c 5.7 - UDS only pos for MJ  2. Hypotension, likely poor po intake in setting of starting bp meds - 500cc NS bolus this AM  - Dc'd nitro gtt, dc'd metoprolol and reduced lisinopril to 5mg  QD per cards recs  3. Preventative health - HIV neg - HepCV neg  Dispo: Anticipated discharge today with cardiology and [facility] follow up.   LOS: 1 day   Asencion Partridge, MD 07/20/2015, 8:36 AM Pager: [phone]

## 2015-07-20 NOTE — Progress Notes (Signed)
CARDIAC REHAB PHASE I   PRE:  Rate/Rhythm: 98 SR  BP:  Sitting: 84/62 manual L arm        SaO2: 96 RA  MODE:  Ambulation: 0 ft, attempted to stand, c/o severe dizziness   POST:  Rate/Rhythm: 107 ST  BP:  Lying: 79/54 manual L arm        SaO2:   Pt sitting on edge of bed, BP low with manual check in L arm. Pt attempted to stand, c/o sudden severe dizziness, loss of balance, assisted pt to lying position, BP 79/54 (left arm, manual). RN notified, will hold ambulation at this time. Dizziness improved with rest. Completed MI education.  Reviewed risk factors, tobacco/ETOH cessation (gave pt fake cigarette), MI book, anti-platelet therapy, activity restrictions, ntg, exercise, heart healthy diet, and phase 2 cardiac rehab. Pt verbalized understanding, receptive to education. Pt agrees to phase 2 cardiac rehab referral, will send to [facility] per pt request. Pt in bed, call bell within reach. Will follow-up tomorrow if pt does not discharge today.  [id number] Lenna Sciara, RN, BSN 07/20/2015 8:43 AM

## 2015-07-20 NOTE — Care Management Note (Signed)
Case Management Note  Patient Details  Name: Lieutenant Palermo MRN: [id number] Date of Birth: 1955-04-28  Subjective/Objective:   Patient states he lives with his two sons, who helps him at home, he is indep.he has pcp ,Dr. Darliss Ridgel at the [facility].  Patient is on asa and beta prostate at home.  NCM will cont to follow for dc needs.                  Action/Plan:   Expected Discharge Date:  07/20/15               Expected Discharge Plan:  Home/Self Care  In-House Referral:     Discharge planning Services  CM Consult  Post Acute Care Choice:    Choice offered to:     DME Arranged:    DME Agency:     HH Arranged:    HH Agency:     Status of Service:  In process, will continue to follow  If discussed at Long Length of Stay Meetings, dates discussed:    Additional Comments:  Zenon Mayo, RN 07/20/2015, 2:32 PM

## 2015-07-21 ENCOUNTER — Inpatient Hospital Stay (HOSPITAL_COMMUNITY): Payer: Self-pay

## 2015-07-21 DIAGNOSIS — I4581 Long QT syndrome: Secondary | ICD-10-CM

## 2015-07-21 DIAGNOSIS — I519 Heart disease, unspecified: Secondary | ICD-10-CM

## 2015-07-21 DIAGNOSIS — F17200 Nicotine dependence, unspecified, uncomplicated: Secondary | ICD-10-CM

## 2015-07-21 DIAGNOSIS — F1010 Alcohol abuse, uncomplicated: Secondary | ICD-10-CM | POA: Insufficient documentation

## 2015-07-21 DIAGNOSIS — Z87898 Personal history of other specified conditions: Secondary | ICD-10-CM

## 2015-07-21 DIAGNOSIS — I5189 Other ill-defined heart diseases: Secondary | ICD-10-CM

## 2015-07-21 DIAGNOSIS — F1011 Alcohol abuse, in remission: Secondary | ICD-10-CM | POA: Insufficient documentation

## 2015-07-21 DIAGNOSIS — I959 Hypotension, unspecified: Secondary | ICD-10-CM | POA: Diagnosis not present

## 2015-07-21 LAB — CBC
HCT: 40.1 % (ref 39.0–52.0)
HCT: 42.5 % (ref 39.0–52.0)
Hemoglobin: 13.6 g/dL (ref 13.0–17.0)
Hemoglobin: 14.1 g/dL (ref 13.0–17.0)
MCH: 32.9 pg (ref 26.0–34.0)
MCH: 32.8 pg (ref 26.0–34.0)
MCHC: 33.9 g/dL (ref 30.0–36.0)
MCHC: 33.2 g/dL (ref 30.0–36.0)
MCV: 97.1 fL (ref 78.0–100.0)
MCV: 98.8 fL (ref 78.0–100.0)
PLATELETS: 200 10*3/uL (ref 150–400)
PLATELETS: 221 10*3/uL (ref 150–400)
RBC: 4.13 MIL/uL — ABNORMAL LOW (ref 4.22–5.81)
RBC: 4.3 MIL/uL (ref 4.22–5.81)
RDW: 12.6 % (ref 11.5–15.5)
RDW: 12.6 % (ref 11.5–15.5)
WBC: 5.7 10*3/uL (ref 4.0–10.5)
WBC: 6.6 10*3/uL (ref 4.0–10.5)

## 2015-07-21 LAB — BASIC METABOLIC PANEL
Anion gap: 4 — ABNORMAL LOW (ref 5–15)
BUN: 16 mg/dL (ref 6–20)
CHLORIDE: 105 mmol/L (ref 101–111)
CO2: 24 mmol/L (ref 22–32)
CREATININE: 1.16 mg/dL (ref 0.61–1.24)
Calcium: 7.8 mg/dL — ABNORMAL LOW (ref 8.9–10.3)
GFR calc Af Amer: >60 mL/min (ref >60)
GLUCOSE: 103 mg/dL — AB (ref 65–99)
Potassium: 3.4 mmol/L — ABNORMAL LOW (ref 3.5–5.1)
SODIUM: 133 mmol/L — AB (ref 135–145)

## 2015-07-21 MED ORDER — SODIUM CHLORIDE 0.9 % IV BOLUS (SEPSIS)
1000.0000 mL | Freq: Once | INTRAVENOUS | Status: AC
  Administered 2015-07-21: 1000 mL via INTRAVENOUS

## 2015-07-21 MED ORDER — SODIUM CHLORIDE 0.9 % IV BOLUS (SEPSIS)
1000.0000 mL | Freq: Once | INTRAVENOUS | Status: AC
  Administered 2015-07-21: 01:00:00 1000 mL via INTRAVENOUS

## 2015-07-21 MED ORDER — POTASSIUM CHLORIDE CRYS ER 20 MEQ PO TBCR
40.0000 meq | EXTENDED_RELEASE_TABLET | Freq: Once | ORAL | Status: AC
  Administered 2015-07-21: 40 meq via ORAL
  Filled 2015-07-21: qty 2

## 2015-07-21 NOTE — Discharge Instructions (Signed)
Aspirin and Your Heart  Aspirin is a medicine that affects the way blood clots. Aspirin can be used to help reduce the risk of blood clots, heart attacks, and other heart-related problems.  SHOULD I TAKE ASPIRIN? Your health care provider will help you determine whether it is safe and beneficial for you to take aspirin daily. Taking aspirin daily may be beneficial if you:  Have had a heart attack or chest pain.  Have undergone open heart surgery such as coronary artery bypass surgery (CABG).  Have had coronary angioplasty.  Have experienced a stroke or transient ischemic attack (TIA).  Have peripheral vascular disease (PVD).  Have chronic heart rhythm problems such as atrial fibrillation. ARE THERE ANY RISKS OF TAKING ASPIRIN DAILY? Daily use of aspirin can increase your risk of side effects. Some of these include:  Bleeding. Bleeding problems can be minor or serious. An example of a minor problem is a cut that does not stop bleeding. An example of a more serious problem is stomach bleeding or bleeding into the brain. Your risk of bleeding is increased if you are also taking non-steroidal anti-inflammatory medicine (NSAIDs).  Increased bruising.  Upset stomach.  An allergic reaction. People who have nasal polyps have an increased risk of developing an aspirin allergy. WHAT ARE SOME GUIDELINES I SHOULD FOLLOW WHEN TAKING ASPIRIN?   Take aspirin only as directed by your health care provider. Make sure you understand how much you should take and what form you should take. The two forms of aspirin are:  Non-enteric-coated. This type of aspirin does not have a coating and is absorbed quickly. Non-enteric-coated aspirin is usually recommended for people with chest pain. This type of aspirin also comes in a chewable form.  Enteric-coated. This type of aspirin has a special coating that releases the medicine very slowly. Enteric-coated aspirin causes less stomach upset than non-enteric-coated  aspirin. This type of aspirin should not be chewed or crushed.  Drink alcohol in moderation. Drinking alcohol increases your risk of bleeding. WHEN SHOULD I SEEK MEDICAL CARE?   You have unusual bleeding or bruising.  You have stomach pain.  You have an allergic reaction. Symptoms of an allergic reaction include:  Hives.  Itchy skin.  Swelling of the lips, tongue, or face.  You have ringing in your ears. WHEN SHOULD I SEEK IMMEDIATE MEDICAL CARE?   Your bowel movements are bloody, dark red, or black in color.  You vomit or cough up blood.  You have blood in your urine.  You cough, wheeze, or feel short of breath. If you have any of the following symptoms, this is an emergency. Do not wait to see if the pain will go away. Get medical help at once. Call your local emergency services (911 in the [place] Do not drive yourself to the hospital.  You have severe chest pain, especially if the pain is crushing or pressure-like and spreads to the arms, back, neck, or jaw.  You have stroke-like symptoms, such as:   Loss of vision.   Difficulty talking.   Numbness or weakness on one side of your body.   Numbness or weakness in your arm or leg.   Not thinking clearly or feeling confused.    This information is not intended to replace advice given to you by your health care provider. Make sure you discuss any questions you have with your health care provider.   Document Released: 12/02/2007 Document Revised: 01/09/2014 Document Reviewed: 03/26/2013 Elsevier Interactive Patient Education 2016 Elsevier   Inc.  

## 2015-07-21 NOTE — Discharge Summary (Signed)
Name: Andres Boyd MRN: [id number] DOB: 13-Feb-1955 60 y.o. PCP: No primary care provider on file.  Date of Admission: 07/18/2015 12:01 PM Date of Discharge: 07/21/2015 Attending Physician: Aldine Contes, MD  Discharge Diagnosis: 1. NSTEMI (non-ST elevated myocardial infarction)  Principal Problem:   NSTEMI (non-ST elevated myocardial infarction) ([place]) Active Problems:   HLD (hyperlipidemia)   Tobacco use disorder   Coronary artery disease   Hypotension   History of prolonged Q-T interval on ECG   Mild left ventricular systolic dysfunction  Discharge Medications:   Medication List    TAKE these medications        aspirin 81 MG EC tablet  Take 1 tablet (81 mg total) by mouth daily.     atorvastatin 40 MG tablet  Commonly known as:  LIPITOR  Take 1 tablet (40 mg total) by mouth daily at 6 PM.     nitroGLYCERIN 0.4 MG SL tablet  Commonly known as:  NITROSTAT  Place 1 tablet (0.4 mg total) under the tongue every 5 (five) minutes x 3 doses as needed for chest pain.     OVER THE COUNTER MEDICATION  Take 1 tablet by mouth daily. Super Beta Prostate supplement        Disposition and follow-up:   [name] was discharged from [facility] in Stable condition.  At the hospital follow up visit please address:  1. NSTEMI - assess for any resurgence of symptoms since hospitalization, chest pain, dyspnea on exertion, N/V, palpitations, etc  - Mild to moderate systolic dysfunction LVEF 40-45% and grade 1 diastolic dysfunction. Metoprolol and Lisinopril held at discharge, consider starting lose dose Lisinopril 2.5mg  as outpatient   - Hypotension - initially was hypertensive on admission, started metop/lisinopril, day after cath developed hypotension to 70s/40, symptomatic and orthostatic, improved with approx 4.5L IVF and holding all antihypertensives over 36 hours to 110s/50s, was asymptomatic and ambulatory at these pressures.  - Lifestyle/health risks -  42 pack year smoking history and smokes almost a pack per day currently along and drinks 5-6 beers/day, sometimes more  2.  Labs / imaging needed at time of follow-up: CBC, BMP, Lipid panel  3.  Pending labs/ test needing follow-up: None  Follow-up Appointments: Follow-up Information    Follow up with [facility]. Go on 08/02/2015.   Why:  At 9:45 AM, hospital follow up.   Contact information:   [address] [place] [id number]      Follow up with Ermalinda Barrios, PA-C On 08/23/2015.   Specialty:  Cardiology   Why:  Cardiology Hospital Follow-Up on 08/23/2015 at 10:15AM.   Contact information:   [address]  [place] [address] [place] Hospital Course by problem list:   1. NSTEMI - presented on 7/16, two days after onset os crushing substernal chest pain and nausea/vomiting that began while lifting furniture at work on 7/14, since symptoms began he could not sleep or eat and his N/V began to worsen which pushed him to visit the hospital. He was hypertensive to 160s/100s+ in the ED, nitro given, initial troponins positive and EKG showed no ST changes only LVH and mild prolonged QT. Cath on 7/17 did not reveal any culprit lesions, only mild stenoses of several vessels and mild hypokinesis of the heart base and conserved contraction at apex suggestive of a Takotsubo variant, TTE on 7/17 revealed mild to moderate systolic dysfunction - LVEF 40-45%, basal  hypokinesis suggestive of Takotsubo variant, and grade 1 diastolic dysfunction  2. Hyper- then Hypotension - Initially had pressures up to 180/100 and was started on a nitro gtt in addition to Lisinopril 5mg  and Metoprolol 25mg  BID on 7/16, pressures remained in 150s/80s range and Lisinopril was increased to 10mg  QD on day of cath 7/17. The next day 7/18 manual pressures were found to be in [id number] systolic over [id number] and heart rate only 50s-70s, patient was  orthostatic and symptomatic on standing/sitting up. Bp meds were held and he was given IVF bolus (had poor po intake up to this point). Marginal response to 4.5L over 1 day, pressures improved to 80-90s/40s and no longer symptomatic, eventually improved to 110/50 with HR 56. Metop and Lisinopril held at discharge, possible re-initiation of these deferred to outpatient follow up.   Discharge Vitals:   BP 110/50 mmHg  Pulse 56  Temp(Src) 97.5 F (36.4 C) (Oral)  Resp 11  Ht 6\' 1"  (1.854 m)  Wt 83.7 kg (184 lb 8.4 oz)  BMI 24.35 kg/m2  SpO2 98%  Pertinent Labs, Studies, and Procedures:   CBC Latest Ref Rng 07/21/2015 07/21/2015 07/20/2015  WBC 4.0 - 10.5 K/uL 6.6 5.7 7.2  Hemoglobin 13.0 - 17.0 g/dL 14.1 13.6 17.8(H)  Hematocrit 39.0 - 52.0 % 42.5 40.1 50.8  Platelets 150 - 400 K/uL 221 200 266         Ref Range 2d ago  3d ago     Troponin I <0.03 ng/mL 1.46 (HH) 2.00 (HH)CM        Initial EKG - 07/18/2015 - NSR, LVH, no ST changes   Dg Chest 2 View  07/21/2015  CLINICAL DATA:  Myocardial infarction. EXAM: CHEST  2 VIEW COMPARISON:  07/18/2015 FINDINGS: The heart size and mediastinal contours are within normal limits. There is no evidence of pulmonary edema, consolidation, pneumothorax, nodule or pleural fluid. The visualized skeletal structures are unremarkable. IMPRESSION: No active cardiopulmonary disease. Electronically Signed   By: Aletta Edouard M.D.   On: 07/21/2015 10:27   Left Heart Cath and Coronary Angiography - 07/19/2015 Conclusion  Mid RCA lesion, 25% stenosed.  Ramus lesion, 25% stenosed.  Ost 2nd Diag to 2nd Diag lesion, 50% stenosed.  Prox Cx lesion, 25% stenosed.  There is mild left ventricular systolic dysfunction with hypokinesis noted at the base and normal contraction at the apex. Could be a Takotsubo variant.   No clear culprit lesion for elevated troponin. Continue medical therapy including blood pressure control and tobacco cessation.  TTE -  07/19/2015 Study Conclusions  - Left ventricle: There is akinesis of the basal and mid inferior  and inferoseptal walls. The cavity size was normal. There was  mild concentric hypertrophy. Systolic function was mildly to  moderately reduced. The estimated ejection fraction was in the  range of 40% to 45%. Doppler parameters are consistent with  abnormal left ventricular relaxation (grade 1 diastolic  dysfunction). There was no evidence of elevated ventricular  filling pressure by Doppler parameters. - Aortic valve: Trileaflet; normal thickness leaflets. There was no  regurgitation. - Mitral valve: Structurally normal valve. There was trivial  regurgitation. - Left atrium: The atrium was normal in size. - Right ventricle: Systolic function was normal. - Right atrium: The atrium was normal in size. - Tricuspid valve: There was trivial regurgitation. - Pulmonic valve: There was no regurgitation. - Pulmonary arteries: Systolic pressure was within the normal  range. - Inferior vena cava: The vessel was normal in size. -  Pericardium, extracardiac: There was no pericardial effusion.  Impressions: - CAD in the RCA territory.   Discharge Instructions: Discharge Instructions    Amb Referral to Cardiac Rehabilitation    Complete by:  As directed   Diagnosis:  NSTEMI     Call MD for:  difficulty breathing, headache or visual disturbances    Complete by:  As directed      Call MD for:  extreme fatigue    Complete by:  As directed      Call MD for:  persistant dizziness or light-headedness    Complete by:  As directed      Call MD for:  persistant nausea and vomiting    Complete by:  As directed      Call MD for:  severe uncontrolled pain    Complete by:  As directed      Diet - low sodium heart healthy    Complete by:  As directed      Discharge instructions    Complete by:  As directed   Please take your medications as prescribed and attend all hospital follow up  appointments. It is very important to keep your blood pressure and cholesterol under control to reduce your risk of heart attack or stroke. It is equally if not more important to quit smoking and reduce or quit drinking alcohol - these behaviors greatly increase your risk of heart attack.  Please call us or visit the ER immediately if you develop new symptoms or experience chest pain, shortness of breath, feel suddenly sweaty and weak, pass out, or experience heart palpitations. If you feel dizzy while standing or working - sit down and rest. We have started you on a blood pressure medication and it may take a little while for your body to get used to it.     Increase activity slowly    Complete by:  As directed            Signed: Asencion Partridge, MD 07/21/2015, 1:31 PM   Pager: [phone]

## 2015-07-21 NOTE — Progress Notes (Signed)
SUBJECTIVE:  Orthostatic BP drop earlier with ambulation but less so than previous and no symptoms.  No pain   PHYSICAL EXAM Filed Vitals:   07/21/15 0300 07/21/15 0425 07/21/15 0800 07/21/15 0825  BP: 78/52 75/45 88/50  82/48  Pulse: 65 59 62 74  Temp:  97.5 F (36.4 C)    TempSrc:  Oral    Resp:  8    Height:      Weight:  184 lb 8.4 oz (83.7 kg)    SpO2:  96% 100% 98%   General:  No distress Lungs:  Clear Heart:  RRR Abdomen:  Positive bowel sounds, no rebound no guarding Extremities:  No edema.    LABS: Lab Results  Component Value Date   TROPONINI 1.46* 07/19/2015   Results for orders placed or performed during the hospital encounter of 07/18/15 (from the past 24 hour(s))  CBC     Status: Abnormal   Collection Time: 07/21/15  4:21 AM  Result Value Ref Range   WBC 5.7 4.0 - 10.5 K/uL   RBC 4.13 (L) 4.22 - 5.81 MIL/uL   Hemoglobin 13.6 13.0 - 17.0 g/dL   HCT 40.1 39.0 - 52.0 %   MCV 97.1 78.0 - 100.0 fL   MCH 32.9 26.0 - 34.0 pg   MCHC 33.9 30.0 - 36.0 g/dL   RDW 12.6 11.5 - 15.5 %   Platelets 200 150 - 400 K/uL  Basic metabolic panel     Status: Abnormal   Collection Time: 07/21/15  4:21 AM  Result Value Ref Range   Sodium 133 (L) 135 - 145 mmol/L   Potassium 3.4 (L) 3.5 - 5.1 mmol/L   Chloride 105 101 - 111 mmol/L   CO2 24 22 - 32 mmol/L   Glucose, Bld 103 (H) 65 - 99 mg/dL   BUN 16 6 - 20 mg/dL   Creatinine, Ser 1.16 0.61 - 1.24 mg/dL   Calcium 7.8 (L) 8.9 - 10.3 mg/dL   GFR calc non Af Amer >60 >60 mL/min   GFR calc Af Amer >60 >60 mL/min   Anion gap 4 (L) 5 - 15    Intake/Output Summary (Last 24 hours) at 07/21/15 1034 Last data filed at 07/21/15 0544  Gross per 24 hour  Intake   1480 ml  Output    675 ml  Net    805 ml    CATH   Mid RCA lesion, 25% stenosed.  Ramus lesion, 25% stenosed.  Ost 2nd Diag to 2nd Diag lesion, 50% stenosed.  Prox Cx lesion, 25% stenosed.  There is mild left ventricular systolic dysfunction with  hypokinesis noted at the base and normal contraction at the apex. Could be a Takatsubo variant.  ASSESSMENT AND PLAN:  NSTEMI:    Cath.  Mild plaque.  Mildly reduced EF.   Medical management.   HTN:   BP is still not well controlled supine.  Increased lisinopril to 10 mg.   However, it appears that some of these machine readings might have been overestimated.  BP is probably better controlled supine than reported.  I would still consider sending home on at least 5 mg of Lisinopril.  TOBACCO:  Educated.    DYSLIPIDEMIA:    LDL 155.  Started on Lipitor.    HYPONATREMIA:   Improved.  Can continue to follow as an out patient.   PROLONGED QT:  No dysrhythmias.  QT was still prolonged on EKG yesterday.  However, appears to be better on tele.  Mag level OK.    Avoid QT prolonging drugs.    He has no history of syncope or palpitations and no family history of syncope or sudden death. We will follow this as an outpatient and we will arrange an appt.      [facility] 07/21/2015 10:34 AM

## 2015-07-21 NOTE — Progress Notes (Signed)
Spoke with Dr. Inda Castle several times 07/19/17 through morning r/t the patients BP. SBP 70's-80's. Pt states he feels fine, but has not been out of bed tonight. GCS 14.  Orthostatic vitals per verbal order. Fluids to be administered per electronic order. MD aware fluids are not making changes in BP, orders to continue with most recent fluid order. Will continue to monitor patient.

## 2015-07-21 NOTE — Progress Notes (Signed)
Subjective: Mr. Natt is feeling great today and had just finished walking with the nurse down the hall without dizziness or discomfort of any kind. His bp's remain low, 80-90s/40-50s but have slightly improved from 70s/40s overnight. He received 3L overnight and about 1.5L yesterday throughout the day. He seems entirely asymptomatic at this point, denies chest pain, back pain, dyspnea, and stomach pain. He had a good appetite and ate breakfast this morning.  Objective: Vital signs in last 24 hours: Filed Vitals:   07/21/15 0300 07/21/15 0425 07/21/15 0800 07/21/15 0825  BP: 78/52 75/45 88/50  82/48  Pulse: 65 59 62 74  Temp:  97.5 F (36.4 C)    TempSrc:  Oral    Resp:  8    Height:      Weight:  83.7 kg (184 lb 8.4 oz)    SpO2:  96% 100% 98%    Intake/Output Summary (Last 24 hours) at 07/21/15 0935 Last data filed at 07/21/15 0544  Gross per 24 hour  Intake   1480 ml  Output    675 ml  Net    805 ml   Physical Exam  General appearance: Gentleman sitting in bedside chair, conversational HENT: Normocephalic, neck supple, no JVD, trachea midline Cardiovascular: Regular rate and rhythm, no audible murmurs, rubs, gallops Respiratory/Chest/Back: Clear to auscultation bilaterally, normal work of breathing, no bruising or tenderness to palpation Abdomen: No bruising, soft, non-tender, non-distended Skin: Warm, dry intact Extremities: Normal bulk and ROM, no edema, DP/PT 2+ bilaterally, no hematoma or bleeding from R radial cath site Neuro: Cranial nerves grossly intact, alert and oriented Psych: Appropriate affect  Labs / Imaging / Procedures: CBC Latest Ref Rng 07/21/2015 07/20/2015 07/19/2015  WBC 4.0 - 10.5 K/uL 5.7 7.2 10.2  Hemoglobin 13.0 - 17.0 g/dL 13.6 17.8(H) 17.8(H)  Hematocrit 39.0 - 52.0 % 40.1 50.8 51.4  Platelets 150 - 400 K/uL 200 266 285   BMP Latest Ref Rng 07/21/2015 07/20/2015 07/19/2015  Glucose 65 - 99 mg/dL 103(H) 98 114(H)  BUN 6 - 20 mg/dL 16 14 12     Creatinine 0.61 - 1.24 mg/dL 1.16 1.16 1.05  Sodium 135 - 145 mmol/L 133(L) 131(L) 129(L)  Potassium 3.5 - 5.1 mmol/L 3.4(L) 3.9 4.1  Chloride 101 - 111 mmol/L 105 95(L) 95(L)  CO2 22 - 32 mmol/L 24 27 25   Calcium 8.9 - 10.3 mg/dL 7.8(L) 9.1 9.4   Left Heart Cath and Coronary Angiography - 07/19/15  Mid RCA lesion, 25% stenosed.  Ramus lesion, 25% stenosed.  Ost 2nd Diag to 2nd Diag lesion, 50% stenosed.  Prox Cx lesion, 25% stenosed.  There is mild left ventricular systolic dysfunction with hypokinesis noted at the base and normal contraction at the apex. Could be a Takatsubo variant.  No clear culprit lesion for elevated troponin. Continue medical therapy including blood pressure control and tobacco cessation.  Assessment/Plan: Mr. Winterberg is a 60 year old gentleman with no documented PMH but minimal healthcare contact who presents with chest pain andNSTEMI.  1. NSTEMI, no ischemic EKG changes, serial trops 0.94, 2, 1.46, patient with untreated HTN, smoking history, cath revealed no culprit lesions and TTE LVEF 40-45%, basal hypokinesis, with grade 1 diastolic dysfunction - Asa 325 qd - Lisinopril 5mg  QD - Atorvastatin 40mg  - Misc cardiac:  - Elevated cholesterol and LDL on lipid panel - HbA1c 5.7 - UDS only pos for MJ  2. Hypotension, somewhat improved, no longer symptomatic, unclear etiology, has been approx 36 hours since last dose of  BB, mildly-fluid responsive, may be low normal bp at baseline but unlikely given lifestyle, possible asymptomatic vascular injury during catheterization  - CXR  - recheck CBC at noon  - Dc'd nitro gtt, dc'd metoprolol and held lisinopril   3. Anemia, hemoglobin dropped from 17.8 to 13.6 in one day, likely dilutional etiology but still concerning for occult bleed, no obvious source on exam   - recheck CBC and CXR as  above  4. Preventative health - HIV neg - HepCV neg  Dispo: Anticipated discharge today with cardiology and [facility] follow up.   LOS: 2 days   Asencion Partridge, MD 07/21/2015, 9:35 AM Pager: [phone]

## 2015-07-21 NOTE — Progress Notes (Signed)
CARDIAC REHAB PHASE I   PRE:  Rate/Rhythm: 62 SR  BP:  Sitting: 88/50 L arm manual        SaO2: 100 RA  MODE:  Ambulation: 800 ft   POST:  Rate/Rhythm: 74 SR  BP:  Sitting: 82/48 L arm manual        SaO2: 98 RA  Pt BP remains low, denies any associated symptoms, up ad lib in room. Pt ambulated 800 ft on RA, gait belt, assist x1, steady gait, tolerated well. Pt denies any complaints. Reviewed yesterday's education with pt, pt verbalized understanding, pt states he has no questions at this time. Pt to recliner after walk, call bell within reach.  [id number] Lenna Sciara, RN, BSN 07/21/2015 8:31 AM

## 2015-07-26 ENCOUNTER — Ambulatory Visit: Payer: Medicaid Other

## 2015-08-02 ENCOUNTER — Ambulatory Visit: Payer: Medicaid Other

## 2015-08-23 ENCOUNTER — Ambulatory Visit: Payer: Medicaid Other | Admitting: Physician Assistant

## 2015-08-26 ENCOUNTER — Ambulatory Visit (INDEPENDENT_AMBULATORY_CARE_PROVIDER_SITE_OTHER): Payer: Self-pay | Admitting: Physician Assistant

## 2015-08-26 ENCOUNTER — Encounter: Payer: Self-pay | Admitting: *Deleted

## 2015-08-26 ENCOUNTER — Encounter: Payer: Self-pay | Admitting: Physician Assistant

## 2015-08-26 VITALS — BP 154/98 | HR 58 | Ht 73.0 in | Wt 188.2 lb

## 2015-08-26 DIAGNOSIS — I214 Non-ST elevation (NSTEMI) myocardial infarction: Secondary | ICD-10-CM

## 2015-08-26 DIAGNOSIS — I2510 Atherosclerotic heart disease of native coronary artery without angina pectoris: Secondary | ICD-10-CM

## 2015-08-26 DIAGNOSIS — F17200 Nicotine dependence, unspecified, uncomplicated: Secondary | ICD-10-CM

## 2015-08-26 DIAGNOSIS — E785 Hyperlipidemia, unspecified: Secondary | ICD-10-CM

## 2015-08-26 DIAGNOSIS — I10 Essential (primary) hypertension: Secondary | ICD-10-CM

## 2015-08-26 MED ORDER — NITROGLYCERIN 0.4 MG SL SUBL: 0.4000 mg | SUBLINGUAL_TABLET | SUBLINGUAL | 2 refills | Status: DC | PRN

## 2015-08-26 MED ORDER — ASPIRIN 81 MG PO TBEC: 81.0000 mg | DELAYED_RELEASE_TABLET | Freq: Every day | ORAL | 0 refills | Status: DC

## 2015-08-26 MED ORDER — ATORVASTATIN CALCIUM 40 MG PO TABS: 40.0000 mg | ORAL_TABLET | Freq: Every day | ORAL | 11 refills | Status: DC

## 2015-08-26 MED ORDER — LISINOPRIL 5 MG PO TABS: 5.0000 mg | ORAL_TABLET | Freq: Every day | ORAL | 11 refills | Status: DC

## 2015-08-26 NOTE — Progress Notes (Signed)
Cardiology Office Note    Date:  08/26/2015   ID:  Andres Boyd, DOB [birth year], MRN [id number]  PCP:  No PCP Per Patient  Cardiologist: Dr. Meda Coffee  Chief Complaint  Patient presents with  . Hospitalization Follow-up    4 weeks    History of Present Illness:  Andres Boyd is a 60 y.o. male patient admitted with NSTEMI who underwent cardiac cath and only had mild plaque in mildly reduced EF, could be Takatsubo variant, medical management recommended. He also has hypertension, dyslipidemia started on lipitor, hyponatremia that improved and a prolonged QT with no dysrhythmias. Patient was hypertensive on admission but then developed orthostatic hypotension treated with IV fluids. He was not sent home on any medications though we had talked about sending him home on lisinopril 5 mg once daily. Patient was also smoking a pack of cigarettes a day since he was 60 years old and drinks 5-6 beers daily. Magnesium level is okay. Avoid QT prolonging drugs. No history of syncope or palpitations or family history of syncope or sudden death. 2-D echo EF 40-45%.  Patient didn't fill any meds after discharge because he couldn't afford it. HeHas no means of transportation and has to get his medications filled at [facility] that he could walk to. 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 His blood pressure is elevated today. He quit smoking for a couple days but is back to a pack per day. He says he is only drinking one beer a couple days a week.         Past Medical History:  Diagnosis Date  . Coronary artery disease   . Hyperlipidemia   . Hypertension     Past Surgical History:  Procedure Laterality Date  . CARDIAC CATHETERIZATION Andres Boyd 07/19/2015   Procedure: Left Heart Cath and Coronary Angiography;  Surgeon: Jettie Booze, MD;  Location: [facility];  Service: Cardiovascular;  Laterality:  Andres Boyd;    Current Medications: Outpatient Medications Prior to Visit  Medication Sig Dispense Refill  . aspirin EC 81 MG EC tablet Take 1 tablet (81 mg total) by mouth daily. 90 tablet 2  . atorvastatin (LIPITOR) 40 MG tablet Take 1 tablet (40 mg total) by mouth daily at 6 PM. 90 tablet 2  . nitroGLYCERIN (NITROSTAT) 0.4 MG SL tablet Place 1 tablet (0.4 mg total) under the tongue every 5 (five) minutes x 3 doses as needed for chest pain. 30 tablet 12  . OVER THE COUNTER MEDICATION Take 1 tablet by mouth daily. Super Beta Prostate supplement     No facility-administered medications prior to visit.      Allergies:   Review of patient's allergies indicates no known allergies.   Social History   Social History  . Marital status: Single    Spouse name: Andres Boyd  . Number of children: Andres Boyd  . Years of education: Andres Boyd   Social History Main Topics  . Smoking status: Current Every Day Smoker    Packs/day: 0.75    Types: Cigarettes  . Smokeless tobacco: None  . Alcohol use 7.2 oz/week    12 Cans of beer per week     Comment: 3-4 times per week  . Drug use: Unknown  . Sexual activity: Not Asked   Other Topics Concern  . None   Social History Narrative  . None     Family History:  The patient's  family history includes Diabetes in his father and mother; Hypertension in his mother.   ROS:   Please see the history of present illness.    Review of Systems  Constitution: Negative.  HENT: Negative.   Cardiovascular: Negative.   Respiratory: Positive for cough and shortness of breath.   Endocrine: Negative.   Hematologic/Lymphatic: Negative.   Musculoskeletal: Negative.   Gastrointestinal: Negative.   Genitourinary: Negative.   Neurological: Negative.    All other systems reviewed and are negative.   PHYSICAL EXAM:   VS:  BP (!) 154/98   Pulse (!) 58   Ht 6\' 1"  (1.854 m)   Wt 188 lb 3.2 oz (85.4 kg)   BMI 24.83 kg/m   Physical Exam  GEN: Well nourished, well developed, in no  acute distress   Neck: no JVD, carotid bruits, or masses Cardiac:RRR; Positive S4, no murmurs, rubs, or gallops  Respiratory:  Increased breath sounds with scattered rhonchi  GI: soft, nontender, nondistended, + BS Ext: Right arm at cath site without hematoma or hemorrhage, lower extremities without cyanosis, clubbing, or edema, Good distal pulses bilaterally MS: no deformity or atrophy  Skin: warm and dry, no rash Neuro:  Alert and Oriented x 3, Strength and sensation are intact Psych: euthymic mood, full affect  Wt Readings from Last 3 Encounters:  08/26/15 188 lb 3.2 oz (85.4 kg)  07/21/15 184 lb 8.4 oz (83.7 kg)      Studies/Labs Reviewed:   EKG:  EKG is  ordered today.  The ekg ordered today demonstrates Sinus bradycardia with first-degree AV block at 54 bpm prolonged QT 422 ms, no acute change  Recent Labs: 07/18/2015: ALT 28 07/19/2015: Magnesium 2.5 07/21/2015: BUN 16; Creatinine, Ser 1.16; Hemoglobin 14.1; Platelets 221; Potassium 3.4; Sodium 133   Lipid Panel    Component Value Date/Time   CHOL 238 (H) 07/18/2015 1850   TRIG 81 07/18/2015 1850   HDL 67 07/18/2015 1850   CHOLHDL 3.6 07/18/2015 1850   VLDL 16 07/18/2015 1850   LDLCALC 155 (H) 07/18/2015 1850    Additional studies/ records that were reviewed today include:  2-D echo 7/17/17Study Conclusions   - Left ventricle: There is akinesis of the basal and mid inferior   and inferoseptal walls. The cavity size was normal. There was   mild concentric hypertrophy. Systolic function was mildly to   moderately reduced. The estimated ejection fraction was in the   range of 40% to 45%. Doppler parameters are consistent with   abnormal left ventricular relaxation (grade 1 diastolic   dysfunction). There was no evidence of elevated ventricular   filling pressure by Doppler parameters. - Aortic valve: Trileaflet; normal thickness leaflets. There was no   regurgitation. - Mitral valve: Structurally normal valve. There  was trivial   regurgitation. - Left atrium: The atrium was normal in size. - Right ventricle: Systolic function was normal. - Right atrium: The atrium was normal in size. - Tricuspid valve: There was trivial regurgitation. - Pulmonic valve: There was no regurgitation. - Pulmonary arteries: Systolic pressure was within the normal   range. - Inferior vena cava: The vessel was normal in size. - Pericardium, extracardiac: There was no pericardial effusion.   Impressions:   - CAD in the RCA territory.  Cardiac cath 07/19/15  Conclusion   Mid RCA lesion, 25% stenosed.  Ramus lesion, 25% stenosed.  Ost 2nd Diag to 2nd Diag lesion, 50% stenosed.  Prox Cx lesion, 25% stenosed.  There is mild left ventricular systolic  dysfunction with hypokinesis noted at the base and normal contraction at the apex. Could be a Takatsubo variant.   No clear culprit lesion for elevated troponin.  Continue medical therapy including blood pressure control and tobacco cessation.       ASSESSMENT:    1. NSTEMI (non-ST elevated myocardial infarction) ([place])   2. Coronary artery disease involving native coronary artery of native heart without angina pectoris   3. HLD (hyperlipidemia)   4. Essential hypertension   5. Tobacco use disorder      PLAN:  In order of problems listed above:  Patient suffered a NSTEMI with only minimal nonobstructive CAD at cath but some LV dysfunction could be Takatsubo variant. Actually patient isn't taking any medications. We'll resume aspirin, Lipitor 40 mg daily, lisinopril 5 mg daily and nitroglycerin when necessary. Will probably need follow-up 2-D echo in 3-4 months. We'll give him a note to return to work. No heavy lifting greater than 20-30 pounds. Follow-up with Dr. Meda Coffee in 2 months.  CAD is nonobstructive on cardiac catheterization see above  Hyperlipidemia resume Lipitor 40 mg once daily, check fasting lipid panel in 2 months  Essential hypertension blood  pressure is elevated in the office today. He did have some hypotension in the hospital. Will start low-dose lisinopril 5 mg daily as was planned in the hospital. Call for any dizziness  Tobacco abuse smoking cessation discussed with patient    Medication Adjustments/Labs and Tests Ordered: Current medicines are reviewed at length with the patient today.  Concerns regarding medicines are outlined above.  Medication changes, Labs and Tests ordered today are listed in the Patient Instructions below. There are no Patient Instructions on file for this visit.   Sumner Boast, PA-C  08/26/2015 10:43 AM    [facility] [place], [place],   [address] Phone: [phone]; Fax: [phone]

## 2015-08-26 NOTE — Patient Instructions (Signed)
Medication Instructions:  Your physician has recommended you make the following change in your medication:  1. Start Asprin (81 mg ) daily 2. Start lisinopril (5 mg ) daily 3. Start Atorvastatin (40 mg ) daily 4. Start Nitroglycerin sublingual (0.4 mg ) as needed for chest pain.  Can use up to 3 times in 15 minutes if no relief call 911. 5. All medication sent in today to [facility]   Labwork: Your physician recommends that you return for a FASTING lipid profile when you have appointment with Dr. Meda Coffee   Testing/Procedures: -None  Follow-Up: Your physician recommends that you keep your scheduled follow-up appointment with Dr. Meda Coffee.   Any Other Special Instructions Will Be Listed Below (If Applicable). Smoking Cessation, Tips for Success If you are ready to quit smoking, congratulations! You have chosen to help yourself be healthier. Cigarettes bring nicotine, tar, carbon monoxide, and other irritants into your body. Your lungs, heart, and blood vessels will be able to work better without these poisons. There are many different ways to quit smoking. Nicotine gum, nicotine patches, a nicotine inhaler, or nicotine nasal spray can help with physical craving. Hypnosis, support groups, and medicines help break the habit of smoking. WHAT THINGS CAN I DO TO MAKE QUITTING EASIER?  Here are some tips to help you quit for good:  Pick a date when you will quit smoking completely. Tell all of your friends and family about your plan to quit on that date.  Do not try to slowly cut down on the number of cigarettes you are smoking. Pick a quit date and quit smoking completely starting on that day.  Throw away all cigarettes.   Clean and remove all ashtrays from your home, work, and car.  On a card, write down your reasons for quitting. Carry the card with you and read it when you get the urge to smoke.  Cleanse your body of nicotine. Drink enough water and fluids to keep your urine clear or pale  yellow. Do this after quitting to flush the nicotine from your body.  Learn to predict your moods. Do not let a bad situation be your excuse to have a cigarette. Some situations in your life might tempt you into wanting a cigarette.  Never have "just one" cigarette. It leads to wanting another and another. Remind yourself of your decision to quit.  Change habits associated with smoking. If you smoked while driving or when feeling stressed, try other activities to replace smoking. Stand up when drinking your coffee. Brush your teeth after eating. Sit in a different chair when you read the paper. Avoid alcohol while trying to quit, and try to drink fewer caffeinated beverages. Alcohol and caffeine may urge you to smoke.  Avoid foods and drinks that can trigger a desire to smoke, such as sugary or spicy foods and alcohol.  Ask people who smoke not to smoke around you.  Have something planned to do right after eating or having a cup of coffee. For example, plan to take a walk or exercise.  Try a relaxation exercise to calm you down and decrease your stress. Remember, you may be tense and nervous for the first 2 weeks after you quit, but this will pass.  Find new activities to keep your hands busy. Play with a pen, coin, or rubber band. Doodle or draw things on paper.  Brush your teeth right after eating. This will help cut down on the craving for the taste of tobacco after meals. You  can also try mouthwash.   Use oral substitutes in place of cigarettes. Try using lemon drops, carrots, cinnamon sticks, or chewing gum. Keep them handy so they are available when you have the urge to smoke.  When you have the urge to smoke, try deep breathing.  Designate your home as a nonsmoking area.  If you are a heavy smoker, ask your health care provider about a prescription for nicotine chewing gum. It can ease your withdrawal from nicotine.  Reward yourself. Set aside the cigarette money you save and buy  yourself something nice.  Look for support from others. Join a support group or smoking cessation program. Ask someone at home or at work to help you with your plan to quit smoking.  Always ask yourself, "Do I need this cigarette or is this just a reflex?" Tell yourself, "Today, I choose not to smoke," or "I do not want to smoke." You are reminding yourself of your decision to quit.  Do not replace cigarette smoking with electronic cigarettes (commonly called e-cigarettes). The safety of e-cigarettes is unknown, and some may contain harmful chemicals.  If you relapse, do not give up! Plan ahead and think about what you will do the next time you get the urge to smoke. HOW WILL I FEEL WHEN I QUIT SMOKING? You may have symptoms of withdrawal because your body is used to nicotine (the addictive substance in cigarettes). You may crave cigarettes, be irritable, feel very hungry, cough often, get headaches, or have difficulty concentrating. The withdrawal symptoms are only temporary. They are strongest when you first quit but will go away within 10-14 days. When withdrawal symptoms occur, stay in control. Think about your reasons for quitting. Remind yourself that these are signs that your body is healing and getting used to being without cigarettes. Remember that withdrawal symptoms are easier to treat than the major diseases that smoking can cause.  Even after the withdrawal is over, expect periodic urges to smoke. However, these cravings are generally short lived and will go away whether you smoke or not. Do not smoke! WHAT RESOURCES ARE AVAILABLE TO HELP ME QUIT SMOKING? Your health care provider can direct you to community resources or hospitals for support, which may include:  Group support.  Education.  Hypnosis.  Therapy.   This information is not intended to replace advice given to you by your health care provider. Make sure you discuss any questions you have with your health care  provider.   Document Released: 09/17/2003 Document Revised: 01/09/2014 Document Reviewed: 06/06/2012 Elsevier Interactive Patient Education [employer].   Note was given today to return to work after recent hospitalization.   If you need a refill on your cardiac medications before your next appointment, please call your pharmacy.

## 2015-10-21 ENCOUNTER — Encounter: Payer: Self-pay | Admitting: Cardiology

## 2015-11-03 ENCOUNTER — Ambulatory Visit: Payer: Self-pay | Admitting: Cardiology

## 2015-11-23 ENCOUNTER — Encounter: Payer: Self-pay | Admitting: Cardiology

## 2016-02-10 ENCOUNTER — Ambulatory Visit (INDEPENDENT_AMBULATORY_CARE_PROVIDER_SITE_OTHER): Payer: Self-pay | Admitting: Cardiology

## 2016-02-10 ENCOUNTER — Encounter: Payer: Self-pay | Admitting: Cardiology

## 2016-02-10 ENCOUNTER — Encounter (INDEPENDENT_AMBULATORY_CARE_PROVIDER_SITE_OTHER): Payer: Self-pay

## 2016-02-10 VITALS — BP 122/66 | HR 66 | Ht 73.0 in | Wt 193.0 lb

## 2016-02-10 DIAGNOSIS — E782 Mixed hyperlipidemia: Secondary | ICD-10-CM

## 2016-02-10 DIAGNOSIS — I2510 Atherosclerotic heart disease of native coronary artery without angina pectoris: Secondary | ICD-10-CM

## 2016-02-10 DIAGNOSIS — R079 Chest pain, unspecified: Secondary | ICD-10-CM | POA: Insufficient documentation

## 2016-02-10 DIAGNOSIS — R072 Precordial pain: Secondary | ICD-10-CM

## 2016-02-10 DIAGNOSIS — I5181 Takotsubo syndrome: Secondary | ICD-10-CM

## 2016-02-10 DIAGNOSIS — F17200 Nicotine dependence, unspecified, uncomplicated: Secondary | ICD-10-CM

## 2016-02-10 DIAGNOSIS — I214 Non-ST elevation (NSTEMI) myocardial infarction: Secondary | ICD-10-CM

## 2016-02-10 DIAGNOSIS — I739 Peripheral vascular disease, unspecified: Secondary | ICD-10-CM | POA: Insufficient documentation

## 2016-02-10 MED ORDER — ISOSORBIDE MONONITRATE ER 30 MG PO TB24: 15.0000 mg | ORAL_TABLET | Freq: Every day | ORAL | 3 refills | Status: DC

## 2016-02-10 NOTE — Patient Instructions (Signed)
Medication Instructions:   START TAKING IMDUR 15 MG ONCE DAILY (THIS WILL COME IN 1/2 TABLETS)   Labwork:  TODAY-  CMET, CBC W DIFF, TSH, AND LIPIDS    Testing/Procedures:  Your physician has requested that you have an echocardiogram. Echocardiography is a painless test that uses sound waves to create images of your heart. It provides your doctor with information about the size and shape of your heart and how well your heart's chambers and valves are working. This procedure takes approximately one hour. There are no restrictions for this procedure.   Your physician has requested that you have a lower extremity arterial duplex. This test is an ultrasound of the arteries in the legs or arms. It looks at arterial blood flow in the legs and arms. Allow one hour for Lower and Upper Arterial scans. There are no restrictions or special instructions    Follow-Up:  2 MONTHS WITH DR Meda Coffee       If you need a refill on your cardiac medications before your next appointment, please call your pharmacy.

## 2016-02-10 NOTE — Progress Notes (Signed)
Cardiology Office Note    Date:  02/10/2016   ID:  Andres Boyd, DOB 1955/04/04, MRN [id number]  PCP:  No PCP Per Patient  Cardiologist: Dr. Meda Coffee  No chief complaint on file.   History of Present Illness:  Andres Boyd is a 61 y.o. male patient admitted with NSTEMI who underwent cardiac cath and only had mild plaque in mildly reduced EF, could be Takatsubo variant, medical management recommended. He also has hypertension, dyslipidemia started on lipitor, hyponatremia that improved and a prolonged QT with no dysrhythmias. Patient was hypertensive on admission but then developed orthostatic hypotension treated with IV fluids. He was not sent home on any medications though we had talked about sending him home on lisinopril 5 mg once daily. Patient was also smoking a pack of cigarettes a day since he was 61 years old and drinks 5-6 beers daily. Magnesium level is okay. Avoid QT prolonging drugs. No history of syncope or palpitations or family history of syncope or sudden death. 2-D echo EF 40-45%.  Patient didn't fill any meds after discharge because he couldn't afford it. HeHas no means of transportation and has to get his medications filled at [facility] that he could walk to. 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 His blood pressure is elevated today. He quit smoking for a couple days but is back to a pack per day. He says he is only drinking one beer a couple days a week.  02/10/2016 - patient is coming after 6 months, he has developed chest pain that are nonexertional in the last couple weeks, they would last seconds to minutes a sharp retrosternal and are not radiating anywhere. Patient alsoexertional shortness of breath. No fever chills no cough no lower extremity edema. He has also noticed significant claudications in both of his calves when walking longer distances. He continues to smoke about  half pack a day.   Past Medical History:  Diagnosis Date  . Coronary artery disease   . Hyperlipidemia   . Hypertension     Past Surgical History:  Procedure Laterality Date  . CARDIAC CATHETERIZATION N/A 07/19/2015   Procedure: Left Heart Cath and Coronary Angiography;  Surgeon: Jettie Booze, MD;  Location: [facility];  Service: Cardiovascular;  Laterality: N/A;    Current Medications: Outpatient Medications Prior to Visit  Medication Sig Dispense Refill  . aspirin 81 MG EC tablet Take 1 tablet (81 mg total) by mouth daily. 30 tablet 0  . atorvastatin (LIPITOR) 40 MG tablet Take 1 tablet (40 mg total) by mouth daily at 6 PM. 30 tablet 11  . lisinopril (PRINIVIL,ZESTRIL) 5 MG tablet Take 1 tablet (5 mg total) by mouth daily. 30 tablet 11  . nitroGLYCERIN (NITROSTAT) 0.4 MG SL tablet Place 1 tablet (0.4 mg total) under the tongue every 5 (five) minutes x 3 doses as needed for chest pain. (Patient not taking: Reported on 02/10/2016) 25 tablet 2   No facility-administered medications prior to visit.      Allergies:   Patient has no known allergies.   Social History   Social History  . Marital status: Single    Spouse name: N/A  . Number of children: N/A  . Years of education: N/A   Social History Main Topics  . Smoking status: Current Every Day Smoker    Packs/day: 0.75    Types: Cigarettes  . Smokeless tobacco: Never Used  .  Alcohol use 7.2 oz/week    12 Cans of beer per week     Comment: 3-4 times per week  . Drug use: No  . Sexual activity: Not Asked   Other Topics Concern  . None   Social History Narrative  . None     Family History:  The patient's   family history includes Diabetes in his father and mother; Hypertension in his mother.   ROS:   Please see the history of present illness.    Review of Systems  Constitution: Negative.  HENT: Negative.   Cardiovascular: Negative.   Respiratory: Positive for cough and shortness of breath.     Endocrine: Negative.   Hematologic/Lymphatic: Negative.   Musculoskeletal: Negative.   Gastrointestinal: Negative.   Genitourinary: Negative.   Neurological: Negative.    All other systems reviewed and are negative.  PHYSICAL EXAM:   VS:  BP 122/66   Pulse 66   Ht 6\' 1"  (1.854 m)   Wt 193 lb (87.5 kg)   SpO2 96%   BMI 25.46 kg/m   Physical Exam  GEN: Well nourished, well developed, in no acute distress   Neck: no JVD, carotid bruits, or masses Cardiac:RRR; Positive S4, no murmurs, rubs, or gallops  Respiratory:  Increased breath sounds with scattered rhonchi  GI: soft, nontender, nondistended, + BS Ext: Right arm at cath site without hematoma or hemorrhage, lower extremities without cyanosis, clubbing, or edema, Good distal pulses bilaterally MS: no deformity or atrophy  Skin: warm and dry, no rash Neuro:  Alert and Oriented x 3, Strength and sensation are intact Psych: euthymic mood, full affect  Wt Readings from Last 3 Encounters:  02/10/16 193 lb (87.5 kg)  08/26/15 188 lb 3.2 oz (85.4 kg)  07/21/15 184 lb 8.4 oz (83.7 kg)      Studies/Labs Reviewed:   EKG:  EKG is  ordered today.  The ekg ordered today demonstrates Sinus bradycardia with first-degree AV block at 54 bpm prolonged QT 422 ms, no acute change  Recent Labs: 07/18/2015: ALT 28 07/19/2015: Magnesium 2.5 07/21/2015: BUN 16; Creatinine, Ser 1.16; Hemoglobin 14.1; Platelets 221; Potassium 3.4; Sodium 133   Lipid Panel    Component Value Date/Time   CHOL 238 (H) 07/18/2015 1850   TRIG 81 07/18/2015 1850   HDL 67 07/18/2015 1850   CHOLHDL 3.6 07/18/2015 1850   VLDL 16 07/18/2015 1850   LDLCALC 155 (H) 07/18/2015 1850    Additional studies/ records that were reviewed today include:  2-D echo 7/17/17Study Conclusions   - Left ventricle: There is akinesis of the basal and mid inferior   and inferoseptal walls. The cavity size was normal. There was   mild concentric hypertrophy. Systolic function was  mildly to   moderately reduced. The estimated ejection fraction was in the   range of 40% to 45%. Doppler parameters are consistent with   abnormal left ventricular relaxation (grade 1 diastolic   dysfunction). There was no evidence of elevated ventricular   filling pressure by Doppler parameters. - Aortic valve: Trileaflet; normal thickness leaflets. There was no   regurgitation. - Mitral valve: Structurally normal valve. There was trivial   regurgitation. - Left atrium: The atrium was normal in size. - Right ventricle: Systolic function was normal. - Right atrium: The atrium was normal in size. - Tricuspid valve: There was trivial regurgitation. - Pulmonic valve: There was no regurgitation. - Pulmonary arteries: Systolic pressure was within the normal   range. - Inferior vena cava:  The vessel was normal in size. - Pericardium, extracardiac: There was no pericardial effusion.   Impressions:   - CAD in the RCA territory.  Cardiac cath 07/19/15  Conclusion   Mid RCA lesion, 25% stenosed.  Ramus lesion, 25% stenosed.  Ost 2nd Diag to 2nd Diag lesion, 50% stenosed.  Prox Cx lesion, 25% stenosed.  There is mild left ventricular systolic dysfunction with hypokinesis noted at the base and normal contraction at the apex. Could be a Takatsubo variant.   No clear culprit lesion for elevated troponin.  Continue medical therapy including blood pressure control and tobacco cessation.       ASSESSMENT:    1. NSTEMI (non-ST elevated myocardial infarction) ([place])   2. Coronary artery disease involving native coronary artery of native heart without angina pectoris   3. Mixed hyperlipidemia   4. Tobacco use disorder   5. Takotsubo cardiomyopathy   6. Claudication in peripheral vascular disease ([place])    PLAN:  In order of problems listed above:  Patient suffered a NSTEMI with only minimal nonobstructive CAD at cath but some LV dysfunction could be Takatsubo variant. The other  option includes coronary spasm. Patient's chest pain today is atypical. His ECG is unchanged today, SB and no ischemic changes. We will add 15 mg of Imdur to his regimen. Patient is informed to EP developed side effects such as headache he should discontinue. We will schedule an echocardiogram to reevaluate his LVEF and follow in 2 months.   Claudications in a smoker, we'll order bilateral lower extremity duplex.  Hyperlipidemia - on Lipitor 40 mg once daily, check LFTs today..  Essential hypertension - controlled  Tobacco abuse smoking cessation discussed with patient  Check labs including CBC, TSH, CMP, lipids today.  Medication Adjustments/Labs and Tests Ordered: Current medicines are reviewed at length with the patient today.  Concerns regarding medicines are outlined above.  Medication changes, Labs and Tests ordered today are listed in the Patient Instructions below. There are no Patient Instructions on file for this visit.   Signed, Ena Dawley, MD  02/10/2016 11:06 AM    [address], [place], [place]  [address] Phone: [phone]; Fax: [phone]

## 2016-02-11 LAB — COMPREHENSIVE METABOLIC PANEL
ALT: 33 IU/L (ref 0–44)
AST: 28 IU/L (ref 0–40)
Albumin/Globulin Ratio: 1.4 (ref 1.2–2.2)
Albumin: 4.6 g/dL (ref 3.6–4.8)
Alkaline Phosphatase: 68 IU/L (ref 39–117)
BUN/Creatinine Ratio: 10 (ref 10–24)
BUN: 11 mg/dL (ref 8–27)
Bilirubin Total: 0.3 mg/dL (ref 0.0–1.2)
CO2: 24 mmol/L (ref 18–29)
Calcium: 9.3 mg/dL (ref 8.6–10.2)
Chloride: 99 mmol/L (ref 96–106)
Creatinine, Ser: 1.07 mg/dL (ref 0.76–1.27)
GFR calc Af Amer: 86 mL/min/{1.73_m2} (ref >59)
GFR calc non Af Amer: 75 mL/min/{1.73_m2} (ref >59)
Globulin, Total: 3.4 g/dL (ref 1.5–4.5)
Glucose: 91 mg/dL (ref 65–99)
Potassium: 4.4 mmol/L (ref 3.5–5.2)
Sodium: 139 mmol/L (ref 134–144)
Total Protein: 8 g/dL (ref 6.0–8.5)

## 2016-02-11 LAB — CBC WITH DIFFERENTIAL/PLATELET
Basophils Absolute: 0 10*3/uL (ref 0.0–0.2)
Basos: 0 %
EOS (ABSOLUTE): 0.1 10*3/uL (ref 0.0–0.4)
Eos: 2 %
Hematocrit: 42.5 % (ref 37.5–51.0)
Hemoglobin: 14.3 g/dL (ref 13.0–17.7)
Immature Grans (Abs): 0 10*3/uL (ref 0.0–0.1)
Immature Granulocytes: 0 %
Lymphocytes Absolute: 1.8 10*3/uL (ref 0.7–3.1)
Lymphs: 39 %
MCH: 32.2 pg (ref 26.6–33.0)
MCHC: 33.6 g/dL (ref 31.5–35.7)
MCV: 96 fL (ref 79–97)
Monocytes Absolute: 0.5 10*3/uL (ref 0.1–0.9)
Monocytes: 12 %
Neutrophils Absolute: 2.1 10*3/uL (ref 1.4–7.0)
Neutrophils: 47 %
Platelets: 255 10*3/uL (ref 150–379)
RBC: 4.44 x10E6/uL (ref 4.14–5.80)
RDW: 13.7 % (ref 12.3–15.4)
WBC: 4.5 10*3/uL (ref 3.4–10.8)

## 2016-02-11 LAB — LIPID PANEL
Chol/HDL Ratio: 2.8 ratio units (ref 0.0–5.0)
Cholesterol, Total: 119 mg/dL (ref 100–199)
HDL: 42 mg/dL (ref >39)
LDL Calculated: 60 mg/dL (ref 0–99)
Triglycerides: 84 mg/dL (ref 0–149)
VLDL Cholesterol Cal: 17 mg/dL (ref 5–40)

## 2016-02-11 LAB — TSH: TSH: 1.03 u[IU]/mL (ref 0.450–4.500)

## 2016-02-25 ENCOUNTER — Encounter (HOSPITAL_COMMUNITY): Payer: Self-pay

## 2016-02-25 ENCOUNTER — Other Ambulatory Visit (HOSPITAL_COMMUNITY): Payer: Self-pay

## 2016-03-29 ENCOUNTER — Encounter: Payer: Self-pay | Admitting: Cardiology

## 2016-04-03 ENCOUNTER — Encounter: Payer: Self-pay | Admitting: Cardiology

## 2016-04-12 ENCOUNTER — Encounter: Payer: Self-pay | Admitting: Cardiology

## 2016-04-12 ENCOUNTER — Ambulatory Visit (INDEPENDENT_AMBULATORY_CARE_PROVIDER_SITE_OTHER): Payer: Self-pay | Admitting: Cardiology

## 2016-04-12 ENCOUNTER — Encounter (INDEPENDENT_AMBULATORY_CARE_PROVIDER_SITE_OTHER): Payer: Self-pay

## 2016-04-12 VITALS — BP 122/60 | HR 62 | Ht 73.0 in | Wt 194.0 lb

## 2016-04-12 DIAGNOSIS — I214 Non-ST elevation (NSTEMI) myocardial infarction: Secondary | ICD-10-CM

## 2016-04-12 DIAGNOSIS — I2510 Atherosclerotic heart disease of native coronary artery without angina pectoris: Secondary | ICD-10-CM

## 2016-04-12 DIAGNOSIS — I10 Essential (primary) hypertension: Secondary | ICD-10-CM

## 2016-04-12 DIAGNOSIS — I5181 Takotsubo syndrome: Secondary | ICD-10-CM

## 2016-04-12 DIAGNOSIS — F17200 Nicotine dependence, unspecified, uncomplicated: Secondary | ICD-10-CM

## 2016-04-12 NOTE — Patient Instructions (Addendum)
Medication Instructions:   Your physician recommends that you continue on your current medications as directed. Please refer to the Current Medication list given to you today.    Your physician has requested that you have an echocardiogram. Echocardiography is a painless test that uses sound waves to create images of your heart. It provides your doctor with information about the size and shape of your heart and how well your heart's chambers and valves are working. This procedure takes approximately one hour. There are no restrictions for this procedure.      Follow-Up:  Your physician wants you to follow-up in: [place] will receive a reminder letter in the mail two months in advance. If you don't receive a letter, please call our office to schedule the follow-up appointment.        If you need a refill on your cardiac medications before your next appointment, please call your pharmacy.

## 2016-04-12 NOTE — Progress Notes (Signed)
Cardiology Office Note    Date:  04/12/2016   ID:  [name], DOB September 20, 1955, MRN [id number]  PCP:  No PCP Per Patient  Cardiologist: Dr. Meda Coffee  No chief complaint on file.   History of Present Illness:  Domanik [place] is a 61 y.o. male patient admitted with NSTEMI who underwent cardiac cath and only had mild plaque in mildly reduced EF, could be Takatsubo variant, medical management recommended. He also has hypertension, dyslipidemia started on lipitor, hyponatremia that improved and a prolonged QT with no dysrhythmias. Patient was hypertensive on admission but then developed orthostatic hypotension treated with IV fluids. He was not sent home on any medications though we had talked about sending him home on lisinopril 5 mg once daily. Patient was also smoking a pack of cigarettes a day since he was 61 years old and drinks 5-6 beers daily. Magnesium level is okay. Avoid QT prolonging drugs. No history of syncope or palpitations or family history of syncope or sudden death. 2-D echo EF 40-45%.  Patient didn't fill any meds after discharge because he couldn't afford it. HeHas no means of transportation and has to get his medications filled at [facility] that he could walk to. 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 His blood pressure is elevated today. He quit smoking for a couple days but is back to a pack per day. He says he is only drinking one beer a couple days a week.  02/10/2016 - patient is coming after 6 months, he has developed chest pain that are nonexertional in the last couple weeks, they would last seconds to minutes a sharp retrosternal and are not radiating anywhere. Patient alsoexertional shortness of breath. No fever chills no cough no lower extremity edema. He has also noticed significant claudications in both of his calves when walking longer distances. He continues to smoke about  half pack a day.  04/12/2016 - 2 months follow-up, the patient is doing well, he is tolerating all his medications including Imdur, he now has no chest pain. He also denies shortness of breath, lower extremity edema orthopnea or paroxysmal nocturnal dyspnea. He continues to smoke.   Past Medical History:  Diagnosis Date  . Coronary artery disease   . Hyperlipidemia   . Hypertension     Past Surgical History:  Procedure Laterality Date  . CARDIAC CATHETERIZATION N/A 07/19/2015   Procedure: Left Heart Cath and Coronary Angiography;  Surgeon: Jettie Booze, MD;  Location: [facility];  Service: Cardiovascular;  Laterality: N/A;    Current Medications: Outpatient Medications Prior to Visit  Medication Sig Dispense Refill  . aspirin 81 MG EC tablet Take 1 tablet (81 mg total) by mouth daily. 30 tablet 0  . isosorbide mononitrate (IMDUR) 30 MG 24 hr tablet Take 0.5 tablets (15 mg total) by mouth daily. 45 tablet 3  . lisinopril (PRINIVIL,ZESTRIL) 5 MG tablet Take 1 tablet (5 mg total) by mouth daily. 30 tablet 11  . atorvastatin (LIPITOR) 40 MG tablet Take 1 tablet (40 mg total) by mouth daily at 6 PM. (Patient not taking: Reported on 04/12/2016) 30 tablet 11  . nitroGLYCERIN (NITROSTAT) 0.4 MG SL tablet Place 1 tablet (0.4 mg total) under the tongue every 5 (five) minutes x 3 doses as needed for chest pain. (Patient not taking: Reported on 02/10/2016) 25 tablet 2   No facility-administered medications prior to visit.  Allergies:   Patient has no known allergies.   Social History   Social History  . Marital status: Single    Spouse name: N/A  . Number of children: N/A  . Years of education: N/A   Social History Main Topics  . Smoking status: Current Every Day Smoker    Packs/day: 0.75    Types: Cigarettes  . Smokeless tobacco: Never Used  . Alcohol use 7.2 oz/week    12 Cans of beer per week     Comment: 3-4 times per week  . Drug use: No  . Sexual activity: Not  Asked   Other Topics Concern  . None   Social History Narrative  . None     Family History:  The patient's   family history includes Diabetes in his father and mother; Hypertension in his mother.   ROS:   Please see the history of present illness.    Review of Systems  Constitution: Negative.  HENT: Negative.   Cardiovascular: Negative.   Respiratory: Positive for cough and shortness of breath.   Endocrine: Negative.   Hematologic/Lymphatic: Negative.   Musculoskeletal: Negative.   Gastrointestinal: Negative.   Genitourinary: Negative.   Neurological: Negative.    All other systems reviewed and are negative.  PHYSICAL EXAM:   VS:  BP 122/60   Pulse 62   Ht 6\' 1"  (1.854 m)   Wt 194 lb (88 kg)   SpO2 93%   BMI 25.60 kg/m   Physical Exam  GEN: Well nourished, well developed, in no acute distress   Neck: no JVD, carotid bruits, or masses Cardiac:RRR; Positive S4, no murmurs, rubs, or gallops  Respiratory:  Increased breath sounds with scattered rhonchi  GI: soft, nontender, nondistended, + BS Ext: Right arm at cath site without hematoma or hemorrhage, lower extremities without cyanosis, clubbing, or edema, Good distal pulses bilaterally MS: no deformity or atrophy  Skin: warm and dry, no rash Neuro:  Alert and Oriented x 3, Strength and sensation are intact Psych: euthymic mood, full affect  Wt Readings from Last 3 Encounters:  04/12/16 194 lb (88 kg)  02/10/16 193 lb (87.5 kg)  08/26/15 188 lb 3.2 oz (85.4 kg)      Studies/Labs Reviewed:   EKG:  EKG is  ordered today.  The ekg ordered today demonstrates Sinus bradycardia with first-degree AV block at 54 bpm prolonged QT 422 ms, no acute change  Recent Labs: 07/19/2015: Magnesium 2.5 07/21/2015: Hemoglobin 14.1 02/10/2016: ALT 33; BUN 11; Creatinine, Ser 1.07; Platelets 255; Potassium 4.4; Sodium 139; TSH 1.030   Lipid Panel    Component Value Date/Time   CHOL 119 02/10/2016 1139   TRIG 84 02/10/2016 1139    HDL 42 02/10/2016 1139   CHOLHDL 2.8 02/10/2016 1139   CHOLHDL 3.6 07/18/2015 1850   VLDL 16 07/18/2015 1850   LDLCALC 60 02/10/2016 1139    Additional studies/ records that were reviewed today include:  2-D echo 7/17/17Study Conclusions   - Left ventricle: There is akinesis of the basal and mid inferior   and inferoseptal walls. The cavity size was normal. There was   mild concentric hypertrophy. Systolic function was mildly to   moderately reduced. The estimated ejection fraction was in the   range of 40% to 45%. Doppler parameters are consistent with   abnormal left ventricular relaxation (grade 1 diastolic   dysfunction). There was no evidence of elevated ventricular   filling pressure by Doppler parameters. - Aortic valve: Trileaflet; normal thickness  leaflets. There was no   regurgitation. - Mitral valve: Structurally normal valve. There was trivial   regurgitation. - Left atrium: The atrium was normal in size. - Right ventricle: Systolic function was normal. - Right atrium: The atrium was normal in size. - Tricuspid valve: There was trivial regurgitation. - Pulmonic valve: There was no regurgitation. - Pulmonary arteries: Systolic pressure was within the normal   range. - Inferior vena cava: The vessel was normal in size. - Pericardium, extracardiac: There was no pericardial effusion.   Impressions:   - CAD in the RCA territory.  Cardiac cath 07/19/15  Conclusion   Mid RCA lesion, 25% stenosed.  Ramus lesion, 25% stenosed.  Ost 2nd Diag to 2nd Diag lesion, 50% stenosed.  Prox Cx lesion, 25% stenosed.  There is mild left ventricular systolic dysfunction with hypokinesis noted at the base and normal contraction at the apex. Could be a Takatsubo variant.   No clear culprit lesion for elevated troponin.  Continue medical therapy including blood pressure control and tobacco cessation.       ASSESSMENT:    1. Coronary artery disease involving native coronary  artery of native heart without angina pectoris   2. NSTEMI (non-ST elevated myocardial infarction) ([place])   3. Takotsubo cardiomyopathy   4. Tobacco use disorder   5. Essential hypertension    PLAN:  In order of problems listed above:  1.  NSTEMI with only minimal nonobstructive CAD On cath in July 2017 at cath but some LV dysfunction could be Takatsubo variant. The other option includes coronary spasm. The patient was started on Imdur 50 mg daily that he tolerates well and is asymptomatic. We will repeat echocardiogram to reevaluate his heart function.  2. Hypertension - controlled.  3. Hyperlipidemia - on lipitor that is well tolerated.. Lipids at goal in February.    Medication Adjustments/Labs and Tests Ordered: Current medicines are reviewed at length with the patient today.  Concerns regarding medicines are outlined above.  Medication changes, Labs and Tests ordered today are listed in the Patient Instructions below. There are no Patient Instructions on file for this visit.   Signed, Ena Dawley, MD  04/12/2016 11:42 AM    [address], [place], [place]  [address] Phone: [phone]; Fax: [phone]

## 2016-04-27 ENCOUNTER — Ambulatory Visit (HOSPITAL_COMMUNITY): Payer: Self-pay | Attending: Cardiovascular Disease

## 2016-04-27 ENCOUNTER — Other Ambulatory Visit: Payer: Self-pay

## 2016-04-27 DIAGNOSIS — F17200 Nicotine dependence, unspecified, uncomplicated: Secondary | ICD-10-CM

## 2016-04-27 DIAGNOSIS — Z720 Tobacco use: Secondary | ICD-10-CM | POA: Insufficient documentation

## 2016-04-27 DIAGNOSIS — I214 Non-ST elevation (NSTEMI) myocardial infarction: Secondary | ICD-10-CM | POA: Insufficient documentation

## 2016-04-27 DIAGNOSIS — E785 Hyperlipidemia, unspecified: Secondary | ICD-10-CM | POA: Insufficient documentation

## 2016-04-27 DIAGNOSIS — I2510 Atherosclerotic heart disease of native coronary artery without angina pectoris: Secondary | ICD-10-CM | POA: Insufficient documentation

## 2016-04-27 DIAGNOSIS — I5181 Takotsubo syndrome: Secondary | ICD-10-CM

## 2016-04-27 DIAGNOSIS — I10 Essential (primary) hypertension: Secondary | ICD-10-CM | POA: Insufficient documentation

## 2016-07-02 ENCOUNTER — Encounter (HOSPITAL_COMMUNITY): Payer: Self-pay | Admitting: Emergency Medicine

## 2016-07-02 ENCOUNTER — Inpatient Hospital Stay (HOSPITAL_COMMUNITY)
Admission: EM | Admit: 2016-07-02 | Discharge: 2016-07-04 | DRG: 287 | Disposition: A | Discharge status: Home / Self Care | Payer: Medicaid Other | Attending: Internal Medicine | Admitting: Internal Medicine

## 2016-07-02 ENCOUNTER — Emergency Department (HOSPITAL_COMMUNITY): Payer: Medicaid Other

## 2016-07-02 DIAGNOSIS — F17210 Nicotine dependence, cigarettes, uncomplicated: Secondary | ICD-10-CM | POA: Diagnosis present

## 2016-07-02 DIAGNOSIS — R079 Chest pain, unspecified: Secondary | ICD-10-CM | POA: Diagnosis present

## 2016-07-02 DIAGNOSIS — R0789 Other chest pain: Principal | ICD-10-CM | POA: Diagnosis present

## 2016-07-02 DIAGNOSIS — R072 Precordial pain: Secondary | ICD-10-CM

## 2016-07-02 DIAGNOSIS — I440 Atrioventricular block, first degree: Secondary | ICD-10-CM | POA: Diagnosis present

## 2016-07-02 DIAGNOSIS — I519 Heart disease, unspecified: Secondary | ICD-10-CM | POA: Diagnosis present

## 2016-07-02 DIAGNOSIS — I252 Old myocardial infarction: Secondary | ICD-10-CM

## 2016-07-02 DIAGNOSIS — E785 Hyperlipidemia, unspecified: Secondary | ICD-10-CM | POA: Diagnosis present

## 2016-07-02 DIAGNOSIS — I10 Essential (primary) hypertension: Secondary | ICD-10-CM | POA: Diagnosis present

## 2016-07-02 DIAGNOSIS — R778 Other specified abnormalities of plasma proteins: Secondary | ICD-10-CM

## 2016-07-02 DIAGNOSIS — Z8679 Personal history of other diseases of the circulatory system: Secondary | ICD-10-CM

## 2016-07-02 DIAGNOSIS — Z8249 Family history of ischemic heart disease and other diseases of the circulatory system: Secondary | ICD-10-CM

## 2016-07-02 DIAGNOSIS — F1010 Alcohol abuse, uncomplicated: Secondary | ICD-10-CM | POA: Diagnosis present

## 2016-07-02 DIAGNOSIS — Z23 Encounter for immunization: Secondary | ICD-10-CM

## 2016-07-02 DIAGNOSIS — I5189 Other ill-defined heart diseases: Secondary | ICD-10-CM | POA: Diagnosis present

## 2016-07-02 DIAGNOSIS — Z79899 Other long term (current) drug therapy: Secondary | ICD-10-CM

## 2016-07-02 DIAGNOSIS — F1011 Alcohol abuse, in remission: Secondary | ICD-10-CM | POA: Diagnosis present

## 2016-07-02 DIAGNOSIS — I2510 Atherosclerotic heart disease of native coronary artery without angina pectoris: Secondary | ICD-10-CM | POA: Diagnosis present

## 2016-07-02 DIAGNOSIS — F17200 Nicotine dependence, unspecified, uncomplicated: Secondary | ICD-10-CM | POA: Diagnosis present

## 2016-07-02 DIAGNOSIS — R7989 Other specified abnormal findings of blood chemistry: Secondary | ICD-10-CM

## 2016-07-02 DIAGNOSIS — Z7982 Long term (current) use of aspirin: Secondary | ICD-10-CM

## 2016-07-02 DIAGNOSIS — I429 Cardiomyopathy, unspecified: Secondary | ICD-10-CM | POA: Diagnosis present

## 2016-07-02 DIAGNOSIS — Z833 Family history of diabetes mellitus: Secondary | ICD-10-CM

## 2016-07-02 HISTORY — DX: Personal history of other diseases of the circulatory system: Z86.79

## 2016-07-02 HISTORY — DX: Alcohol abuse, uncomplicated: F10.10

## 2016-07-02 HISTORY — DX: Atrioventricular block, first degree: I44.0

## 2016-07-02 HISTORY — DX: Tobacco use: Z72.0

## 2016-07-02 LAB — CBC
HEMATOCRIT: 46.2 % (ref 39.0–52.0)
HEMOGLOBIN: 16.1 g/dL (ref 13.0–17.0)
MCH: 33.1 pg (ref 26.0–34.0)
MCHC: 34.8 g/dL (ref 30.0–36.0)
MCV: 94.9 fL (ref 78.0–100.0)
PLATELETS: 252 10*3/uL (ref 150–400)
RBC: 4.87 MIL/uL (ref 4.22–5.81)
RDW: 12.8 % (ref 11.5–15.5)
WBC: 6.3 10*3/uL (ref 4.0–10.5)

## 2016-07-02 LAB — I-STAT TROPONIN, ED
TROPONIN I, POC: 0.04 ng/mL (ref 0.00–0.08)
Troponin i, poc: 0 ng/mL (ref 0.00–0.08)

## 2016-07-02 LAB — BASIC METABOLIC PANEL
Anion gap: 10 (ref 5–15)
BUN: 13 mg/dL (ref 6–20)
CHLORIDE: 102 mmol/L (ref 101–111)
CO2: 22 mmol/L (ref 22–32)
CREATININE: 1.04 mg/dL (ref 0.61–1.24)
Calcium: 9.7 mg/dL (ref 8.9–10.3)
GFR calc non Af Amer: >60 mL/min (ref >60)
Glucose, Bld: 112 mg/dL — ABNORMAL HIGH (ref 65–99)
POTASSIUM: 4.6 mmol/L (ref 3.5–5.1)
Sodium: 134 mmol/L — ABNORMAL LOW (ref 135–145)

## 2016-07-02 LAB — TROPONIN I
Troponin I: 0.07 ng/mL (ref <0.03)
Troponin I: 0.23 ng/mL (ref <0.03)

## 2016-07-02 MED ORDER — THIAMINE HCL 100 MG/ML IJ SOLN: 100.0000 mg | Freq: Every day | INTRAMUSCULAR | Status: DC

## 2016-07-02 MED ORDER — ACETAMINOPHEN 325 MG PO TABS: 650.0000 mg | ORAL_TABLET | ORAL | Status: DC | PRN

## 2016-07-02 MED ORDER — NICOTINE 14 MG/24HR TD PT24
14.0000 mg | MEDICATED_PATCH | Freq: Every day | TRANSDERMAL | Status: DC
  Administered 2016-07-02 – 2016-07-04 (×3): 14 mg via TRANSDERMAL
  Filled 2016-07-02 (×3): qty 1

## 2016-07-02 MED ORDER — VITAMIN B-1 100 MG PO TABS
100.0000 mg | ORAL_TABLET | Freq: Every day | ORAL | Status: DC
  Administered 2016-07-02 – 2016-07-04 (×3): 100 mg via ORAL
  Filled 2016-07-02 (×3): qty 1

## 2016-07-02 MED ORDER — ENOXAPARIN SODIUM 40 MG/0.4ML ~~LOC~~ SOLN
40.0000 mg | SUBCUTANEOUS | Status: DC
  Administered 2016-07-02 – 2016-07-03 (×2): 40 mg via SUBCUTANEOUS
  Filled 2016-07-02 (×2): qty 0.4

## 2016-07-02 MED ORDER — ASPIRIN EC 81 MG PO TBEC
81.0000 mg | DELAYED_RELEASE_TABLET | Freq: Every day | ORAL | Status: DC
  Administered 2016-07-03: 81 mg via ORAL
  Filled 2016-07-02: qty 1

## 2016-07-02 MED ORDER — PNEUMOCOCCAL VAC POLYVALENT 25 MCG/0.5ML IJ INJ
0.5000 mL | INJECTION | INTRAMUSCULAR | Status: AC
  Administered 2016-07-03: 0.5 mL via INTRAMUSCULAR
  Filled 2016-07-02: qty 0.5

## 2016-07-02 MED ORDER — FOLIC ACID 1 MG PO TABS
1.0000 mg | ORAL_TABLET | Freq: Every day | ORAL | Status: DC
  Administered 2016-07-02 – 2016-07-04 (×3): 1 mg via ORAL
  Filled 2016-07-02 (×3): qty 1

## 2016-07-02 MED ORDER — NITROGLYCERIN 0.4 MG SL SUBL
0.4000 mg | SUBLINGUAL_TABLET | SUBLINGUAL | Status: DC | PRN
  Administered 2016-07-02 (×3): 0.4 mg via SUBLINGUAL
  Filled 2016-07-02: qty 1

## 2016-07-02 MED ORDER — GI COCKTAIL ~~LOC~~: 30.0000 mL | Freq: Four times a day (QID) | ORAL | Status: DC | PRN

## 2016-07-02 MED ORDER — ISOSORBIDE MONONITRATE ER 30 MG PO TB24
15.0000 mg | ORAL_TABLET | Freq: Every day | ORAL | Status: DC
  Administered 2016-07-02 – 2016-07-04 (×3): 15 mg via ORAL
  Filled 2016-07-02 (×3): qty 1

## 2016-07-02 MED ORDER — LORAZEPAM 2 MG/ML IJ SOLN: 1.0000 mg | Freq: Four times a day (QID) | INTRAMUSCULAR | Status: DC | PRN

## 2016-07-02 MED ORDER — ADULT MULTIVITAMIN W/MINERALS CH
1.0000 | ORAL_TABLET | Freq: Every day | ORAL | Status: DC
  Administered 2016-07-02 – 2016-07-04 (×3): 1 via ORAL
  Filled 2016-07-02 (×3): qty 1

## 2016-07-02 MED ORDER — ONDANSETRON HCL 4 MG/2ML IJ SOLN: 4.0000 mg | Freq: Four times a day (QID) | INTRAMUSCULAR | Status: DC | PRN

## 2016-07-02 MED ORDER — MORPHINE SULFATE (PF) 2 MG/ML IV SOLN
2.0000 mg | INTRAVENOUS | Status: DC | PRN
Start: 2016-07-02 — End: 2016-07-04

## 2016-07-02 MED ORDER — ATORVASTATIN CALCIUM 40 MG PO TABS
40.0000 mg | ORAL_TABLET | Freq: Every day | ORAL | Status: DC
  Administered 2016-07-03: 40 mg via ORAL
  Filled 2016-07-02: qty 1

## 2016-07-02 MED ORDER — SODIUM CHLORIDE 0.9 % IV SOLN
INTRAVENOUS | Status: DC
  Administered 2016-07-02: 75 mL/h via INTRAVENOUS

## 2016-07-02 MED ORDER — LORAZEPAM 1 MG PO TABS: 1.0000 mg | ORAL_TABLET | Freq: Four times a day (QID) | ORAL | Status: DC | PRN

## 2016-07-02 MED ORDER — LISINOPRIL 5 MG PO TABS
5.0000 mg | ORAL_TABLET | Freq: Every day | ORAL | Status: DC
  Administered 2016-07-03 – 2016-07-04 (×2): 5 mg via ORAL
  Filled 2016-07-02 (×2): qty 1

## 2016-07-02 MED ORDER — ONDANSETRON HCL 4 MG/2ML IJ SOLN
4.0000 mg | Freq: Once | INTRAMUSCULAR | Status: AC
  Administered 2016-07-02: 4 mg via INTRAVENOUS
  Filled 2016-07-02: qty 2

## 2016-07-02 MED ORDER — HYDROMORPHONE HCL 1 MG/ML IJ SOLN
1.0000 mg | Freq: Once | INTRAMUSCULAR | Status: AC
  Administered 2016-07-02: 1 mg via INTRAVENOUS
  Filled 2016-07-02: qty 1

## 2016-07-02 NOTE — ED Notes (Signed)
Pt ambulated self efficiently to restroom with no difficulty. 

## 2016-07-02 NOTE — ED Provider Notes (Signed)
Patient turned over to me for recheck of troponin. 3 hour troponin was ordered and is still normal. However when I went into notify the patient of the lab results he stated that he had started with chest pain substernally again about 2 hours prior. Patient's had a history of non-STEMI in the past however did have cardiac cath in 2017 without significant disease so the non-STEMI may have been secondary to vasospasm. Patient will require admission because of the recurrent chest pain. Internal medicine teaching service will admit the patient. For chest pain rule out.   Fredia Sorrow, MD 07/02/16 2007

## 2016-07-02 NOTE — ED Notes (Signed)
Patient transported to X-ray 

## 2016-07-02 NOTE — ED Provider Notes (Signed)
Emergency Department Provider Note   I have reviewed the triage vital signs and the nursing notes.   HISTORY  Chief Complaint Chest Pain   HPI Andres Boyd is a 61 y.o. male with PMH of CAD s/p with cath in 2017 showing only mild disease presents to the emergency department for evaluation of chest discomfort. The patient's chest pain started earlier today. He describes it as a central pressure with no radiation. He called EMS and was given aspirin at which point the pain subsided. He is currently pain-free. No shortness of breath. No fevers or chills. He has a history of NSTEMI and cath in 2017 which showed only mild disease. There is some consideration that the patient's pain and enzyme elevation was secondary to coronary artery spasm or Takatsubo variant.   Past Medical History:  Diagnosis Date  . Coronary artery disease   . Hyperlipidemia   . Hypertension   . Myocardial infarct [facility]) 07/2015    Patient Active Problem List   Diagnosis Date Noted  . Chest pain 02/10/2016  . Claudication ([place]) 02/10/2016  . Hypotension 07/21/2015  . History of prolonged Q-T interval on ECG 07/21/2015  . Mild left ventricular systolic dysfunction 78/29/5621  . Coronary artery disease 07/20/2015  . HLD (hyperlipidemia) 07/19/2015  . Tobacco use disorder 07/19/2015  . NSTEMI (non-ST elevated myocardial infarction) ([place]) 07/18/2015    Past Surgical History:  Procedure Laterality Date  . CARDIAC CATHETERIZATION N/A 07/19/2015   Procedure: Left Heart Cath and Coronary Angiography;  Surgeon: Jettie Booze, MD;  Location: [facility];  Service: Cardiovascular;  Laterality: N/A;    Current Outpatient Rx  . Order #: [id number] Class: Historical Med  . Order #: [id number] Class: Normal  . Order #: [id number] Class: Normal  . Order #: [id number] Class: Normal  . Order #: [id number] Class: Historical Med  . Order #: [id number] Class: Sample  . Order #: [id number] Class: Normal     Allergies Patient has no known allergies.  Family History  Problem Relation Age of Onset  . Diabetes Mother   . Hypertension Mother   . Diabetes Father     Social History Social History  Substance Use Topics  . Smoking status: Current Every Day Smoker    Packs/day: 0.75    Types: Cigarettes  . Smokeless tobacco: Never Used  . Alcohol use 7.2 oz/week    12 Cans of beer per week     Comment: 3-4 times per week    Review of Systems  Constitutional: No fever/chills Eyes: No visual changes. ENT: No sore throat. Cardiovascular: Positive chest pain. Respiratory: Denies shortness of breath. Gastrointestinal: No abdominal pain.  No nausea, no vomiting.  No diarrhea.  No constipation. Genitourinary: Negative for dysuria. Musculoskeletal: Negative for back pain. Skin: Negative for rash. Neurological: Negative for headaches, focal weakness or numbness.  10-point ROS otherwise negative.  ____________________________________________   PHYSICAL EXAM:  VITAL SIGNS: ED Triage Vitals [07/02/16 1410]  Enc Vitals Group     BP 125/78     Pulse Rate 60     Resp 18     Temp 98.8 F (37.1 C)     Temp Source Oral     SpO2 100 %   Constitutional: Alert and oriented. Well appearing and in no acute distress. Eyes: Conjunctivae are normal.  Head: Atraumatic. Nose: No congestion/rhinnorhea. Mouth/Throat: Mucous membranes are moist.  Neck: No stridor.  Cardiovascular: Normal rate, regular rhythm. Good peripheral circulation. Grossly normal heart sounds.   Respiratory: Normal respiratory effort.  No retractions. Lungs CTAB. Gastrointestinal: Soft and nontender. No distention.  Musculoskeletal: No lower extremity tenderness nor edema. No gross deformities of extremities. Neurologic:  Normal speech and language. No gross focal neurologic deficits are appreciated.  Skin:  Skin is warm, dry and intact. No rash noted.  ____________________________________________   LABS (all  labs ordered are listed, but only abnormal results are displayed)  Labs Reviewed  BASIC METABOLIC PANEL - Abnormal; Notable for the following:       Result Value   Sodium 134 (*)    Glucose, Bld 112 (*)    All other components within normal limits  CBC  TROPONIN I  I-STAT TROPOININ, ED  I-STAT TROPOININ, ED   ____________________________________________  EKG   EKG Interpretation  Date/Time:  Sunday July 02 2016 14:05:38 EDT Ventricular Rate:  57 PR Interval:  216 QRS Duration: 98 QT Interval:  438 QTC Calculation: 426 R Axis:   82 Text Interpretation:  Sinus bradycardia with 1st degree A-V block Otherwise normal ECG No STEMI. Similar to prior.  Confirmed by Nanda Quinton [phone]) on 07/02/2016 2:29:23 PM       ____________________________________________  RADIOLOGY  Dg Chest 2 View  Result Date: 07/02/2016 CLINICAL DATA:  Right-sided chest pain and emesis beginning this morning. EXAM: CHEST  2 VIEW COMPARISON:  07/21/2015 FINDINGS: Heart size is normal. Mediastinal shadows are normal. The lungs are clear. No bronchial thickening. No infiltrate, mass, effusion or collapse. Pulmonary vascularity is normal. No bony abnormality. IMPRESSION: Normal chest Electronically Signed   By: Nelson Chimes M.D.   On: 07/02/2016 15:00    ____________________________________________   PROCEDURES  Procedure(s) performed:   Procedures  None ____________________________________________   INITIAL IMPRESSION / ASSESSMENT AND PLAN / ED COURSE  Pertinent labs & imaging results that were available during my care of the patient were reviewed by me and considered in my medical decision making (see chart for details).  Patient with CP now resolved after ASA given by EMS. Cath last year shows mild disease with concern for coronary spasm causing NSTEMI 1 year prior. Plan for Troponin, CXR, and discussion with cardiology given close outpatient follow up and suprisingly normal cath last year.    03:38 PM I reevaluated the patient at which point he informed me he had return of his chest pain. It is a central to right-sided pressure radiating to his back. We will administer nitroglycerin. Blood draw proving difficult so labs pending.   Spoke with Cardiology Dr. Cathie Olden who agrees with plan for serial biomarkers and follow up in office with Dr. Meda Coffee.   Initial troponin and labs are negative. Care transferred to Dr. Rogene Houston who will follow repeat troponin. Anticipate discharge if negative.  ____________________________________________  FINAL CLINICAL IMPRESSION(S) / ED DIAGNOSES  Final diagnoses:  Precordial chest pain     MEDICATIONS GIVEN DURING THIS VISIT:  Medications  nitroGLYCERIN (NITROSTAT) SL tablet 0.4 mg (0.4 mg Sublingual Given 07/02/16 1610)     NEW OUTPATIENT MEDICATIONS STARTED DURING THIS VISIT:  None   Note:  This document was prepared using Dragon voice recognition software and may include unintentional dictation errors.  Nanda Quinton, MD Emergency Medicine    Lucciano Vitali, [facility], MD 07/02/16 [phone]

## 2016-07-02 NOTE — H&P (Signed)
Date: 07/02/2016               Patient Name:  Andres Boyd MRN: [id number]  DOB: March 09, 1955 Age / Sex: 61 y.o., male   PCP: Patient, No Pcp Per         Medical Service: Internal Medicine Teaching Service         Attending Physician: Dr. Lynnae January, Real Cons, MD    First Contact: Dr. Trilby Drummer  Pager: [phone]  Second Contact: Dr. Benjamine Mola Pager: [phone]       After Hours (After 5p/  First Contact Pager: [phone]  weekends / holidays): Second Contact Pager: [phone]   Chief Complaint: Chest pain   History of Present Illness: Andres Boyd is a 61 yo male with history of CAD, NSTEMI 2017, HTN, and HLD who presents to the ED with right-sided chest pain. He reports the pain started this morning on the right side of his chest while he was walking to the bathroom. Pain was preceded by abdominal discomfort,  N/V, lightheadedness, and diaphoresis that he believes to be associated with increased EtOH intake the night before. He describes it as a dull ache, radiating to his back, and as a 8/10. Pain was aggravated by exertion and relieved by rest and high dose aspirin. States chest pain from NSTEMI last year felt different (sharper). Patient also reports cough productive of clear sputum that started this morning and has since resolved. He has been in his usual state of health prior to today.   On arrival to the ED, he was afebrile, bradycardic 56-59, and normotensive. Troponin 0.07. EKG showed no signs of active ischemia and CXR clear.  He received sublingual nitro x3 with minimal relief. He also received Dilaudid 1mg  with complete relief of chest pain.   No active chest pain currently. Denies orthopnea, PND SOB, cough, abdominal pain, N/V, constipation, and LE edema.    Meds:  Current Meds  Medication Sig   aspirin 325 MG tablet Take 325 mg by mouth daily as needed for headache (pain).   atorvastatin (LIPITOR) 40 MG tablet Take 1 tablet (40 mg total) by mouth daily at 6 PM.   isosorbide  mononitrate (IMDUR) 30 MG 24 hr tablet Take 0.5 tablets (15 mg total) by mouth daily.   lisinopril (PRINIVIL,ZESTRIL) 5 MG tablet Take 1 tablet (5 mg total) by mouth daily.   Multiple Vitamin (MULTIVITAMIN WITH MINERALS) TABS tablet Take 1 tablet by mouth daily.     Allergies: Allergies as of 07/02/2016   (No Known Allergies)   Past Medical History:  Diagnosis Date   1st degree AV block    Coronary artery disease    ETOH abuse    Hyperlipidemia    Hypertension    Myocardial infarct ([place]) 07/2015   Tobacco abuse    Past Surgical History:  Procedure Laterality Date   CARDIAC CATHETERIZATION N/A 07/19/2015   Procedure: Left Heart Cath and Coronary Angiography;  Surgeon: Jettie Booze, MD;  Location: [facility];  Service: Cardiovascular;  Laterality: N/A;    Family History:  Family History  Problem Relation Age of Onset   Diabetes Mother    Hypertension Mother    Diabetes Father     Social History:  Social History   Social History   Marital status: Single    Spouse name: N/A   Number of children: N/A   Years of education: N/A   Social History Main Topics   Smoking status: Current Every Day Smoker  Packs/day: 1.00    Years: 36.00    Types: Cigarettes   Smokeless tobacco: Never Used   Alcohol use 1.2 - 1.8 oz/week    2 - 3 Cans of beer per week     Comment: 2-3 32oz cans daily   Drug use: Yes    Types: Marijuana   Sexual activity: Not Asked   Other Topics Concern   None   Social History Narrative   Patient unemployed. Lives in [place] with son.     Review of Systems: A complete ROS was negative except as per HPI.   Physical Exam: Blood pressure (!) 141/99, pulse 69, temperature 98.8 F (37.1 C), temperature source Oral, resp. rate 18, SpO2 94 %.  General: pleasant male, lying in bed in no acute distress  HENT: NCAT, neck supple and FROM, OP clear  Eyes: anicteric sclera, PERRL Heart: regular rate and rhythm,  nl S1/S2, no murmurs, rubs or gallops appreciated, 2+ DPs, pain not reproducible ob exam  Pulm: CTAB, no wheezes or crackles, no increased work of breathing  Abd: soft, NTND  Neuro: A&O x3, no focal deficits  Psych: normal affect     CBC CBC Latest Ref Rng & Units 07/02/2016 02/10/2016 07/21/2015  WBC 4.0 - 10.5 K/uL 6.3 4.5 6.6  Hemoglobin 13.0 - 17.0 g/dL 16.1 14.3 14.1  Hematocrit 39.0 - 52.0 % 46.2 42.5 42.5  Platelets 150 - 400 K/uL 252 255 221   BMP Latest Ref Rng & Units 07/02/2016 02/10/2016 07/21/2015  Glucose 65 - 99 mg/dL 112(H) 91 103(H)  BUN 6 - 20 mg/dL 13 11 16   Creatinine 0.61 - 1.24 mg/dL 1.04 1.07 1.16  BUN/Creat Ratio 10 - 24 - 10 -  Sodium 135 - 145 mmol/L 134(L) 139 133(L)  Potassium 3.5 - 5.1 mmol/L 4.6 4.4 3.4(L)  Chloride 101 - 111 mmol/L 102 99 105  CO2 22 - 32 mmol/L 22 24 24   Calcium 8.9 - 10.3 mg/dL 9.7 9.3 7.8(L)   Troponin 0.07  EKG: bradycardic, 1st degree AV block (PR interval 230ms), no ischemic changes appreciated, unchanged from prior 02/10/2016  CXR: clear lung fields, no enlargement of cardiac silhouette   Assessment & Plan by Problem:  1. Chest pain: Hx of NSTEMI 07/2016, CAD, HTN, HLD, and current smoker. Presented with dull, R-sided CP associated with lightheadedness, abdominal pain, N/V and diaphoresis. Troponin 0.07. No ischemic changes on EKG and clear CXR. Chest pain is atypical, but given risk factors and elevated troponin high suspicion for ACS. [place] 2017 showed 25% stenoses of RCA, ramus and prox cx, and 50% diagonal arteries. Troponin peaked at 2.0 at that time. Per cards note 04/12/2016, suspicion of coronary vasospasms vs. Takatsobu given mild CAD on cath. GERD and alcoholic gastritis also possible given history of alcohol abuse and onset of chest pain after abdominal discomfort and N/V. Will admit to obs for chest pain r/o. Will make NPO at midnight in anticipation of possible stress test tomorrow.  - Repeat EKG in AM - Trend troponin  - Risk  stratify: A1c (5.7 07/22/2015), lipid panel  - TSH normal 02/2016 - Resume home imdur 15mg  QD and ASA 81mg  QD  - Cards consult in AM  - On telemetry  - Morphine 2mg  PRN + SL Nitro 0.4mg  PRN for pain  - GI cocktail  - Zofran 4mg  PRN for nausea   2. HTN: currently normotensive  - Resume home lisinopril 5mg  QD in AM   3. HLD - Lipid panel as above -  Resume home atorvastatin 40mg  QD tonight   4. Tobacco use disorder:  Everyday smoker  - Nicotine patch 14mg    5. Alcohol use disorder: no history of withdrawal symptoms in the past  - UDS  - CIWA protocol   6. 1st degree AV block: incidental finding on EKG  IVF: none  Diet: HH--> NPO at MN PPx: lovenox  Code status: Full code    Dispo: Admit patient to Observation with expected length of stay less than 2 midnights.  Signed: Welford Roche, MD Internal Medicine PGY-1 P [phone]

## 2016-07-02 NOTE — ED Triage Notes (Signed)
Per EMS- pt here c.o. Chest pain 6/10. Given 324 Asprin. Chest pain subsided. 22 G PIV R hand  Hx of MI

## 2016-07-03 ENCOUNTER — Observation Stay (HOSPITAL_COMMUNITY): Payer: Medicaid Other

## 2016-07-03 ENCOUNTER — Other Ambulatory Visit: Payer: Self-pay

## 2016-07-03 DIAGNOSIS — F1010 Alcohol abuse, uncomplicated: Secondary | ICD-10-CM | POA: Diagnosis present

## 2016-07-03 DIAGNOSIS — I10 Essential (primary) hypertension: Secondary | ICD-10-CM | POA: Diagnosis present

## 2016-07-03 DIAGNOSIS — R748 Abnormal levels of other serum enzymes: Secondary | ICD-10-CM

## 2016-07-03 DIAGNOSIS — F17210 Nicotine dependence, cigarettes, uncomplicated: Secondary | ICD-10-CM | POA: Diagnosis present

## 2016-07-03 DIAGNOSIS — R072 Precordial pain: Secondary | ICD-10-CM | POA: Diagnosis present

## 2016-07-03 DIAGNOSIS — I2510 Atherosclerotic heart disease of native coronary artery without angina pectoris: Secondary | ICD-10-CM | POA: Diagnosis present

## 2016-07-03 DIAGNOSIS — E785 Hyperlipidemia, unspecified: Secondary | ICD-10-CM | POA: Diagnosis present

## 2016-07-03 DIAGNOSIS — I429 Cardiomyopathy, unspecified: Secondary | ICD-10-CM | POA: Diagnosis present

## 2016-07-03 DIAGNOSIS — I252 Old myocardial infarction: Secondary | ICD-10-CM | POA: Diagnosis not present

## 2016-07-03 DIAGNOSIS — Z8249 Family history of ischemic heart disease and other diseases of the circulatory system: Secondary | ICD-10-CM | POA: Diagnosis not present

## 2016-07-03 DIAGNOSIS — I440 Atrioventricular block, first degree: Secondary | ICD-10-CM | POA: Diagnosis present

## 2016-07-03 DIAGNOSIS — Z833 Family history of diabetes mellitus: Secondary | ICD-10-CM | POA: Diagnosis not present

## 2016-07-03 DIAGNOSIS — Z23 Encounter for immunization: Secondary | ICD-10-CM | POA: Diagnosis not present

## 2016-07-03 DIAGNOSIS — R0789 Other chest pain: Secondary | ICD-10-CM | POA: Diagnosis present

## 2016-07-03 LAB — TROPONIN I
Troponin I: 0.31 ng/mL (ref <0.03)
Troponin I: 0.16 ng/mL (ref <0.03)

## 2016-07-03 LAB — LIPID PANEL
Cholesterol: 120 mg/dL (ref 0–200)
HDL: 49 mg/dL (ref >40)
LDL Cholesterol: 54 mg/dL (ref 0–99)
TRIGLYCERIDES: 86 mg/dL (ref <150)
Total CHOL/HDL Ratio: 2.4 RATIO
VLDL: 17 mg/dL (ref 0–40)

## 2016-07-03 LAB — RAPID URINE DRUG SCREEN, HOSP PERFORMED
Amphetamines: NOT DETECTED
BARBITURATES: NOT DETECTED
BENZODIAZEPINES: NOT DETECTED
Cocaine: NOT DETECTED
Opiates: POSITIVE — AB
Tetrahydrocannabinol: POSITIVE — AB

## 2016-07-03 LAB — NM MYOCAR MULTI W/SPECT W/WALL MOTION / EF
CHL CUP RESTING HR STRESS: 57 {beats}/min
CSEPHR: 57 %
CSEPPHR: 91 {beats}/min
MPHR: 159 {beats}/min
TID: 1.34

## 2016-07-03 MED ORDER — TECHNETIUM TC 99M TETROFOSMIN IV KIT
30.0000 | PACK | Freq: Once | INTRAVENOUS | Status: AC | PRN
  Administered 2016-07-03: 30 via INTRAVENOUS

## 2016-07-03 MED ORDER — TECHNETIUM TC 99M TETROFOSMIN IV KIT
10.0000 | PACK | Freq: Once | INTRAVENOUS | Status: AC | PRN
  Administered 2016-07-03: 10 via INTRAVENOUS

## 2016-07-03 MED ORDER — REGADENOSON 0.4 MG/5ML IV SOLN
0.4000 mg | Freq: Once | INTRAVENOUS | Status: AC
  Administered 2016-07-03: 0.4 mg via INTRAVENOUS
  Filled 2016-07-03: qty 5

## 2016-07-03 MED ORDER — REGADENOSON 0.4 MG/5ML IV SOLN
INTRAVENOUS | Status: AC
  Filled 2016-07-03: qty 5

## 2016-07-03 MED ORDER — SODIUM CHLORIDE 0.9 % IV SOLN
INTRAVENOUS | Status: DC
  Administered 2016-07-03 – 2016-07-04 (×3): via INTRAVENOUS

## 2016-07-03 NOTE — Progress Notes (Signed)
Patient has signed consent form for cardiac catheterization; placed in shadow chart.

## 2016-07-03 NOTE — Consult Note (Signed)
Cardiology Consultation:   Patient ID: Arham Symmonds; [id number]; [birth year]   Admit date: 07/02/2016 Date of Consult: 07/03/2016  Primary Care Provider: Patient, No Pcp Per Primary Cardiologist: Dr. Meda Coffee    Patient Profile:   Antonin Meininger is a 61 y.o. male with a hx of HTN, HLD, mild non obstructive CAD, QT prolongation, tobacco and alcohol abuse who is being seen today for the evaluation of chest pain and elevated troponin at the request of Dr. Lynnae January.   Hx of NSTEMI in 07/2015. Peak of troponin 2. He underwent cardiac cath and only had mild plaque in mildly reduced EF, could be Takatsubo variant vs coronary spasm medical management recommended. EF was 40-45%.   He was doing well on cardiac stand point when last seen by Dr. Meda Coffee 04/12/16. Echo 04/27/16 showed improved LVEF of 50-55%, grade 1 DD.   He has been walking couple of miles every day without any angina or dyspnea.   History of Present Illness:   Mr. Mummert presented yesterday morning with R sided "stabbing" chest pain. He also had abdomina discomfort, nausea, dizziness, vomiting and diaphoresis. He has been drinking more then usual for the past few days prior to presentation (lately drinks 6-8 beers & 2 full glass of wine every night). Pain exacerbated with ambulatation leading to ER evaluation. His pain did not resolved with SL nitro x 3 in ER. Did resolved with IV dilaudid.  He thinks this likely due to "heartburn due to excessive drinking". His pain is different than prior NSTEMI pain. Chest pain free since admission.   EKG this morning showed sinus bradycardia with tall peaked T wave and 1st degree AV block. No acute changes - personally reviewed. Telemetry showed sinus bradycardia at rate of 50s- personally reviewed. Troponin trend 0.07-->0.23-->0.31-->0.16. LDL 54.  CXR normal. UDS positive for marijuana and opioids. Currently smokes 1/2 pack/day.   Past Medical History:  Diagnosis Date  . 1st degree AV block   . Coronary  artery disease   . ETOH abuse   . Hyperlipidemia   . Hypertension   . Myocardial infarct ([place]) 07/2015  . Tobacco abuse     Past Surgical History:  Procedure Laterality Date  . CARDIAC CATHETERIZATION N/A 07/19/2015   Procedure: Left Heart Cath and Coronary Angiography;  Surgeon: Jettie Booze, MD;  Location: [facility];  Service: Cardiovascular;  Laterality: N/A;     Inpatient Medications: Scheduled Meds: . aspirin EC  81 mg Oral Daily  . atorvastatin  40 mg Oral q1800  . enoxaparin (LOVENOX) injection  40 mg Subcutaneous Q24H  . folic acid  1 mg Oral Daily  . isosorbide mononitrate  15 mg Oral Daily  . lisinopril  5 mg Oral Daily  . multivitamin with minerals  1 tablet Oral Daily  . nicotine  14 mg Transdermal Daily  . thiamine  100 mg Oral Daily   Or  . thiamine  100 mg Intravenous Daily   Continuous Infusions: . sodium chloride 100 mL/hr at 07/03/16 0715   PRN Meds: acetaminophen, gi cocktail, LORazepam **OR** LORazepam, morphine injection, nitroGLYCERIN, ondansetron (ZOFRAN) IV  Allergies:   No Known Allergies  Social History:   Social History   Social History  . Marital status: Single    Spouse name: N/A  . Number of children: N/A  . Years of education: N/A   Occupational History  . Not on file.   Social History Main Topics  . Smoking status: Current Every Day Smoker  Packs/day: 1.00    Years: 36.00    Types: Cigarettes  . Smokeless tobacco: Never Used  . Alcohol use 1.2 - 1.8 oz/week    2 - 3 Cans of beer per week     Comment: 2-3 32oz cans daily  . Drug use: Yes    Types: Marijuana  . Sexual activity: Not on file   Other Topics Concern  . Not on file   Social History Narrative   Patient unemployed. Lives in [place] with son.     Family History:   The patient's family history includes Diabetes in his father and mother; Hypertension in his mother.  ROS:  Please see the history of present illness.  ROS All other ROS  reviewed and negative.     Physical Exam/Data:   Vitals:   07/02/16 2333 07/03/16 0322 07/03/16 0532 07/03/16 0800  BP: 100/64 102/80 103/67 107/65  Pulse: 61 (!) 59 60 (!) 59  Resp: 16 17  18   Temp: 97.7 F (36.5 C) 98.1 F (36.7 C)  98.2 F (36.8 C)  TempSrc: Oral Oral  Oral  SpO2: 100% 100%  100%  Weight:  178 lb 3.2 oz (80.8 kg)    Height:  6\' 1"  (1.854 m)      Intake/Output Summary (Last 24 hours) at 07/03/16 1108 Last data filed at 07/03/16 0600  Gross per 24 hour  Intake              120 ml  Output              250 ml  Net             -130 ml   Filed Weights   07/02/16 2113 07/03/16 0322  Weight: 178 lb 14.4 oz (81.1 kg) 178 lb 3.2 oz (80.8 kg)   Body mass index is 23.51 kg/m.  General:  Well nourished, well developed, in no acute distress HEENT: normal Lymph: no adenopathy Neck: no JVD Endocrine:  No thryomegaly Vascular: No carotid bruits; FA pulses 2+ bilaterally without bruits  Cardiac:  normal S1, S2; RRR; no murmur  Lungs:  clear to auscultation bilaterally, no wheezing, rhonchi or rales  Abd: soft, nontender, no hepatomegaly  Ext: no edema Musculoskeletal:  No deformities, BUE and BLE strength normal and equal Skin: warm and dry  Neuro:  CNs 2-12 intact, no focal abnormalities noted Psych:  Normal affect    Relevant CV Studies: Left Heart Cath and Coronary Angiography  07/2015  Conclusion    Mid RCA lesion, 25% stenosed.  Ramus lesion, 25% stenosed.  Ost 2nd Diag to 2nd Diag lesion, 50% stenosed.  Prox Cx lesion, 25% stenosed.  There is mild left ventricular systolic dysfunction with hypokinesis noted at the base and normal contraction at the apex. Could be a Takatsubo variant.   No clear culprit lesion for elevated troponin.  Continue medical therapy including blood pressure control and tobacco cessation.   Diagnostic Diagram        Laboratory Data:  Chemistry Recent Labs Lab 07/02/16 1541  NA 134*  K 4.6  CL 102  CO2 22    GLUCOSE 112*  BUN 13  CREATININE 1.04  CALCIUM 9.7  GFRNONAA >60  GFRAA >60  ANIONGAP 10    Hematology Recent Labs Lab 07/02/16 1541  WBC 6.3  RBC 4.87  HGB 16.1  HCT 46.2  MCV 94.9  MCH 33.1  MCHC 34.8  RDW 12.8  PLT 252   Cardiac Enzymes Recent Labs Lab  07/02/16 1851 07/02/16 2153 07/03/16 0402 07/03/16 0924  TROPONINI 0.07* 0.23* 0.31* 0.16*    Recent Labs Lab 07/02/16 1550 07/02/16 1852  TROPIPOC 0.00 0.04    Radiology/Studies:  Dg Chest 2 View  Result Date: 07/02/2016 CLINICAL DATA:  Right-sided chest pain and emesis beginning this morning. EXAM: CHEST  2 VIEW COMPARISON:  07/21/2015 FINDINGS: Heart size is normal. Mediastinal shadows are normal. The lungs are clear. No bronchial thickening. No infiltrate, mass, effusion or collapse. Pulmonary vascularity is normal. No bony abnormality. IMPRESSION: Normal chest Electronically Signed   By: Nelson Chimes M.D.   On: 07/02/2016 15:00    Assessment and Plan:   1. Chest pain/ Elevated troponin - His chest pain sounds non cardiac. Likely GI etiology as hx of excessive drinking and pain is different from prior cardiac pain. However troponin peaked at 0.31, now trending down. He is chest pain free since admission and EKG without acute changes.  - Will review plan with MD. Keep NPO. Cath 07/2015 showed mild plaque as noted above.   2. Tobacco and alcohol abuse - Advised cessation. Education given.   3. Sinus Bradycardia with 1st degree AV block - Asymptomatic. Avoid AV blocking agent.    Signed, Leanor Kail, PA  07/03/2016 11:08 AM   As above, patient seen and examined. Briefly he is a 61 year old male with past medical history of hypertension, hyperlipidemia, nonobstructive coronary disease, tobacco and alcohol abuse for evaluation of chest pain and elevated troponin. Patient had a non-ST elevation myocardial infarction in July 2017. Catheterization revealed no obstructive coronary disease and ejection  fraction 40-45%. Follow-up echo in April 2018 showed ejection fraction 50-55%. He typically does not have dyspnea on exertion, orthopnea, PND, pedal edema, exertional chest pain or syncope. He has not traveled recently and had no recent surgeries. He consumed a large amount of alcohol Saturday night. Sunday morning he awoke with nausea and retching. He developed pain in the right chest area described as an aching sensation. It would come for minutes and then resolve. This continued for 1 hour and he came to the emergency room. Note he did not have dyspnea or diaphoresis. The pain was not pleuritic or positional. He has had no further pain. Troponin initially was 0.07 and increased to 0.31. Electrocardiogram shows sinus bradycardia, first-degree AV block and prolonged QT interval.  1 chest pain-symptoms are atypical. They occurred after retching and could be GI in etiology. He does not have risk factors for pulmonary embolus. His troponin is elevated and increased to 0.31. We will arrange a stress nuclear study for risk stratification. If normal or low risk he can be discharged with outpatient follow-up with Dr Meda Coffee.  2 Tobacco/alcohol abuse-patient educated on discontinuing.  3 hypertension-blood pressure is controlled. Continue present medications.  4 hyperlipidemia-continue statin.  Kirk Ruths, MD

## 2016-07-03 NOTE — Care Management Note (Signed)
Case Management Note  Patient Details  Name: Ritik Stavola MRN: [id number] Date of Birth: 06/01/1955  Subjective/Objective:   Admitted with Chest Pain              Action/Plan: Patient lives at home with his son Elberta Fortis; No PCP, no Insurance; patient stated that he has applied for Medicaid; pharmacy of choice is [facility] but stated that he has changed pharmacy due to cost. Patient is independent of his ADL's; continues to smoke, drink and use THC; CM talked to patient about this and he states that he is quitting. Financial Counselor to see patient. At discharge, patient can follow up with the [facility] for ongoing care; CM will continue to follow for DCP.  Expected Discharge Date:  07/05/16               Expected Discharge Plan:  Home/Self Care  In-House Referral:  Financial Counselor  Discharge planning Services  CM Consult  Status of Service:  In process, will continue to follow  Sherrilyn Rist [phone] 07/03/2016, 11:02 AM

## 2016-07-03 NOTE — Progress Notes (Signed)
Troponin 0.31, earlier was 0.23, no s/s, MD aware, will continue to monitor, Thanks Arvella Nigh RN.

## 2016-07-03 NOTE — Progress Notes (Signed)
Subjective: Mr. Hair is in good spirits and  feeling better today. He states that this pain was different than is previous cardiac chest pain. He does not currently have a PCP.  Objective:  Vital signs in last 24 hours: Vitals:   07/03/16 1353 07/03/16 1354 07/03/16 1356 07/03/16 1359  BP: 129/78 125/72 114/74   Pulse: 80 83 82 79  Resp:      Temp:      TempSrc:      SpO2:      Weight:      Height:       Physical Exam  Constitutional: He is oriented to person, place, and time. He appears well-developed and well-nourished.  Cardiovascular: Normal rate and regular rhythm.  Exam reveals no gallop and no friction rub.   No murmur heard. Pulmonary/Chest: Effort normal and breath sounds normal. He has no wheezes. He has no rales.  Abdominal: Soft. Bowel sounds are normal. He exhibits no distension.  Musculoskeletal: He exhibits no edema or tenderness.  Neurological: He is alert and oriented to person, place, and time.  Skin: Skin is warm and dry.   LABS/IMAGING - Troponin I: 0.16 (down from 5 hours previous of 0.31) - UDS: Positive for THC and Opiates (following administration of pain medication)  - EKG: Sinus Bradycardia @ 56 BPM, 1st degree AV block, mildly prolonged QT  - Nuclear Stress Test:   - No ST segment deviation during stress  - Large size, moderate intensity partially reversible inferior, inferoseptal apical inferior and apical perfusion defect (SDS 5) - suggestive of scar with mild peri-infarct ischemia.  - LVEF 52% with mild inferior hypokinesis.  - TID ratio is elevated at 1.34.  - This is an intermediate risk study   Assessment/Plan:  Principal Problem:   Chest pain Active Problems:   HLD (hyperlipidemia)   Hypertension   Tobacco use disorder   Coronary artery disease   ETOH abuse   AV block, 1st degree  61 yo male with history of CAD, NSTEMI 2017, HTN, and HLD who presented to the ED with right-sided chest pain.  Chest Pain in the setting of  history of NSTEMI and recently heavy alcohol use. Differential includes Cardiac chest pain as well as Alcoholic gastritis and GERD due to history of alcohol use and timing of onset of pain (following N/V and abdominal discomfort) onset of chest pain after abdominal discomfort and N/V. - EKG showed - Troponin peaked and trended down today (0.16 most recently)  - Continue home imdur 15mg  QD and ASA 81mg  QD  - Cards recs  - Nuclear Stress test: Large size, moderate intensity partially reversible inferior, inferoseptal apical inferior and apical perfusion defect (SDS 5) - suggestive of scar with mild peri-infarct ischemia. Intermediate Risk Study.  - Avoid AV blocking agents due to 1st degree AV Block - On telemetry  - Morphine 2mg  PRN + SL Nitro 0.4mg  PRN for pain  - GI cocktail  - Zofran 4mg  PRN for nausea   HTN:  - Continue home lisinopril 5mg  QD in AM   HLD - Continue home atorvastatin 40mg  QD tonight   Tobacco use disorder  - Nicotine patch 14mg    Alcohol use disorder (No history of withdrawal symptoms)  - UDS positive for THC and Opiates (following administration of pain medication)  - CIWA protocol   F/E/N:VF: NS @ 100 ml/hr; Heart Healthy Diet  DVT PPx: lovenox   Code status: Full code   Dispo: Anticipated discharge in approximately 1-2 days.  Pearson Grippe, DO IM PGY-1 Pager: [phone]

## 2016-07-03 NOTE — Progress Notes (Signed)
    Pt with intermediate risk nuclear stress test: Large size, moderate intensity partially reversible inferior, inferoseptal apical inferior and apical perfusion defect (SDS 5) - suggestive of scar with mild peri-infarct ischemia. LVEF 52% with mild inferior hypokinesis. TID ratio is elevated at 1.34. This is an intermediate risk study. Clinical correlation is recommended.  Nell Range PA discussed with Dr. Stanford Breed who recommends cardiac cath for further definite cardiac evaluation. I have discussed the results and recommendation for cardiac cath with the patient. The patient understands that risks included but are not limited to stroke (1 in 1000), death (1 in 69), kidney failure [usually temporary] (1 in 500), bleeding (1 in 200), allergic reaction [possibly serious] (1 in 200). Questions have been answered and he agrees to proceed with the cath tomorrow. I have placed him on the cath lab schedule and orders have been entered.   Daune Perch, AGNP-C 07/03/2016  5:18 PM Pager: [phone]

## 2016-07-03 NOTE — Progress Notes (Signed)
Received consult to help financially with meds. Financial Counselor to see patient to see what he qualifies for. Patient is for stress test today. CM will follow up; B Pennie Rushing [phone]

## 2016-07-03 NOTE — Progress Notes (Signed)
   Andres Boyd presented for a nuclear stress test today.  No immediate complications.  Stress imaging is pending at this time.  Preliminary EKG findings may be listed in the chart, but the stress test result will not be finalized until perfusion imaging is complete.  Andres [name], PA-C 07/03/2016, 2:03 PM

## 2016-07-04 ENCOUNTER — Encounter (HOSPITAL_COMMUNITY)
Admission: EM | Disposition: A | Discharge status: Home / Self Care | Payer: Self-pay | Source: Home / Self Care | Attending: Internal Medicine

## 2016-07-04 ENCOUNTER — Encounter (HOSPITAL_COMMUNITY): Payer: Self-pay | Admitting: Interventional Cardiology

## 2016-07-04 DIAGNOSIS — R001 Bradycardia, unspecified: Secondary | ICD-10-CM

## 2016-07-04 DIAGNOSIS — Z720 Tobacco use: Secondary | ICD-10-CM

## 2016-07-04 DIAGNOSIS — R7989 Other specified abnormal findings of blood chemistry: Secondary | ICD-10-CM

## 2016-07-04 DIAGNOSIS — I2510 Atherosclerotic heart disease of native coronary artery without angina pectoris: Secondary | ICD-10-CM

## 2016-07-04 DIAGNOSIS — R748 Abnormal levels of other serum enzymes: Secondary | ICD-10-CM

## 2016-07-04 DIAGNOSIS — R778 Other specified abnormalities of plasma proteins: Secondary | ICD-10-CM

## 2016-07-04 HISTORY — PX: LEFT HEART CATH AND CORONARY ANGIOGRAPHY: CATH118249

## 2016-07-04 LAB — BASIC METABOLIC PANEL
Anion gap: 7 (ref 5–15)
BUN: 12 mg/dL (ref 6–20)
CHLORIDE: 105 mmol/L (ref 101–111)
CO2: 24 mmol/L (ref 22–32)
CREATININE: 1.07 mg/dL (ref 0.61–1.24)
Calcium: 8.7 mg/dL — ABNORMAL LOW (ref 8.9–10.3)
GFR calc Af Amer: >60 mL/min (ref >60)
GFR calc non Af Amer: >60 mL/min (ref >60)
Glucose, Bld: 93 mg/dL (ref 65–99)
Potassium: 3.8 mmol/L (ref 3.5–5.1)
Sodium: 136 mmol/L (ref 135–145)

## 2016-07-04 LAB — PROTIME-INR
INR: 1.11
Prothrombin Time: 14.3 seconds (ref 11.4–15.2)

## 2016-07-04 LAB — HEMOGLOBIN A1C
HEMOGLOBIN A1C: 5.6 % (ref 4.8–5.6)
MEAN PLASMA GLUCOSE: 114 mg/dL

## 2016-07-04 SURGERY — LEFT HEART CATH AND CORONARY ANGIOGRAPHY
Anesthesia: LOCAL

## 2016-07-04 MED ORDER — HEPARIN (PORCINE) IN NACL 2-0.9 UNIT/ML-% IJ SOLN
INTRAMUSCULAR | Status: AC | PRN
  Administered 2016-07-04: 1000 mL via INTRA_ARTERIAL

## 2016-07-04 MED ORDER — SODIUM CHLORIDE 0.9 % IV SOLN: INTRAVENOUS | Status: AC

## 2016-07-04 MED ORDER — ACETAMINOPHEN 325 MG PO TABS: 650.0000 mg | ORAL_TABLET | ORAL | Status: DC | PRN

## 2016-07-04 MED ORDER — SODIUM CHLORIDE 0.9% FLUSH: 3.0000 mL | Freq: Two times a day (BID) | INTRAVENOUS | Status: DC

## 2016-07-04 MED ORDER — SODIUM CHLORIDE 0.9 % WEIGHT BASED INFUSION: 3.0000 mL/kg/h | INTRAVENOUS | Status: DC

## 2016-07-04 MED ORDER — LIDOCAINE HCL (PF) 1 % IJ SOLN
INTRAMUSCULAR | Status: DC | PRN
  Administered 2016-07-04: 3 mL via INTRADERMAL

## 2016-07-04 MED ORDER — IOPAMIDOL (ISOVUE-370) INJECTION 76%
INTRAVENOUS | Status: AC
  Filled 2016-07-04: qty 100

## 2016-07-04 MED ORDER — SODIUM CHLORIDE 0.9% FLUSH: 3.0000 mL | INTRAVENOUS | Status: DC | PRN

## 2016-07-04 MED ORDER — ONDANSETRON HCL 4 MG/2ML IJ SOLN: 4.0000 mg | Freq: Four times a day (QID) | INTRAMUSCULAR | Status: DC | PRN

## 2016-07-04 MED ORDER — VERAPAMIL HCL 2.5 MG/ML IV SOLN
INTRAVENOUS | Status: AC
  Filled 2016-07-04: qty 2

## 2016-07-04 MED ORDER — MIDAZOLAM HCL 2 MG/2ML IJ SOLN
INTRAMUSCULAR | Status: DC | PRN
  Administered 2016-07-04: 2 mg via INTRAVENOUS

## 2016-07-04 MED ORDER — LIDOCAINE HCL 1 % IJ SOLN
INTRAMUSCULAR | Status: AC
  Filled 2016-07-04: qty 20

## 2016-07-04 MED ORDER — IOPAMIDOL (ISOVUE-370) INJECTION 76%
INTRAVENOUS | Status: DC | PRN
  Administered 2016-07-04: 50 mL via INTRA_ARTERIAL

## 2016-07-04 MED ORDER — HEPARIN SODIUM (PORCINE) 1000 UNIT/ML IJ SOLN
INTRAMUSCULAR | Status: DC | PRN
  Administered 2016-07-04: 4000 [IU] via INTRAVENOUS

## 2016-07-04 MED ORDER — SODIUM CHLORIDE 0.9 % WEIGHT BASED INFUSION: 1.0000 mL/kg/h | INTRAVENOUS | Status: DC

## 2016-07-04 MED ORDER — SODIUM CHLORIDE 0.9 % IV SOLN: 250.0000 mL | INTRAVENOUS | Status: DC | PRN

## 2016-07-04 MED ORDER — VERAPAMIL HCL 2.5 MG/ML IV SOLN
INTRAVENOUS | Status: DC | PRN
  Administered 2016-07-04: 10 mL via INTRA_ARTERIAL

## 2016-07-04 MED ORDER — ASPIRIN 81 MG PO CHEW: 81.0000 mg | CHEWABLE_TABLET | ORAL | Status: DC

## 2016-07-04 MED ORDER — HEPARIN (PORCINE) IN NACL 2-0.9 UNIT/ML-% IJ SOLN
INTRAMUSCULAR | Status: AC
  Filled 2016-07-04: qty 1000

## 2016-07-04 MED ORDER — ASPIRIN 81 MG PO CHEW
81.0000 mg | CHEWABLE_TABLET | ORAL | Status: AC
  Administered 2016-07-04: 81 mg via ORAL
  Filled 2016-07-04: qty 1

## 2016-07-04 MED ORDER — FENTANYL CITRATE (PF) 100 MCG/2ML IJ SOLN
INTRAMUSCULAR | Status: DC | PRN
  Administered 2016-07-04: 25 ug via INTRAVENOUS

## 2016-07-04 MED ORDER — FENTANYL CITRATE (PF) 100 MCG/2ML IJ SOLN
INTRAMUSCULAR | Status: AC
  Filled 2016-07-04: qty 2

## 2016-07-04 MED ORDER — HEPARIN SODIUM (PORCINE) 1000 UNIT/ML IJ SOLN
INTRAMUSCULAR | Status: AC
  Filled 2016-07-04: qty 1

## 2016-07-04 MED ORDER — MIDAZOLAM HCL 2 MG/2ML IJ SOLN
INTRAMUSCULAR | Status: AC
  Filled 2016-07-04: qty 2

## 2016-07-04 SURGICAL SUPPLY — 11 items
CATH EXPO 5F FL3.5 (CATHETERS) ×2 IMPLANT
CATH EXPO 5FR FR4 (CATHETERS) ×2 IMPLANT
CATH INFINITI 5FR ANG PIGTAIL (CATHETERS) ×2 IMPLANT
DEVICE RAD COMP TR BAND LRG (VASCULAR PRODUCTS) ×2 IMPLANT
GLIDESHEATH SLEND SS 6F .021 (SHEATH) ×2 IMPLANT
GUIDEWIRE INQWIRE 1.5J.035X260 (WIRE) ×1 IMPLANT
INQWIRE 1.5J .035X260CM (WIRE) ×2
KIT HEART LEFT (KITS) ×2 IMPLANT
PACK CARDIAC CATHETERIZATION (CUSTOM PROCEDURE TRAY) ×2 IMPLANT
TRANSDUCER W/STOPCOCK (MISCELLANEOUS) ×2 IMPLANT
TUBING CIL FLEX 10 FLL-RA (TUBING) ×2 IMPLANT

## 2016-07-04 NOTE — Interval H&P Note (Signed)
Cath Lab Visit (complete for each Cath Lab visit)  Clinical Evaluation Leading to the Procedure:   ACS: Yes.    Non-ACS:    Anginal Classification: CCS IV  Anti-ischemic medical therapy: Minimal Therapy (1 class of medications)  Non-Invasive Test Results: No non-invasive testing performed  Prior CABG: No previous CABG      History and Physical Interval Note:  07/04/2016 11:37 AM  Andres Boyd  has presented today for surgery, with the diagnosis of cp, abnormal stress  The various methods of treatment have been discussed with the patient and family. After consideration of risks, benefits and other options for treatment, the patient has consented to  Procedure(s): Left Heart Cath and Coronary Angiography (N/A) as a surgical intervention .  The patient's history has been reviewed, patient examined, no change in status, stable for surgery.  I have reviewed the patient's chart and labs.  Questions were answered to the patient's satisfaction.     Larae Grooms

## 2016-07-04 NOTE — Discharge Summary (Signed)
Name: Andres Boyd MRN: [id number] DOB: Dec 06, 1955 61 y.o. PCP: Patient, No Pcp Per  Date of Admission: 07/02/2016  2:01 PM Date of Discharge: 07/04/2016 Attending Physician: Bartholomew Crews, MD  Discharge Diagnosis:  Principal Problem:   Chest pain Active Problems:   HLD (hyperlipidemia)   Hypertension   Tobacco use disorder   Coronary artery disease   ETOH abuse   AV block, 1st degree   Elevated troponin   Discharge Medications: Allergies as of 07/04/2016   No Known Allergies     Medication List    TAKE these medications   aspirin 325 MG tablet Take 325 mg by mouth daily as needed for headache (pain).   aspirin 81 MG EC tablet Take 1 tablet (81 mg total) by mouth daily.   atorvastatin 40 MG tablet Commonly known as:  LIPITOR Take 1 tablet (40 mg total) by mouth daily at 6 PM.   isosorbide mononitrate 30 MG 24 hr tablet Commonly known as:  IMDUR Take 0.5 tablets (15 mg total) by mouth daily.   lisinopril 5 MG tablet Commonly known as:  PRINIVIL,ZESTRIL Take 1 tablet (5 mg total) by mouth daily.   multivitamin with minerals Tabs tablet Take 1 tablet by mouth daily.   nitroGLYCERIN 0.4 MG SL tablet Commonly known as:  NITROSTAT Place 1 tablet (0.4 mg total) under the tongue every 5 (five) minutes x 3 doses as needed for chest pain.       Disposition and follow-up:   [name] was discharged from [facility] in Good condition.  At the hospital follow up visit please address:  1.  Chest Pain: [name] presented for chest pain rule out. His Laboratory and Procedure results are below. He not on a beta-blocker despite his cardiac history due to baseline bradycardia.   2. Alcohol Use: He was counseled on decreasing alcohol use. Please reassess his interest in reducing use following this hospitalization.  2.  Labs / imaging needed at time of follow-up: None  3.  Pending labs/ test needing follow-up: None  Follow-up  Appointments: Follow-up Information    Dorothy Spark, MD. Call in 1 day(s).   Specialty:  Cardiology Why:  for outpatient appointment Contact information: [place] [address] [phone]           Hospital Course by problem list: Principal Problem:   Chest pain Active Problems:   HLD (hyperlipidemia)   Hypertension   Tobacco use disorder   Coronary artery disease   ETOH abuse   AV block, 1st degree   Elevated troponin   1. Chest Pain: [name] presented following 1 day of 8/10 achy, right-sided chest pain radiating to his back. He experienced associated symptoms of abdominal pain, nausea and vomiting, lightheadedness, and diaphoresis (which he believed was due to his increased alcohol use the night before.) The pain was aggravated by exertion and relieved by rest and Asprin. Nitroglycerin received in the ED did not improve his symptoms. Due to an elevated Troponin (0.07) and a history of NSTEMI in 2017 he was admitted for Chest Pain rule out. His Troponin peaked and trended down overnight. His symptoms resolved with supportive care. Cardiology was consulted and arranged a nuclear stress test for risk stratification. The nuclear stress test revealed intermediate risk and a cath was scheduled for the following day for definitive diagnosis. (See Procedures below for detailed results.) The cath revealed some stenosis but now intervention was indicated and the recommendation  was for medical management. He was not put on a beta-blocker due to his baseline bradycardia.   Discharge Vitals:   BP (!) 93/45   Pulse (!) 58   Temp 98.3 F (36.8 C) (Oral)   Resp 16   Ht 6\' 1"  (1.854 m)   Wt 179 lb 9.6 oz (81.5 kg)   SpO2 100%   BMI 23.70 kg/m   Pertinent Labs, Studies, and Procedures:  Troponin I: - 0.07-->0.23--> 0.31-->0.16 EKG: Sinus Bradycardia @ 57 BPM, 1st degree AV Block, Otherwise Normal Nuclear Stress Test: Large size, moderate intensity  partially reversible inferior, inferoseptal apical inferior and apical perfusion defect (SDS 5) - suggestive of scar with mild peri-infarct ischemia. LVEF 52% with mild inferior hypokinesis. TID ratio is elevated at 1.34. This is an intermediate risk study. Clinical correlation is recommended. Cardiac Catheterization:   - Mid RCA lesion, 25 %stenosed.  - Ramus lesion, 25 %stenosed.  Colon Flattery 2nd Diag to 2nd Diag lesion, 50 %stenosed.  - Prox Cx lesion, 25 %stenosed.  - The left ventricular ejection fraction is 50-55% by visual estimate.  - The left ventricular systolic function is normal.  - LV end diastolic pressure is normal.  - Compared to prior ventriculogram in 2017, the base now contracts well and there is some anterolateral hypokinesis.   Continue medical therapy for BP control and treatment for cardiomyopathy.    Discharge Instructions: Patient was advised that his Chest Pain was not believed to be cardiac in nature.  He was also informed that he would be contacted to schedule a new patient visit with the [facility] as he has no current PCP.  Signed: Pearson Grippe, DO IM PGY-1 Pager: [phone]

## 2016-07-04 NOTE — Discharge Instructions (Signed)
We believe your Chest Pain was not due to a Heart Problem. Your Cardiac Catheterization (the procedure for your heart) showed some narrowing of the blood vessels, but did not require any stents to hold them open.  You expressed interest in having a Primary Care doctor that you can see regularly and who can help you manage your prescriptions. You will receive a call to make an appointment.

## 2016-07-04 NOTE — Progress Notes (Signed)
Subjective: Andres Boyd is feeling well today. He denies chest pain, abdominal pain, N/V. He was aware of the results of his Nuclear Stress test (intermediate risk) and is waiting to have catheterization today (scheduled fo 3pm). He does not crrently have a PCP and expressed interest in becoming a patient at the [facility].  Objective:  Vital signs in last 24 hours: Vitals:   07/03/16 1359 07/03/16 1920 07/04/16 0510 07/04/16 1133  BP:  107/60 92/62   Pulse: 79 (!) 59 (!) 58   Resp:  18 16   Temp:  97.9 F (36.6 C) 97.6 F (36.4 C)   TempSrc:  Oral Oral   SpO2:  98% 100% 100%  Weight:   179 lb 9.6 oz (81.5 kg)   Height:       Physical Exam  Constitutional: He is oriented to person, place, and time. He appears well-developed and well-nourished.  Cardiovascular: Normal rate and regular rhythm.  Exam reveals no gallop and no friction rub.   No murmur heard. Pulmonary/Chest: Effort normal and breath sounds normal. He has no wheezes. He has no rales.  Abdominal: Soft. Bowel sounds are normal. He exhibits no distension.  Musculoskeletal: He exhibits no edema or tenderness.  Neurological: He is alert and oriented to person, place, and time.  Skin: Skin is warm and dry.   LABS/IMAGING - Troponin I: 0.16 (down from 5 hours previous of 0.31) - UDS: Positive for THC and Opiates (following administration of pain medication)  - EKG: Sinus Bradycardia @ 56 BPM, 1st degree AV block, mildly prolonged QT  - Nuclear Stress Test:   - No ST segment deviation during stress  - Large size, moderate intensity partially reversible inferior, inferoseptal apical inferior and apical perfusion defect (SDS 5) - suggestive of scar with mild peri-infarct ischemia.  - LVEF 52% with mild inferior hypokinesis.  - TID ratio is elevated at 1.34.  - This is an intermediate risk study   Assessment/Plan:  Principal Problem:   Chest pain Active Problems:   HLD (hyperlipidemia)  Hypertension   Tobacco use disorder   Coronary artery disease   ETOH abuse   AV block, 1st degree  61 yo male with history of CAD, NSTEMI 2017, HTN, and HLD who presented to the ED with right-sided chest pain.  Chest Pain in the setting of history of NSTEMI and recently heavy alcohol use. Differential includes Cardiac chest pain as well as Alcoholic gastritis and GERD due to history of alcohol use and timing of onset of pain (following N/V and abdominal discomfort) onset of chest pain after abdominal discomfort and N/V. - EKG on 7/2  Showed sinus bradycardia with 1st degree AV block - Troponin peaked and trended down on 7/2 (last was 0.16)  - Continue home imdur 15mg  QD and ASA 81mg  QD  - Cards Consult  - Nuclear Stress test: Large size, moderate intensity partially reversible inferior, inferoseptal apical inferior and apical perfusion defect (SDS 5) - suggestive of scar with mild peri-infarct ischemia. Intermediate Risk Study.  - Cardiac Catheterization scheduled for 3pm on 7/3  - Avoid AV blocking agents due to 1st degree AV Block - On telemetry  - Morphine 2mg  PRN + SL Nitro 0.4mg  PRN for pain  - GI cocktail PRN - Zofran 4mg  PRN for nausea   HTN:  - Continue home lisinopril 5mg  QD in AM   HLD - Continue home atorvastatin 40mg  QD tonight   Tobacco use disorder  - Nicotine patch 14mg   Alcohol use disorder (No history of withdrawal symptoms)  - UDS positive for THC and Opiates (following administration of pain medication)  - CIWA protocol   F/E/N:VF: NS @ 100 ml/hr; Heart Healthy Diet  DVT PPx: lovenox   Code status: Full code   Dispo: Anticipated discharge Today or Tomorrow pending cardiology procedure and reccomendations.   Pearson Grippe, DO IM PGY-1 Pager: [phone]

## 2016-07-04 NOTE — H&P (View-Only) (Signed)
Progress Note  Patient Name: Andres Boyd Date of Encounter: 07/04/2016  Primary Cardiologist: Dr Meda Coffee  Subjective   No chest pain or dyspnea  Inpatient Medications    Scheduled Meds: . aspirin EC  81 mg Oral Daily  . atorvastatin  40 mg Oral q1800  . enoxaparin (LOVENOX) injection  40 mg Subcutaneous Q24H  . folic acid  1 mg Oral Daily  . isosorbide mononitrate  15 mg Oral Daily  . lisinopril  5 mg Oral Daily  . multivitamin with minerals  1 tablet Oral Daily  . nicotine  14 mg Transdermal Daily  . sodium chloride flush  3 mL Intravenous Q12H  . thiamine  100 mg Oral Daily   Or  . thiamine  100 mg Intravenous Daily   Continuous Infusions: . sodium chloride 100 mL/hr at 07/04/16 0843  . sodium chloride    . [START ON 07/05/2016] sodium chloride     Followed by  . [START ON 07/05/2016] sodium chloride     PRN Meds: sodium chloride, acetaminophen, gi cocktail, LORazepam **OR** LORazepam, morphine injection, nitroGLYCERIN, ondansetron (ZOFRAN) IV, sodium chloride flush   Vital Signs    Vitals:   07/03/16 1356 07/03/16 1359 07/03/16 1920 07/04/16 0510  BP: 114/74  107/60 92/62  Pulse: 82 79 (!) 59 (!) 58  Resp:   18 16  Temp:   97.9 F (36.6 C) 97.6 F (36.4 C)  TempSrc:   Oral Oral  SpO2:   98% 100%  Weight:    81.5 kg (179 lb 9.6 oz)  Height:        Intake/Output Summary (Last 24 hours) at 07/04/16 1013 Last data filed at 07/04/16 0845  Gross per 24 hour  Intake             1740 ml  Output             1670 ml  Net               70 ml   Filed Weights   07/02/16 2113 07/03/16 0322 07/04/16 0510  Weight: 81.1 kg (178 lb 14.4 oz) 80.8 kg (178 lb 3.2 oz) 81.5 kg (179 lb 9.6 oz)    Telemetry    Sinus- Personally Reviewed   Physical Exam   GEN: No acute distress.   Neck: No JVD Cardiac: RRR, no murmurs, rubs, or gallops.  Respiratory: Clear to auscultation bilaterally. GI: Soft, nontender, non-distended  MS: No edema Neuro:  Nonfocal  Psych: Normal  affect   Labs    Chemistry Recent Labs Lab 07/02/16 1541 07/04/16 0330  NA 134* 136  K 4.6 3.8  CL 102 105  CO2 22 24  GLUCOSE 112* 93  BUN 13 12  CREATININE 1.04 1.07  CALCIUM 9.7 8.7*  GFRNONAA >60 >60  GFRAA >60 >60  ANIONGAP 10 7     Hematology Recent Labs Lab 07/02/16 1541  WBC 6.3  RBC 4.87  HGB 16.1  HCT 46.2  MCV 94.9  MCH 33.1  MCHC 34.8  RDW 12.8  PLT 252    Cardiac Enzymes Recent Labs Lab 07/02/16 1851 07/02/16 2153 07/03/16 0402 07/03/16 0924  TROPONINI 0.07* 0.23* 0.31* 0.16*    Recent Labs Lab 07/02/16 1550 07/02/16 1852  TROPIPOC 0.00 0.04      Radiology    Dg Chest 2 View  Result Date: 07/02/2016 CLINICAL DATA:  Right-sided chest pain and emesis beginning this morning. EXAM: CHEST  2 VIEW COMPARISON:  07/21/2015 FINDINGS: Heart  size is normal. Mediastinal shadows are normal. The lungs are clear. No bronchial thickening. No infiltrate, mass, effusion or collapse. Pulmonary vascularity is normal. No bony abnormality. IMPRESSION: Normal chest Electronically Signed   By: Nelson Chimes M.D.   On: 07/02/2016 15:00   Nm Myocar Multi W/spect W/wall Motion / Ef  Result Date: 07/03/2016  There was no ST segment deviation noted during stress.  Defect 1: There is a large defect of moderate severity present in the basal inferoseptal, mid inferoseptal, mid inferior, apical septal, apical inferior and apex location.  Findings consistent with prior myocardial infarction with peri-infarct ischemia.  This is an intermediate risk study.  Nuclear stress EF: 52%.  TID 1.34  Large size, moderate intensity partially reversible inferior, inferoseptal apical inferior and apical perfusion defect (SDS 5) - suggestive of scar with mild peri-infarct ischemia. LVEF 52% with mild inferior hypokinesis. TID ratio is elevated at 1.34. This is an intermediate risk study. Clinical correlation is recommended.    Patient Profile     61 y.o. male with past medical  history of hypertension, hyperlipidemia, nonobstructive coronary disease, tobacco and alcohol abuse admitted with atypical chest pain and elevated troponin. Patient has had no further chest pain. Nuclear study shows possible inferior ischemia.  Assessment & Plan   1 chest pain-symptoms are atypical. However troponin increased to 0.31. Nuclear study shows possible inferior ischemia. Plan cardiac catheterization today for definitive evaluation. The risks and benefits including myocardial infarction, CVA and death discussed and he agrees to proceed. Continue aspirin and statin. He has baseline bradycardia. We'll not add beta blocker.  2 hypertension-blood pressure is controlled. Continue present medications.  3 hyperlipidemia-continue statin.  4 tobacco/alcohol abuse-patient previously counseled on discontinuing.   Signed, Kirk Ruths, MD  07/04/2016, 10:13 AM

## 2016-07-04 NOTE — Progress Notes (Signed)
Pre-cath (prep) R radial wrist clipped. Pt. Refused to have R/L groin clipped. Stated "If they can't go through here, they won't do it at all". Pt. Also refused to watch video.

## 2016-07-04 NOTE — Progress Notes (Signed)
PHARMACIST - PHYSICIAN COMMUNICATION  DR:   Trilby Drummer  CONCERNING: IV to Oral Route Change Policy  RECOMMENDATION: This patient is receiving thiamine by the intravenous route.  Based on criteria approved by the Pharmacy and Therapeutics Committee, the intravenous medication(s) is/are being converted to the equivalent oral dose form(s).   DESCRIPTION: These criteria include:  The patient is eating (either orally or via tube) and/or has been taking other orally administered medications for a least 24 hours  The patient has no evidence of active gastrointestinal bleeding or impaired GI absorption (gastrectomy, short bowel, patient on TNA or NPO).  If you have questions about this conversion, please contact the Pharmacy Department  [x]   [phone] )  Zacarias Pontes  Stark Klein, PharmD Clinical Pharmacy Resident [phone] (Pager) 07/04/2016 1:14 PM

## 2016-07-04 NOTE — Progress Notes (Signed)
Progress Note  Patient Name: Andres Boyd Date of Encounter: 07/04/2016  Primary Cardiologist: Dr Meda Coffee  Subjective   No chest pain or dyspnea  Inpatient Medications    Scheduled Meds: . aspirin EC  81 mg Oral Daily  . atorvastatin  40 mg Oral q1800  . enoxaparin (LOVENOX) injection  40 mg Subcutaneous Q24H  . folic acid  1 mg Oral Daily  . isosorbide mononitrate  15 mg Oral Daily  . lisinopril  5 mg Oral Daily  . multivitamin with minerals  1 tablet Oral Daily  . nicotine  14 mg Transdermal Daily  . sodium chloride flush  3 mL Intravenous Q12H  . thiamine  100 mg Oral Daily   Or  . thiamine  100 mg Intravenous Daily   Continuous Infusions: . sodium chloride 100 mL/hr at 07/04/16 0843  . sodium chloride    . [START ON 07/05/2016] sodium chloride     Followed by  . [START ON 07/05/2016] sodium chloride     PRN Meds: sodium chloride, acetaminophen, gi cocktail, LORazepam **OR** LORazepam, morphine injection, nitroGLYCERIN, ondansetron (ZOFRAN) IV, sodium chloride flush   Vital Signs    Vitals:   07/03/16 1356 07/03/16 1359 07/03/16 1920 07/04/16 0510  BP: 114/74  107/60 92/62  Pulse: 82 79 (!) 59 (!) 58  Resp:   18 16  Temp:   97.9 F (36.6 C) 97.6 F (36.4 C)  TempSrc:   Oral Oral  SpO2:   98% 100%  Weight:    81.5 kg (179 lb 9.6 oz)  Height:        Intake/Output Summary (Last 24 hours) at 07/04/16 1013 Last data filed at 07/04/16 0845  Gross per 24 hour  Intake             1740 ml  Output             1670 ml  Net               70 ml   Filed Weights   07/02/16 2113 07/03/16 0322 07/04/16 0510  Weight: 81.1 kg (178 lb 14.4 oz) 80.8 kg (178 lb 3.2 oz) 81.5 kg (179 lb 9.6 oz)    Telemetry    Sinus- Personally Reviewed   Physical Exam   GEN: No acute distress.   Neck: No JVD Cardiac: RRR, no murmurs, rubs, or gallops.  Respiratory: Clear to auscultation bilaterally. GI: Soft, nontender, non-distended  MS: No edema Neuro:  Nonfocal  Psych: Normal  affect   Labs    Chemistry Recent Labs Lab 07/02/16 1541 07/04/16 0330  NA 134* 136  K 4.6 3.8  CL 102 105  CO2 22 24  GLUCOSE 112* 93  BUN 13 12  CREATININE 1.04 1.07  CALCIUM 9.7 8.7*  GFRNONAA >60 >60  GFRAA >60 >60  ANIONGAP 10 7     Hematology Recent Labs Lab 07/02/16 1541  WBC 6.3  RBC 4.87  HGB 16.1  HCT 46.2  MCV 94.9  MCH 33.1  MCHC 34.8  RDW 12.8  PLT 252    Cardiac Enzymes Recent Labs Lab 07/02/16 1851 07/02/16 2153 07/03/16 0402 07/03/16 0924  TROPONINI 0.07* 0.23* 0.31* 0.16*    Recent Labs Lab 07/02/16 1550 07/02/16 1852  TROPIPOC 0.00 0.04      Radiology    Dg Chest 2 View  Result Date: 07/02/2016 CLINICAL DATA:  Right-sided chest pain and emesis beginning this morning. EXAM: CHEST  2 VIEW COMPARISON:  07/21/2015 FINDINGS: Heart  size is normal. Mediastinal shadows are normal. The lungs are clear. No bronchial thickening. No infiltrate, mass, effusion or collapse. Pulmonary vascularity is normal. No bony abnormality. IMPRESSION: Normal chest Electronically Signed   By: Nelson Chimes M.D.   On: 07/02/2016 15:00   Nm Myocar Multi W/spect W/wall Motion / Ef  Result Date: 07/03/2016  There was no ST segment deviation noted during stress.  Defect 1: There is a large defect of moderate severity present in the basal inferoseptal, mid inferoseptal, mid inferior, apical septal, apical inferior and apex location.  Findings consistent with prior myocardial infarction with peri-infarct ischemia.  This is an intermediate risk study.  Nuclear stress EF: 52%.  TID 1.34  Large size, moderate intensity partially reversible inferior, inferoseptal apical inferior and apical perfusion defect (SDS 5) - suggestive of scar with mild peri-infarct ischemia. LVEF 52% with mild inferior hypokinesis. TID ratio is elevated at 1.34. This is an intermediate risk study. Clinical correlation is recommended.    Patient Profile     61 y.o. male with past medical  history of hypertension, hyperlipidemia, nonobstructive coronary disease, tobacco and alcohol abuse admitted with atypical chest pain and elevated troponin. Patient has had no further chest pain. Nuclear study shows possible inferior ischemia.  Assessment & Plan   1 chest pain-symptoms are atypical. However troponin increased to 0.31. Nuclear study shows possible inferior ischemia. Plan cardiac catheterization today for definitive evaluation. The risks and benefits including myocardial infarction, CVA and death discussed and he agrees to proceed. Continue aspirin and statin. He has baseline bradycardia. We'll not add beta blocker.  2 hypertension-blood pressure is controlled. Continue present medications.  3 hyperlipidemia-continue statin.  4 tobacco/alcohol abuse-patient previously counseled on discontinuing.   Signed, Kirk Ruths, MD  07/04/2016, 10:13 AM

## 2016-07-13 ENCOUNTER — Ambulatory Visit: Payer: Self-pay

## 2016-07-17 ENCOUNTER — Encounter: Payer: Self-pay | Admitting: Cardiology

## 2016-07-17 ENCOUNTER — Ambulatory Visit (INDEPENDENT_AMBULATORY_CARE_PROVIDER_SITE_OTHER): Payer: Self-pay | Admitting: Cardiology

## 2016-07-17 VITALS — BP 118/76 | HR 68 | Ht 73.5 in | Wt 182.0 lb

## 2016-07-17 DIAGNOSIS — I2510 Atherosclerotic heart disease of native coronary artery without angina pectoris: Secondary | ICD-10-CM

## 2016-07-17 NOTE — Progress Notes (Signed)
07/17/2016 Andres Boyd   Aug 15, 1955  [id number]  Primary Physician Patient, No Pcp Per Primary Cardiologist: Dr. Meda Coffee   Reason for Visit/CC: [facility] f/u for Chest Pain/CAD  HPI:  Andres Boyd is a 61 y.o. male with a h/o tobacco use, HTN and HLD, presenting to clinic today for post hospital f/u. Of note, he was previously admitted in 2017 and had a negative cath. He was readmitted to [facility] 07/02/16 for chest pain. Cardiac enzymes were +, with troponin peaking at 0.31. LDL was controlled with statin at 54 mg/dL. Hgb A1c 5.6. He first underwent a NST, as it was felt that his CP was atypical. This showed a large size, moderate intensity partially reversible inferior, inferoseptal apical inferior and apical perfusion defect (SDS 5) - suggestive of scar with mild peri-infarct ischemia. LVEF 52% with mild inferior hypokinesis. It was an intermediate risk study. Subsequently, he was referred for [facility]. This was performed by Dr. Irish Lack on 07/04/16 via the right radial artery. He was found to have mild nonobstructive CAD with 25% mid RCA, 25% RI, 50% ostial 2nd diag, 255 prox LCx. EF normal at 50-55%. LVEDP was normal. Continued medical therapy was recommended. He was discharged home on ASA, statin, nitrate and ACE-I. No BB given low resting HR.   He presents to clinic today with his son. He has done well. He is tolerating Imdur ok w/o side effects. He denies any recurrent CP. No dyspnea. No post cath complications. Radial sight is stable. BP is well controlled at 118/76. He continues to smoke but wants to try OTC nicotine patches to aid in smoking cessation.     Current Meds  Medication Sig  . aspirin 325 MG tablet Take 325 mg by mouth daily as needed for headache (pain).  Marland Kitchen atorvastatin (LIPITOR) 40 MG tablet Take 1 tablet (40 mg total) by mouth daily at 6 PM.  . isosorbide mononitrate (IMDUR) 30 MG 24 hr tablet Take 0.5 tablets (15 mg total) by mouth daily.  Marland Kitchen lisinopril (PRINIVIL,ZESTRIL) 5 MG  tablet Take 1 tablet (5 mg total) by mouth daily.  . Multiple Vitamin (MULTIVITAMIN WITH MINERALS) TABS tablet Take 1 tablet by mouth daily.   No Known Allergies Past Medical History:  Diagnosis Date  . 1st degree AV block   . Coronary artery disease   . ETOH abuse   . Hyperlipidemia   . Hypertension   . Myocardial infarct ([place]) 07/2015  . Tobacco abuse    Family History  Problem Relation Age of Onset  . Diabetes Mother   . Hypertension Mother   . Diabetes Father    Past Surgical History:  Procedure Laterality Date  . CARDIAC CATHETERIZATION N/A 07/19/2015   Procedure: Left Heart Cath and Coronary Angiography;  Surgeon: Jettie Booze, MD;  Location: [place] [facility];  Service: Cardiovascular;  Laterality: N/A;  . LEFT HEART CATH AND CORONARY ANGIOGRAPHY N/A 07/04/2016   Procedure: Left Heart Cath and Coronary Angiography;  Surgeon: Jettie Booze, MD;  Location: [place] [facility];  Service: Cardiovascular;  Laterality: N/A;   Social History   Social History  . Marital status: Single    Spouse name: N/A  . Number of children: N/A  . Years of education: N/A   Occupational History  . Not on file.   Social History Main Topics  . Smoking status: Current Every Day Smoker    Packs/day: 1.00    Years: 36.00    Types: Cigarettes  . Smokeless tobacco:  Never Used  . Alcohol use 1.2 - 1.8 oz/week    2 - 3 Cans of beer per week     Comment: 2-3 32oz cans daily  . Drug use: Yes    Types: Marijuana  . Sexual activity: Not on file   Other Topics Concern  . Not on file   Social History Narrative   Patient unemployed. Lives in [place] with son.      Review of Systems: General: negative for chills, fever, night sweats or weight changes.  Cardiovascular: negative for chest pain, dyspnea on exertion, edema, orthopnea, palpitations, paroxysmal nocturnal dyspnea or shortness of breath Dermatological: negative for rash Respiratory: negative for cough or  wheezing Urologic: negative for hematuria Abdominal: negative for nausea, vomiting, diarrhea, bright red blood per rectum, melena, or hematemesis Neurologic: negative for visual changes, syncope, or dizziness All other systems reviewed and are otherwise negative except as noted above.   Physical Exam:  Blood pressure 118/76, pulse 68, height 6' 1.5" (1.867 m), weight 182 lb (82.6 kg).  General appearance: alert, cooperative and no distress Neck: no carotid bruit and no JVD Lungs: clear to auscultation bilaterally Heart: regular rate and rhythm, S1, S2 normal, no murmur, click, rub or gallop Extremities: extremities normal, atraumatic, no cyanosis or edema Pulses: 2+ and symmetric Skin: Skin color, texture, turgor normal. No rashes or lesions Neurologic: Grossly normal  EKG not performed  -- personally reviewed   [facility]HC 07/04/16 Procedures   Left Heart Cath and Coronary Angiography  Conclusion     Mid RCA lesion, 25 %stenosed.  Ramus lesion, 25 %stenosed.  Ost 2nd Diag to 2nd Diag lesion, 50 %stenosed.  Prox Cx lesion, 25 %stenosed.  The left ventricular ejection fraction is 50-55% by visual estimate.  The left ventricular systolic function is normal.  LV end diastolic pressure is normal.  Compared to prior ventriculogram in 2017, the base now contracts well and there is some anterolateral hypokinesis.   Continue medical therapy for BP control and treatment for cardiomyopathy.      ASSESSMENT AND PLAN:   1. Nonobstructive CAD: LHC findings 07/04/16 noted above. EF 50-55%. Medical therapy recommended. Imdur added. Tolerating well w/o side effects. No recurrent CP. BP stable. Continue Imdur, ASA, statin and ACE-I. No BB given low resting HR/ h/o bradycardia.   2. HTN: controlled on current regimen. Tolerating addition of Imdur for CAD.   3. HLD: controlled with Lipitor. Recent Lipid panel showed LDL to be at goal at 54 mg/dL. Continue statin. Repeat levels in 6-12 months.    4. Tobacco Abuse: smoking cessation encouraged. He wants to try OTC nicotine patches. He was encouraged to try this.    Follow-Up w/ Dr. Meda Coffee in 6 months.   Finleigh Cheong Ladoris Gene, M[facility] 07/17/2016 9:40 AM

## 2016-07-17 NOTE — Patient Instructions (Signed)
Medication Instructions:   MAKE SURE YOUR TAKE ASPIRIN 35 MG ONCE A DAY   If you need a refill on your cardiac medications before your next appointment, please call your pharmacy.  Labwork: NONE ORDERED  TODAY    Testing/Procedures: NONE ORDERED  TODAY    Follow-Up: Your physician wants you to follow-up in:  IN 6  MONTHS WITH DR Johann Capers will receive a reminder letter in the mail two months in advance. If you don't receive a letter, please call our office to schedule the follow-up appointment.      Any Other Special Instructions Will Be Listed Below (If Applicable).

## 2016-09-05 ENCOUNTER — Emergency Department (HOSPITAL_COMMUNITY): Payer: Medicaid Other

## 2016-09-05 ENCOUNTER — Encounter (HOSPITAL_COMMUNITY): Payer: Self-pay

## 2016-09-05 ENCOUNTER — Emergency Department (HOSPITAL_COMMUNITY)
Admission: EM | Admit: 2016-09-05 | Discharge: 2016-09-05 | Disposition: A | Discharge status: Home / Self Care | Payer: Medicaid Other | Source: Home / Self Care | Attending: Emergency Medicine | Admitting: Emergency Medicine

## 2016-09-05 ENCOUNTER — Inpatient Hospital Stay (HOSPITAL_COMMUNITY)
Admission: EM | Admit: 2016-09-05 | Discharge: 2016-09-10 | DRG: 286 | Disposition: A | Discharge status: Home / Self Care | Payer: Medicaid Other | Attending: Internal Medicine | Admitting: Internal Medicine

## 2016-09-05 DIAGNOSIS — I2510 Atherosclerotic heart disease of native coronary artery without angina pectoris: Secondary | ICD-10-CM

## 2016-09-05 DIAGNOSIS — R823 Hemoglobinuria: Secondary | ICD-10-CM | POA: Diagnosis present

## 2016-09-05 DIAGNOSIS — F17200 Nicotine dependence, unspecified, uncomplicated: Secondary | ICD-10-CM | POA: Diagnosis present

## 2016-09-05 DIAGNOSIS — I5023 Acute on chronic systolic (congestive) heart failure: Secondary | ICD-10-CM | POA: Diagnosis present

## 2016-09-05 DIAGNOSIS — I25110 Atherosclerotic heart disease of native coronary artery with unstable angina pectoris: Secondary | ICD-10-CM | POA: Diagnosis present

## 2016-09-05 DIAGNOSIS — R0789 Other chest pain: Secondary | ICD-10-CM | POA: Diagnosis not present

## 2016-09-05 DIAGNOSIS — E871 Hypo-osmolality and hyponatremia: Secondary | ICD-10-CM | POA: Diagnosis present

## 2016-09-05 DIAGNOSIS — I110 Hypertensive heart disease with heart failure: Secondary | ICD-10-CM | POA: Diagnosis present

## 2016-09-05 DIAGNOSIS — F1010 Alcohol abuse, uncomplicated: Secondary | ICD-10-CM | POA: Diagnosis present

## 2016-09-05 DIAGNOSIS — R079 Chest pain, unspecified: Secondary | ICD-10-CM | POA: Diagnosis present

## 2016-09-05 DIAGNOSIS — Z716 Tobacco abuse counseling: Secondary | ICD-10-CM

## 2016-09-05 DIAGNOSIS — I214 Non-ST elevation (NSTEMI) myocardial infarction: Secondary | ICD-10-CM

## 2016-09-05 DIAGNOSIS — K2970 Gastritis, unspecified, without bleeding: Secondary | ICD-10-CM | POA: Diagnosis present

## 2016-09-05 DIAGNOSIS — F1290 Cannabis use, unspecified, uncomplicated: Secondary | ICD-10-CM | POA: Diagnosis present

## 2016-09-05 DIAGNOSIS — I5021 Acute systolic (congestive) heart failure: Secondary | ICD-10-CM

## 2016-09-05 DIAGNOSIS — B9789 Other viral agents as the cause of diseases classified elsewhere: Secondary | ICD-10-CM | POA: Diagnosis present

## 2016-09-05 DIAGNOSIS — R7989 Other specified abnormal findings of blood chemistry: Secondary | ICD-10-CM

## 2016-09-05 DIAGNOSIS — F17210 Nicotine dependence, cigarettes, uncomplicated: Secondary | ICD-10-CM | POA: Diagnosis present

## 2016-09-05 DIAGNOSIS — R748 Abnormal levels of other serum enzymes: Secondary | ICD-10-CM

## 2016-09-05 DIAGNOSIS — E785 Hyperlipidemia, unspecified: Secondary | ICD-10-CM | POA: Diagnosis present

## 2016-09-05 DIAGNOSIS — Z560 Unemployment, unspecified: Secondary | ICD-10-CM

## 2016-09-05 DIAGNOSIS — I252 Old myocardial infarction: Secondary | ICD-10-CM

## 2016-09-05 DIAGNOSIS — F1011 Alcohol abuse, in remission: Secondary | ICD-10-CM | POA: Diagnosis present

## 2016-09-05 DIAGNOSIS — R9431 Abnormal electrocardiogram [ECG] [EKG]: Secondary | ICD-10-CM

## 2016-09-05 DIAGNOSIS — R109 Unspecified abdominal pain: Secondary | ICD-10-CM

## 2016-09-05 DIAGNOSIS — R160 Hepatomegaly, not elsewhere classified: Secondary | ICD-10-CM | POA: Diagnosis present

## 2016-09-05 DIAGNOSIS — N179 Acute kidney failure, unspecified: Secondary | ICD-10-CM | POA: Diagnosis present

## 2016-09-05 DIAGNOSIS — I43 Cardiomyopathy in diseases classified elsewhere: Secondary | ICD-10-CM | POA: Diagnosis present

## 2016-09-05 DIAGNOSIS — I959 Hypotension, unspecified: Secondary | ICD-10-CM | POA: Diagnosis present

## 2016-09-05 DIAGNOSIS — T502X5A Adverse effect of carbonic-anhydrase inhibitors, benzothiadiazides and other diuretics, initial encounter: Secondary | ICD-10-CM | POA: Diagnosis not present

## 2016-09-05 DIAGNOSIS — Z8249 Family history of ischemic heart disease and other diseases of the circulatory system: Secondary | ICD-10-CM

## 2016-09-05 DIAGNOSIS — Z79899 Other long term (current) drug therapy: Secondary | ICD-10-CM

## 2016-09-05 DIAGNOSIS — I400 Infective myocarditis: Principal | ICD-10-CM | POA: Diagnosis present

## 2016-09-05 DIAGNOSIS — R778 Other specified abnormalities of plasma proteins: Secondary | ICD-10-CM

## 2016-09-05 DIAGNOSIS — I10 Essential (primary) hypertension: Secondary | ICD-10-CM | POA: Diagnosis not present

## 2016-09-05 LAB — CBC
HCT: 43.2 % (ref 39.0–52.0)
HCT: 43.9 % (ref 39.0–52.0)
HEMOGLOBIN: 15 g/dL (ref 13.0–17.0)
HEMOGLOBIN: 15.5 g/dL (ref 13.0–17.0)
MCH: 33 pg (ref 26.0–34.0)
MCH: 33.4 pg (ref 26.0–34.0)
MCHC: 34.7 g/dL (ref 30.0–36.0)
MCHC: 35.3 g/dL (ref 30.0–36.0)
MCV: 95.2 fL (ref 78.0–100.0)
MCV: 94.6 fL (ref 78.0–100.0)
PLATELETS: 234 10*3/uL (ref 150–400)
Platelets: 259 10*3/uL (ref 150–400)
RBC: 4.54 MIL/uL (ref 4.22–5.81)
RBC: 4.64 MIL/uL (ref 4.22–5.81)
RDW: 12.7 % (ref 11.5–15.5)
RDW: 12.6 % (ref 11.5–15.5)
WBC: 5.1 10*3/uL (ref 4.0–10.5)
WBC: 6.4 10*3/uL (ref 4.0–10.5)

## 2016-09-05 LAB — URINALYSIS, ROUTINE W REFLEX MICROSCOPIC
BILIRUBIN URINE: NEGATIVE
Bacteria, UA: NONE SEEN
GLUCOSE, UA: NEGATIVE mg/dL
KETONES UR: 20 mg/dL — AB
Leukocytes, UA: NEGATIVE
Nitrite: NEGATIVE
PROTEIN: 100 mg/dL — AB
Specific Gravity, Urine: 1.026 (ref 1.005–1.030)
pH: 5 (ref 5.0–8.0)

## 2016-09-05 LAB — I-STAT TROPONIN, ED
Troponin i, poc: 0.31 ng/mL (ref 0.00–0.08)
Troponin i, poc: 0.97 ng/mL (ref 0.00–0.08)

## 2016-09-05 LAB — COMPREHENSIVE METABOLIC PANEL
ALBUMIN: 4.1 g/dL (ref 3.5–5.0)
ALK PHOS: 55 U/L (ref 38–126)
ALT: 80 U/L — AB (ref 17–63)
ALT: 76 U/L — ABNORMAL HIGH (ref 17–63)
ANION GAP: 12 (ref 5–15)
ANION GAP: 13 (ref 5–15)
AST: 64 U/L — ABNORMAL HIGH (ref 15–41)
AST: 55 U/L — ABNORMAL HIGH (ref 15–41)
Albumin: 4.4 g/dL (ref 3.5–5.0)
Alkaline Phosphatase: 55 U/L (ref 38–126)
BUN: 15 mg/dL (ref 6–20)
BUN: 8 mg/dL (ref 6–20)
CALCIUM: 9.4 mg/dL (ref 8.9–10.3)
CHLORIDE: 104 mmol/L (ref 101–111)
CHLORIDE: 100 mmol/L — AB (ref 101–111)
CO2: 18 mmol/L — AB (ref 22–32)
CO2: 21 mmol/L — AB (ref 22–32)
CREATININE: 1.22 mg/dL (ref 0.61–1.24)
Calcium: 9.7 mg/dL (ref 8.9–10.3)
Creatinine, Ser: 1.09 mg/dL (ref 0.61–1.24)
GFR calc Af Amer: >60 mL/min (ref >60)
GFR calc Af Amer: >60 mL/min (ref >60)
GFR calc non Af Amer: >60 mL/min (ref >60)
GFR calc non Af Amer: >60 mL/min (ref >60)
GLUCOSE: 123 mg/dL — AB (ref 65–99)
Glucose, Bld: 133 mg/dL — ABNORMAL HIGH (ref 65–99)
Potassium: 4 mmol/L (ref 3.5–5.1)
Potassium: 4.3 mmol/L (ref 3.5–5.1)
SODIUM: 134 mmol/L — AB (ref 135–145)
SODIUM: 134 mmol/L — AB (ref 135–145)
Total Bilirubin: 0.8 mg/dL (ref 0.3–1.2)
Total Bilirubin: 0.7 mg/dL (ref 0.3–1.2)
Total Protein: 8.1 g/dL (ref 6.5–8.1)
Total Protein: 9.1 g/dL — ABNORMAL HIGH (ref 6.5–8.1)

## 2016-09-05 LAB — LIPASE, BLOOD
LIPASE: 22 U/L (ref 11–51)
Lipase: 26 U/L (ref 11–51)

## 2016-09-05 LAB — TROPONIN I: Troponin I: <0.03 ng/mL (ref <0.03)

## 2016-09-05 MED ORDER — NITROGLYCERIN IN D5W 200-5 MCG/ML-% IV SOLN: 0.0000 ug/min | INTRAVENOUS | Status: DC

## 2016-09-05 MED ORDER — NITROGLYCERIN 0.4 MG SL SUBL
0.4000 mg | SUBLINGUAL_TABLET | SUBLINGUAL | Status: DC | PRN
  Administered 2016-09-05: 0.4 mg via SUBLINGUAL
  Filled 2016-09-05: qty 1

## 2016-09-05 MED ORDER — HEPARIN (PORCINE) IN NACL 100-0.45 UNIT/ML-% IJ SOLN
1100.0000 [IU]/h | INTRAMUSCULAR | Status: DC
  Administered 2016-09-05: 1100 [IU]/h via INTRAVENOUS
  Filled 2016-09-05: qty 250

## 2016-09-05 MED ORDER — LORAZEPAM 2 MG/ML IJ SOLN
0.5000 mg | Freq: Once | INTRAMUSCULAR | Status: AC
  Administered 2016-09-05: 0.5 mg via INTRAVENOUS
  Filled 2016-09-05: qty 1

## 2016-09-05 MED ORDER — LORAZEPAM 2 MG/ML IJ SOLN: 0.5000 mg | Freq: Once | INTRAMUSCULAR | Status: DC

## 2016-09-05 MED ORDER — HEPARIN BOLUS VIA INFUSION
4000.0000 [IU] | Freq: Once | INTRAVENOUS | Status: AC
  Administered 2016-09-05: 4000 [IU] via INTRAVENOUS
  Filled 2016-09-05: qty 4000

## 2016-09-05 MED ORDER — NITROGLYCERIN IN D5W 200-5 MCG/ML-% IV SOLN
0.0000 ug/min | INTRAVENOUS | Status: DC
  Administered 2016-09-05: 5 ug/min via INTRAVENOUS
  Administered 2016-09-06: 35 ug/min via INTRAVENOUS
  Filled 2016-09-05 (×2): qty 250

## 2016-09-05 MED ORDER — LISINOPRIL 10 MG PO TABS
5.0000 mg | ORAL_TABLET | Freq: Once | ORAL | Status: AC
  Administered 2016-09-05: 5 mg via ORAL
  Filled 2016-09-05: qty 1

## 2016-09-05 MED ORDER — ISOSORBIDE MONONITRATE ER 30 MG PO TB24
15.0000 mg | ORAL_TABLET | Freq: Once | ORAL | Status: AC
  Administered 2016-09-05: 15 mg via ORAL
  Filled 2016-09-05: qty 1

## 2016-09-05 MED ORDER — ONDANSETRON HCL 4 MG/2ML IJ SOLN
4.0000 mg | Freq: Once | INTRAMUSCULAR | Status: AC
  Administered 2016-09-05: 4 mg via INTRAVENOUS
  Filled 2016-09-05: qty 2

## 2016-09-05 NOTE — Discharge Instructions (Signed)
Your workup is reassuring and there is no evidence of an acute cardiac issue. Your EKG, labs and chest x-ray showed no signs of heart attack. Continue your home medications as prescribed, if chest pain returns or new or concerning symptoms develop return to the ED. [facility] with [facility] or your primary doctor, have your liver enzymes and blood pressure rechecked.

## 2016-09-05 NOTE — ED Triage Notes (Signed)
[facility]- pt coming from home c/o chest pain throbbing in nature that woke him up this morning at 0600. Hx of MI. Pt alert and oriented. Had 324 of aspirin and 3 nitro PTA. Pt began vomiting with EMS. Skinw arm and dry. Pain 5/10 on arrival.

## 2016-09-05 NOTE — ED Notes (Signed)
ED Provider at bedside. 

## 2016-09-05 NOTE — Consult Note (Signed)
Cardiology Consultation:   Patient ID: Andres Boyd; [id number]; May 14, 1955   Admit date: 09/05/2016 Date of Consult: 09/05/2016  Primary Care Provider: Patient, No Pcp Per Primary Cardiologist: Dr. Meda Coffee Primary Electrophysiologist:  none   Patient Profile:   Andres Boyd is a 61 y.o. male with a hx of nonobstructive CAD by cath 07/2016  who is being seen today for the evaluation of chest pain and elevated troponin at the request of Dr. Alvino Chapel.  History of Present Illness:   Andres Boyd is a 61yo AAM with a history of hyperlipidemia, HTN, ETOH and tobacco abuse, QT prolongation and NSTEMI in 07/2015 at which time his trop peaked at 2.  Cath at that time showed mild plaque and EF was 40-45% and it was felt that he had coronary vasospasm or Takotsubo variant and medical management was recommended.  Repeat echo 04/27/2016 showed improvement in LVF with EF 50-55% with inferior HK.   He represented to ER with sharp stabbing CP along with N/V/diaphoresis and abdominal pain on 07/03/2016.  Pain did not improve with SL NTG but did improve with Dilaudid.  He was drinking up to 6-8 beers and 2 glasses of wine nightly and thought it was GERD.  Trop peaked at 0.31 and UDS was + for marijuana and opioids.  It was felt that his CP was likely related to excessive ETOH intake but nuclear stress test showed possible inferior ischemia and underwent cath showing 25% mid RCA, 25% prox LCx, 25% pro ramus and 50% D1.  Medical Rx was recommended.    Today he awakened at 6am with severe chest pressure in midsternal area with radiation to the right and left chest as well as around his right side into his back and into his abdomen. There was no associated SOB.  He became diaphoretic with N/V and came to the ER.  His pain was felt to be atypical, unrelieved with SL NTG x 3 and Trop was negative and he was discharged home. His pain continued throughout the day and worsened with continued N/V and he returned to the ER.  His  trop was noted to be elevated at 0.31.  Cardiology is now asked to consult.   Past Medical History:  Diagnosis Date  . 1st degree AV block   . Coronary artery disease   . ETOH abuse   . Hyperlipidemia   . Hypertension   . Myocardial infarct ([place]) 07/2015  . Tobacco abuse     Past Surgical History:  Procedure Laterality Date  . CARDIAC CATHETERIZATION N/A 07/19/2015   Procedure: Left Heart Cath and Coronary Angiography;  Surgeon: Jettie Booze, MD;  Location: [place] [facility];  Service: Cardiovascular;  Laterality: N/A;  . LEFT HEART CATH AND CORONARY ANGIOGRAPHY N/A 07/04/2016   Procedure: Left Heart Cath and Coronary Angiography;  Surgeon: Jettie Booze, MD;  Location: [place] [facility];  Service: Cardiovascular;  Laterality: N/A;     Home Medications:  Prior to Admission medications   Medication Sig Start Date End Date Taking? Authorizing Provider  atorvastatin (LIPITOR) 40 MG tablet Take 1 tablet (40 mg total) by mouth daily at 6 PM. 08/26/15  Yes Imogene Burn, PA-C  isosorbide mononitrate (IMDUR) 30 MG 24 hr tablet Take 0.5 tablets (15 mg total) by mouth daily. 02/10/16  Yes Dorothy Spark, MD  lisinopril (PRINIVIL,ZESTRIL) 5 MG tablet Take 1 tablet (5 mg total) by mouth daily. 08/26/15  Yes Imogene Burn, PA-C  Multiple Vitamin (MULTIVITAMIN WITH  MINERALS) TABS tablet Take 1 tablet by mouth daily.   Yes [provider]    Inpatient Medications: Scheduled Meds:  Continuous Infusions:  PRN Meds: nitroGLYCERIN  Allergies:   No Known Allergies  Social History:   Social History   Social History  . Marital status: Single    Spouse name: N/A  . Number of children: N/A  . Years of education: N/A   Occupational History  . Not on file.   Social History Main Topics  . Smoking status: Current Every Day Smoker    Packs/day: 1.00    Years: 36.00    Types: Cigarettes  . Smokeless tobacco: Never Used  . Alcohol use 1.2 - 1.8 oz/week    2 -  3 Cans of beer per week     Comment: 2-3 32oz cans daily  . Drug use: Yes    Types: Marijuana  . Sexual activity: Not on file   Other Topics Concern  . Not on file   Social History Narrative   Patient unemployed. Lives in [place] with son.     Family History:    Family History  Problem Relation Age of Onset  . Diabetes Mother   . Hypertension Mother   . Diabetes Father      ROS:  Please see the history of present illness.  ROS  All other ROS reviewed and negative.     Physical Exam/Data:   Vitals:   09/05/16 2030 09/05/16 2115 09/05/16 2130 09/05/16 2145  BP: (!) 167/97 (!) 155/94 (!) 176/97 (!) 152/92  Pulse: 67 65 78 80  Resp: 15 10 14 15   Temp:      TempSrc:      SpO2: 100% 98% 97% 100%   No intake or output data in the 24 hours ending 09/05/16 2214 There were no vitals filed for this visit. There is no height or weight on file to calculate BMI.  General:  Well nourished, well developed, in moderate distress due to N/V and CP HEENT: normal Lymph: no adenopathy Neck: no JVD Endocrine:  No thryomegaly Vascular: No carotid bruits; FA pulses 2+ bilaterally without bruits  Cardiac:  normal S1, S2; RRR; no murmur  Lungs:  clear to auscultation bilaterally, no wheezing, rhonchi or rales  Abd: soft, nontender, no hepatomegaly  Ext: no edema Musculoskeletal:  No deformities, BUE and BLE strength normal and equal Skin: warm and dry  Neuro:  CNs 2-12 intact, no focal abnormalities noted Psych:  Normal affect   EKG:  The EKG was personally reviewed and demonstrates:  NSR with biphasic ST/T wave changes in the anterior leads V3-V5 which is new from prior EKG with some peaking of the T waves. Telemetry:  Telemetry was personally reviewed and demonstrates:  NSR  Relevant CV Studies: Cardiac cath 07/2016 Conclusion     Mid RCA lesion, 25 %stenosed.  Ramus lesion, 25 %stenosed.  Ost 2nd Diag to 2nd Diag lesion, 50 %stenosed.  Prox Cx lesion, 25  %stenosed.  The left ventricular ejection fraction is 50-55% by visual estimate.  The left ventricular systolic function is normal.  LV end diastolic pressure is normal.  Compared to prior ventriculogram in 2017, the base now contracts well and there is some anterolateral hypokinesis.   Continue medical therapy for BP control and treatment for cardiomyopathy.      Laboratory Data:  Chemistry Recent Labs Lab 09/05/16 0842 09/05/16 1928  NA 134* 134*  K 4.0 4.3  CL 104 100*  CO2 18* 21*  GLUCOSE 123* 133*  BUN 15 8  CREATININE 1.22 1.09  CALCIUM 9.4 9.7  GFRNONAA >60 >60  GFRAA >60 >60  ANIONGAP 12 13     Recent Labs Lab 09/05/16 0842 09/05/16 1928  PROT 8.1 9.1*  ALBUMIN 4.1 4.4  AST 64* 55*  ALT 80* 76*  ALKPHOS 55 55  BILITOT 0.8 0.7   Hematology Recent Labs Lab 09/05/16 0842 09/05/16 1928  WBC 5.1 6.4  RBC 4.54 4.64  HGB 15.0 15.5  HCT 43.2 43.9  MCV 95.2 94.6  MCH 33.0 33.4  MCHC 34.7 35.3  RDW 12.7 12.6  PLT 234 259   Cardiac Enzymes Recent Labs Lab 09/05/16 0842  TROPONINI <0.03    Recent Labs Lab 09/05/16 1950  TROPIPOC 0.31*    BNPNo results for input(s): BNP, PROBNP in the last 168 hours.  DDimer No results for input(s): DDIMER in the last 168 hours.  Radiology/Studies:  Dg Chest 2 View  Result Date: 09/05/2016 CLINICAL DATA:  Right chest pain today with nausea and weakness. EXAM: CHEST  2 VIEW COMPARISON:  PA and lateral chest 07/02/2016 and 07/21/2015. FINDINGS: The chest is hyperexpanded with attenuation of the pulmonary vasculature. Lungs are clear. Heart size is normal. No pneumothorax or pleural fluid. No focal bony abnormality. IMPRESSION: No acute disease.  The lungs appear emphysematous. Electronically Signed   By: Inge Rise M.D.   On: 09/05/2016 09:03    Assessment and Plan:   1. Elevated troponin - this is the second time he has had a similar presentation in 2 months.  Cath in July showed mild nonobstructive  CAD and low normal LVF and trop at that time with similar elevation at a peak of 0.31.  Unclear whether this represents a true ACS or not.  - continue to cycle trop  2. Chest pain that is somewhat atypical but identical in presentation to his other 2 CP admissions (2017/ and 07/2016) that prompted cath with similar CP, abdominal pain, N./V, diaphoresis.  None of the episodes were improved with SL NTG and each cath showed only mild nonobstructive disease.  Nuclear stress test and echo showed inferior defects.  ? Whether this is truly cardiac pain.  He clearly looks uncomfortable and is vomiting while I was in the room.  He has a history of ETOH abuse in the past and there was some question as to whether this was related to ETOH with GERD although this would not explain the trop elevation.  He has no fever and no diarrhea so acute gastroenteritis is not likely.  He does still have his GB and his pain is more right sided with radiation around his back on the right side.  EKG dose show some subtle biphasic ST/T wave changes in V3-V5 that are new since prior EKGs. - will start IV Heparin and NTG gtt - continue ASA - cycle trop - case discussed with Dr. Irish Lack and if trop comes back further elevated and he continues to have CP on IV NTG gtt and IV Heparin then will take to cath lab tonight.  - check amylase, LFTs, lipase - consider abdominal US in am to rule out cholelithiasis if no further elevation in trop.  - continue statin.   - add PPI/Zofran for N/V  3.  HTN - BP elevated at 150/100 - 176/17mmHg but this is in the setting of N/V.   - continue ACE I  4.  ETOH abuse - will need DT prophylaxis per IM  - cycle  trop until they peak.     Signed, Fransico Him, MD  09/05/2016 10:14 PM

## 2016-09-05 NOTE — ED Provider Notes (Signed)
[facility] DEPT Provider Note   CSN: [id number] Arrival date & time: 09/05/16  1853     History   Chief Complaint Chief Complaint  Patient presents with  . Abdominal Pain  . Chest Pain   HPI  [name] is a 61 y.o. Male with PMH significant for HTN, NSTEMI in 2017 (possible Takotsubo vs coronary vasospasm), tobacco use, and HLD who presents with chest pain that started this morning at 6am. He states that the chest pain woke him up from sleep. It is intermittent, 7/10 in severity, and radiates to his stomach and back. He endorses associated nausea and vomiting. Denies shortness of breath, fevers, or chills. No exacerbating factors. Lying still improves the pain. He presented to the emergency room earlier this morning. Received aspirin and 3 NTG tablets which did not help with his pain. Pain was felt to be noncardiac and he was discharged. The pain persisted throughout the day, which prompted him to come back to the ER. He had a heart cath in July 2018 which showed stable CAD and recommended medical management. No family history of heart disease.  Smokes ~1ppd, and drinks 2-3 beers daily. Denies cocaine use, endorses marijuana use. Last used yesterday.  Past Medical History:  Diagnosis Date  . 1st degree AV block   . Coronary artery disease   . ETOH abuse   . Hyperlipidemia   . Hypertension   . Myocardial infarct ([place]) 07/2015  . Tobacco abuse     Patient Active Problem List   Diagnosis Date Noted  . Atypical chest pain   . Elevated troponin   . AV block, 1st degree 07/02/2016  . Chest pain 02/10/2016  . Claudication ([place]) 02/10/2016  . Hypotension 07/21/2015  . History of prolonged Q-T interval on ECG 07/21/2015  . ETOH abuse 07/21/2015  . Coronary artery disease 07/20/2015  . HLD (hyperlipidemia) 07/19/2015  . Hypertension 07/19/2015  . Tobacco use disorder 07/19/2015  . NSTEMI (non-ST elevated myocardial infarction) ([place]) 07/18/2015    Past Surgical History:    Procedure Laterality Date  . CARDIAC CATHETERIZATION N/A 07/19/2015   Procedure: Left Heart Cath and Coronary Angiography;  Surgeon: Jettie Booze, MD;  Location: [facility];  Service: Cardiovascular;  Laterality: N/A;  . LEFT HEART CATH AND CORONARY ANGIOGRAPHY N/A 07/04/2016   Procedure: Left Heart Cath and Coronary Angiography;  Surgeon: Jettie Booze, MD;  Location: [facility];  Service: Cardiovascular;  Laterality: N/A;       Home Medications    Prior to Admission medications   Medication Sig Start Date End Date Taking? Authorizing Provider  atorvastatin (LIPITOR) 40 MG tablet Take 1 tablet (40 mg total) by mouth daily at 6 PM. 08/26/15  Yes Imogene Burn, PA-C  isosorbide mononitrate (IMDUR) 30 MG 24 hr tablet Take 0.5 tablets (15 mg total) by mouth daily. 02/10/16  Yes Dorothy Spark, MD  lisinopril (PRINIVIL,ZESTRIL) 5 MG tablet Take 1 tablet (5 mg total) by mouth daily. 08/26/15  Yes Imogene Burn, PA-C  Multiple Vitamin (MULTIVITAMIN WITH MINERALS) TABS tablet Take 1 tablet by mouth daily.   Yes [provider]    Family History Family History  Problem Relation Age of Onset  . Diabetes Mother   . Hypertension Mother   . Diabetes Father     Social History Social History  Substance Use Topics  . Smoking status: Current Every Day Smoker    Packs/day: 1.00    Years: 36.00  Types: Cigarettes  . Smokeless tobacco: Never Used  . Alcohol use 1.2 - 1.8 oz/week    2 - 3 Cans of beer per week     Comment: 2-3 32oz cans daily     Allergies   Patient has no known allergies.   Review of Systems Review of Systems  Review of systems unremarkable except as stated above in HPI.  Physical Exam Updated Vital Signs BP (!) 163/92   Pulse 81   Temp 97.9 F (36.6 C) (Oral)   Resp 13   SpO2 98%   Physical Exam GEN: Lying in bed in NAD; alert and oriented HENT: Moist mucous membranes. No visible lesions. EYES: PERRL. Sclera  anicteric. Some conjunctival injection. RESP: Clear to auscultation bilaterally. No wheezes, rales, or rhonchi. CV: Normal rate and regular rhythm. No murmurs, gallops, or rubs. No LE edema. No tenderness to palpation. ABD: Soft. Mildly tender in all quadrants. Non-distended. Normoactive bowel sounds. EXT: No edema. Warm. 2+ radial and DP pulses. NEURO: Cranial nerves II-XII grossly intact. Able to lift all four extremities against gravity.  ED Treatments / Results  Labs (all labs ordered are listed, but only abnormal results are displayed) Labs Reviewed  COMPREHENSIVE METABOLIC PANEL - Abnormal; Notable for the following:       Result Value   Sodium 134 (*)    Chloride 100 (*)    CO2 21 (*)    Glucose, Bld 133 (*)    Total Protein 9.1 (*)    AST 55 (*)    ALT 76 (*)    All other components within normal limits  URINALYSIS, ROUTINE W REFLEX MICROSCOPIC - Abnormal; Notable for the following:    Hgb urine dipstick MODERATE (*)    Ketones, ur 20 (*)    Protein, ur 100 (*)    Squamous Epithelial / LPF 0-5 (*)    All other components within normal limits  I-STAT TROPONIN, ED - Abnormal; Notable for the following:    Troponin i, poc 0.31 (*)    All other components within normal limits  I-STAT TROPONIN, ED - Abnormal; Notable for the following:    Troponin i, poc 0.97 (*)    All other components within normal limits  LIPASE, BLOOD  CBC  RAPID URINE DRUG SCREEN, HOSP PERFORMED  AMYLASE  LIPASE, BLOOD  HEPATIC FUNCTION PANEL  TROPONIN I  TROPONIN I  TROPONIN I  HEPARIN LEVEL (UNFRACTIONATED)  CBC  I-STAT TROPONIN, ED    EKG  EKG Interpretation  Date/Time:  Tuesday September 05 2016 19:04:12 EDT Ventricular Rate:  65 PR Interval:  194 QRS Duration: 94 QT Interval:  450 QTC Calculation: 468 R Axis:   78 Text Interpretation:  Normal sinus rhythm Normal ECG Confirmed by Davonna Belling [phone]) on 09/05/2016 9:05:10 PM       Radiology Dg Chest 2 View  Result Date:  09/05/2016 CLINICAL DATA:  Right chest pain today with nausea and weakness. EXAM: CHEST  2 VIEW COMPARISON:  PA and lateral chest 07/02/2016 and 07/21/2015. FINDINGS: The chest is hyperexpanded with attenuation of the pulmonary vasculature. Lungs are clear. Heart size is normal. No pneumothorax or pleural fluid. No focal bony abnormality. IMPRESSION: No acute disease.  The lungs appear emphysematous. Electronically Signed   By: Inge Rise M.D.   On: 09/05/2016 09:03    Procedures Procedures (including critical care time)  Medications Ordered in ED Medications  nitroGLYCERIN 50 mg in dextrose 5 % 250 mL (0.2 mg/mL) infusion (20 mcg/min Intravenous  Rate/Dose Change 09/05/16 2315)  heparin ADULT infusion 100 units/mL (25000 units/223mL sodium chloride 0.45%) (1,100 Units/hr Intravenous New Bag/Given 09/05/16 2336)  isosorbide mononitrate (IMDUR) 24 hr tablet 15 mg (15 mg Oral Given 09/05/16 2211)  lisinopril (PRINIVIL,ZESTRIL) tablet 5 mg (5 mg Oral Given 09/05/16 2211)  heparin bolus via infusion 4,000 Units (4,000 Units Intravenous Bolus from Bag 09/05/16 2341)  ondansetron (ZOFRAN) injection 4 mg (4 mg Intravenous Given 09/05/16 2336)     Initial Impression / Assessment and Plan / ED Course  I have reviewed the triage vital signs and the nursing notes.  Pertinent labs & imaging results that were available during my care of the patient were reviewed by me and considered in my medical decision making (see chart for details).  Mr. Sorg is a 61yo male with PMH significant for HTN, NSTEMI in 2017 (possible Takotsubo vs coronary vasospasm), tobacco use, and HLD who presents with persistent chest pain since this morning at 6am. Presented this morning to the ER, troponin negative, given aspirin and 3 NTG, and pain was felt to be noncardiac in etiology. Re-presented for persistent chest and abdominal pain and N/V. Troponin now elevated to 0.31. EKG with NSR. Consider ACS vs dissection vs alcoholic gastritis.  UDS ordered.  States he did not take his BP medications this morning. BP elevated to 172/95. Will give home lisinopril and imdur. Cardiology consulted from ER.  10:15pm Patient started having profuse vomiting and worsened chest pain. Administered SL NTG. Cardiology evaluated. Follow up repeat troponin. Started heparin drip and NTG drip.  11:48pm Troponin 0.31 -> 0.97. Continues to have 2/10 chest pain on NTG drip. Cardiology not planning for cath tonight. Will admit to hospitalist service for further work-up.  Final Clinical Impressions(s) / ED Diagnoses   Final diagnoses:  Atypical chest pain  Elevated troponin I level    New Prescriptions New Prescriptions   No medications on file     Colbert Ewing, MD 09/06/16 Ronald Pippins, MD 09/06/16 [phone]

## 2016-09-05 NOTE — Progress Notes (Signed)
ANTICOAGULATION CONSULT NOTE - Initial Consult  Pharmacy Consult for heparin Indication: chest pain/ACS  No Known Allergies  Patient Measurements:   Heparin Dosing Weight: 81 kg  Vital Signs: Temp: 97.9 F (36.6 C) (09/04 1907) Temp Source: Oral (09/04 1907) BP: 167/97 (09/04 2200) Pulse Rate: 85 (09/04 2200)  Labs:  Recent Labs  09/05/16 0842 09/05/16 1928  HGB 15.0 15.5  HCT 43.2 43.9  PLT 234 259  CREATININE 1.22 1.09  TROPONINI <0.03  --     CrCl cannot be calculated (Unknown ideal weight.).   Medical History: Past Medical History:  Diagnosis Date  . 1st degree AV block   . Coronary artery disease   . ETOH abuse   . Hyperlipidemia   . Hypertension   . Myocardial infarct ([place]) 07/2015  . Tobacco abuse     Assessment: 61 yo male with history of prior NSTEMI and prior cath - managed conservatively. Now with similar chest pain and elevated troponin - ACS rule out. CBC wnl, SCr 1.   Goal of Therapy:  Heparin level 0.3-0.7 units/ml Monitor platelets by anticoagulation protocol: Yes   Plan:  -Heparin bolus 4000 units x1 then 1100 units/hr -Daily HL, CBC -First level at 0600   Harvel Quale 09/05/2016,10:42 PM

## 2016-09-05 NOTE — ED Triage Notes (Signed)
Pt returns after visit today for persistent chest pain and abd pain that started this morning. Pt's pain was relieved with nitro on prior visit. Pt states pain is now worse in his abdomen. Son reports pt was diaphoretic at home PTA.

## 2016-09-05 NOTE — ED Provider Notes (Signed)
[facility] DEPT Provider Note   CSN: [id number] Arrival date & time: 09/05/16  0830     History   Chief Complaint Chief Complaint  Patient presents with  . Chest Pain    HPI  [facility] Boyd is a 61 y.o. Male with a history of previous MI, presents complaining of chest pain that started at 6 AM this morning. Patient reports this chest pain woke him from sleep, he describes it as throbbing that comes and goes, denies sensation of chest pressure. Reports the pain does not radiate anywhere, received aspirin and 3 nitroglycerin tablets prior to arrival, he reports these did not help with the pain. Patient reports this pain is similar to previous chest pain episodes. Patient reports nausea, started vomiting with EMS. Denies shortness of breath, fever, chills, abdominal pain. Patient had heart cath in July 2018 which showed stable coronary artery disease. Patient is a poor historian, but reports he is compliant with his medications. Patient reports he smokes about one pack per day, as well as 2-3 cans of beer daily. Patient denies cocaine use, reports daily marijuana use.      Past Medical History:  Diagnosis Date  . 1st degree AV block   . Coronary artery disease   . ETOH abuse   . Hyperlipidemia   . Hypertension   . Myocardial infarct ([place]) 07/2015  . Tobacco abuse     Patient Active Problem List   Diagnosis Date Noted  . Elevated troponin   . AV block, 1st degree 07/02/2016  . Chest pain 02/10/2016  . Claudication ([place]) 02/10/2016  . Hypotension 07/21/2015  . History of prolonged Q-T interval on ECG 07/21/2015  . ETOH abuse 07/21/2015  . Coronary artery disease 07/20/2015  . HLD (hyperlipidemia) 07/19/2015  . Hypertension 07/19/2015  . Tobacco use disorder 07/19/2015  . NSTEMI (non-ST elevated myocardial infarction) ([place]) 07/18/2015    Past Surgical History:  Procedure Laterality Date  . CARDIAC CATHETERIZATION N/A 07/19/2015   Procedure: Left Heart Cath and Coronary  Angiography;  Surgeon: Jettie Booze, MD;  Location: [facility];  Service: Cardiovascular;  Laterality: N/A;  . LEFT HEART CATH AND CORONARY ANGIOGRAPHY N/A 07/04/2016   Procedure: Left Heart Cath and Coronary Angiography;  Surgeon: Jettie Booze, MD;  Location: [facility];  Service: Cardiovascular;  Laterality: N/A;       Home Medications    Prior to Admission medications   Medication Sig Start Date End Date Taking? Authorizing Provider  aspirin EC 81 MG tablet Take 81 mg by mouth daily.    [provider]  atorvastatin (LIPITOR) 40 MG tablet Take 1 tablet (40 mg total) by mouth daily at 6 PM. 08/26/15   Imogene Burn, PA-C  isosorbide mononitrate (IMDUR) 30 MG 24 hr tablet Take 0.5 tablets (15 mg total) by mouth daily. 02/10/16   Dorothy Spark, MD  lisinopril (PRINIVIL,ZESTRIL) 5 MG tablet Take 1 tablet (5 mg total) by mouth daily. 08/26/15   Imogene Burn, PA-C  Multiple Vitamin (MULTIVITAMIN WITH MINERALS) TABS tablet Take 1 tablet by mouth daily.    [provider]    Family History Family History  Problem Relation Age of Onset  . Diabetes Mother   . Hypertension Mother   . Diabetes Father     Social History Social History  Substance Use Topics  . Smoking status: Current Every Day Smoker    Packs/day: 1.00    Years: 36.00    Types: Cigarettes  .  Smokeless tobacco: Never Used  . Alcohol use 1.2 - 1.8 oz/week    2 - 3 Cans of beer per week     Comment: 2-3 32oz cans daily     Allergies   Patient has no known allergies.   Review of Systems Review of Systems  Constitutional: Negative for chills and fever.  HENT: Negative for congestion, ear pain, rhinorrhea and sore throat.   Eyes: Negative for photophobia and visual disturbance.  Respiratory: Negative for cough, chest tightness and shortness of breath.   Cardiovascular: Positive for chest pain. Negative for palpitations and leg swelling.  Gastrointestinal:  Positive for nausea and vomiting. Negative for abdominal pain.  Genitourinary: Negative for difficulty urinating and dysuria.  Musculoskeletal: Negative for myalgias.  Skin: Negative for pallor and rash.  Neurological: Negative for dizziness, syncope, weakness, numbness and headaches.     Physical Exam Updated Vital Signs BP (!) 179/89   Pulse (!) 58   Temp 97.9 F (36.6 C) (Oral)   Resp 12   SpO2 99%   Physical Exam  Constitutional: He appears well-developed and well-nourished. No distress.  HENT:  Head: Normocephalic and atraumatic.  Eyes: Right eye exhibits no discharge. Left eye exhibits no discharge.  Cardiovascular: Normal rate, regular rhythm, normal heart sounds and intact distal pulses.   Pulmonary/Chest: Effort normal and breath sounds normal. No respiratory distress. He has no wheezes. He has no rales. He exhibits no tenderness.  Abdominal: Soft. Bowel sounds are normal. There is no tenderness. There is no guarding.  Musculoskeletal: He exhibits no edema or deformity.  Neurological: He is alert. Coordination normal.  Speech is clear, able to follow commands CN III-XII intact Normal strength in upper and lower extremities bilaterally including dorsiflexion and plantar flexion, strong and equal grip strength Sensation normal to light and sharp touch Moves extremities without ataxia, coordination intact   Skin: Skin is warm and dry. Capillary refill takes less than 2 seconds. He is not diaphoretic.  Psychiatric: He has a normal mood and affect. His behavior is normal.  Nursing note and vitals reviewed.    ED Treatments / Results  Labs (all labs ordered are listed, but only abnormal results are displayed) Labs Reviewed  COMPREHENSIVE METABOLIC PANEL - Abnormal; Notable for the following:       Result Value   Sodium 134 (*)    CO2 18 (*)    Glucose, Bld 123 (*)    AST 64 (*)    ALT 80 (*)    All other components within normal limits  CBC  TROPONIN I  LIPASE,  BLOOD    EKG  EKG Interpretation ED ECG REPORT   Date: 09/05/2016  Rate: 78  Rhythm: normal sinus rhythm  QRS Axis: right  Intervals: QT prolonged borderline  ST/T Wave abnormalities: normal  Conduction Disutrbances:none  Narrative Interpretation:   Old EKG Reviewed: unchanged  I have personally reviewed the EKG tracing and agree with the computerized printout as noted.        Radiology Dg Chest 2 View  Result Date: 09/05/2016 CLINICAL DATA:  Right chest pain today with nausea and weakness. EXAM: CHEST  2 VIEW COMPARISON:  PA and lateral chest 07/02/2016 and 07/21/2015. FINDINGS: The chest is hyperexpanded with attenuation of the pulmonary vasculature. Lungs are clear. Heart size is normal. No pneumothorax or pleural fluid. No focal bony abnormality. IMPRESSION: No acute disease.  The lungs appear emphysematous. Electronically Signed   By: Inge Rise M.D.   On: 09/05/2016 09:03  Procedures Procedures (including critical care time)  Medications Ordered in ED Medications  LORazepam (ATIVAN) injection 0.5 mg (not administered)     Initial Impression / Assessment and Plan / ED Course  I have reviewed the triage vital signs and the nursing notes.  Pertinent labs & imaging results that were available during my care of the patient were reviewed by me and considered in my medical decision making (see chart for details).  Patient presents with intermittent chest pain, patient hypertensive on initial eval vitals otherwise stable. EKG is unchanged from previous, troponin negative. Slight elevation in liver enzymes. Labs otherwise unremarkable. Chest x-ray so was no evidence of acute cardiopulmonary disease. Patient had stable left heart cath July 2018, Chest pain is unlikely due to ACS. Pain and nausea improved on reevaluation after Ativan. Hypertension was improved on re-eval. Instructed patient [facility] have blood pressure and liver enzymes  rechecked over the next week. Return precautions provided, and plan discussed with patient who expressed understanding and is in agreement.  This patient was discussed with Dr. Venora Maples, who is in agreement with plan.  Final Clinical Impressions(s) / ED Diagnoses   Final diagnoses:  Chest pain, unspecified type    New Prescriptions Discharge Medication List as of 09/05/2016 11:35 AM       Jacqlyn Larsen, PA-C 09/06/16 9485    Jola Schmidt, MD 09/08/16 [phone]

## 2016-09-05 NOTE — ED Notes (Signed)
Patient transported to X-ray 

## 2016-09-06 ENCOUNTER — Observation Stay (HOSPITAL_COMMUNITY): Payer: Medicaid Other

## 2016-09-06 ENCOUNTER — Encounter (HOSPITAL_COMMUNITY)
Admission: EM | Disposition: A | Discharge status: Home / Self Care | Payer: Self-pay | Source: Home / Self Care | Attending: Internal Medicine

## 2016-09-06 ENCOUNTER — Encounter (HOSPITAL_COMMUNITY): Payer: Self-pay | Admitting: Cardiovascular Disease

## 2016-09-06 DIAGNOSIS — R7989 Other specified abnormal findings of blood chemistry: Secondary | ICD-10-CM | POA: Diagnosis not present

## 2016-09-06 DIAGNOSIS — I110 Hypertensive heart disease with heart failure: Secondary | ICD-10-CM | POA: Diagnosis present

## 2016-09-06 DIAGNOSIS — K2970 Gastritis, unspecified, without bleeding: Secondary | ICD-10-CM | POA: Diagnosis present

## 2016-09-06 DIAGNOSIS — Z560 Unemployment, unspecified: Secondary | ICD-10-CM | POA: Diagnosis not present

## 2016-09-06 DIAGNOSIS — I25110 Atherosclerotic heart disease of native coronary artery with unstable angina pectoris: Secondary | ICD-10-CM | POA: Diagnosis present

## 2016-09-06 DIAGNOSIS — R0789 Other chest pain: Secondary | ICD-10-CM | POA: Diagnosis present

## 2016-09-06 DIAGNOSIS — F17210 Nicotine dependence, cigarettes, uncomplicated: Secondary | ICD-10-CM | POA: Diagnosis present

## 2016-09-06 DIAGNOSIS — I5021 Acute systolic (congestive) heart failure: Secondary | ICD-10-CM | POA: Diagnosis not present

## 2016-09-06 DIAGNOSIS — I4581 Long QT syndrome: Secondary | ICD-10-CM | POA: Diagnosis not present

## 2016-09-06 DIAGNOSIS — R1011 Right upper quadrant pain: Secondary | ICD-10-CM | POA: Diagnosis not present

## 2016-09-06 DIAGNOSIS — R9431 Abnormal electrocardiogram [ECG] [EKG]: Secondary | ICD-10-CM

## 2016-09-06 DIAGNOSIS — R748 Abnormal levels of other serum enzymes: Secondary | ICD-10-CM | POA: Diagnosis not present

## 2016-09-06 DIAGNOSIS — T502X5A Adverse effect of carbonic-anhydrase inhibitors, benzothiadiazides and other diuretics, initial encounter: Secondary | ICD-10-CM | POA: Diagnosis not present

## 2016-09-06 DIAGNOSIS — I340 Nonrheumatic mitral (valve) insufficiency: Secondary | ICD-10-CM | POA: Diagnosis not present

## 2016-09-06 DIAGNOSIS — R079 Chest pain, unspecified: Secondary | ICD-10-CM | POA: Diagnosis not present

## 2016-09-06 DIAGNOSIS — I43 Cardiomyopathy in diseases classified elsewhere: Secondary | ICD-10-CM | POA: Diagnosis present

## 2016-09-06 DIAGNOSIS — F17200 Nicotine dependence, unspecified, uncomplicated: Secondary | ICD-10-CM | POA: Diagnosis not present

## 2016-09-06 DIAGNOSIS — I252 Old myocardial infarction: Secondary | ICD-10-CM | POA: Diagnosis not present

## 2016-09-06 DIAGNOSIS — R160 Hepatomegaly, not elsewhere classified: Secondary | ICD-10-CM | POA: Diagnosis present

## 2016-09-06 DIAGNOSIS — F1010 Alcohol abuse, uncomplicated: Secondary | ICD-10-CM | POA: Diagnosis present

## 2016-09-06 DIAGNOSIS — R823 Hemoglobinuria: Secondary | ICD-10-CM | POA: Diagnosis present

## 2016-09-06 DIAGNOSIS — I5023 Acute on chronic systolic (congestive) heart failure: Secondary | ICD-10-CM | POA: Diagnosis present

## 2016-09-06 DIAGNOSIS — E871 Hypo-osmolality and hyponatremia: Secondary | ICD-10-CM | POA: Diagnosis present

## 2016-09-06 DIAGNOSIS — F1290 Cannabis use, unspecified, uncomplicated: Secondary | ICD-10-CM | POA: Diagnosis present

## 2016-09-06 DIAGNOSIS — I10 Essential (primary) hypertension: Secondary | ICD-10-CM | POA: Diagnosis not present

## 2016-09-06 DIAGNOSIS — I959 Hypotension, unspecified: Secondary | ICD-10-CM | POA: Diagnosis present

## 2016-09-06 DIAGNOSIS — I2510 Atherosclerotic heart disease of native coronary artery without angina pectoris: Secondary | ICD-10-CM | POA: Diagnosis not present

## 2016-09-06 DIAGNOSIS — B3322 Viral myocarditis: Secondary | ICD-10-CM | POA: Diagnosis not present

## 2016-09-06 DIAGNOSIS — B9789 Other viral agents as the cause of diseases classified elsewhere: Secondary | ICD-10-CM | POA: Diagnosis present

## 2016-09-06 DIAGNOSIS — Z8249 Family history of ischemic heart disease and other diseases of the circulatory system: Secondary | ICD-10-CM | POA: Diagnosis not present

## 2016-09-06 DIAGNOSIS — I400 Infective myocarditis: Secondary | ICD-10-CM | POA: Diagnosis present

## 2016-09-06 DIAGNOSIS — I5041 Acute combined systolic (congestive) and diastolic (congestive) heart failure: Secondary | ICD-10-CM | POA: Diagnosis not present

## 2016-09-06 DIAGNOSIS — I214 Non-ST elevation (NSTEMI) myocardial infarction: Secondary | ICD-10-CM | POA: Diagnosis not present

## 2016-09-06 DIAGNOSIS — N179 Acute kidney failure, unspecified: Secondary | ICD-10-CM | POA: Diagnosis present

## 2016-09-06 DIAGNOSIS — I9589 Other hypotension: Secondary | ICD-10-CM | POA: Diagnosis not present

## 2016-09-06 DIAGNOSIS — I409 Acute myocarditis, unspecified: Secondary | ICD-10-CM | POA: Diagnosis not present

## 2016-09-06 DIAGNOSIS — Z716 Tobacco abuse counseling: Secondary | ICD-10-CM | POA: Diagnosis not present

## 2016-09-06 DIAGNOSIS — E785 Hyperlipidemia, unspecified: Secondary | ICD-10-CM | POA: Diagnosis present

## 2016-09-06 DIAGNOSIS — Z79899 Other long term (current) drug therapy: Secondary | ICD-10-CM | POA: Diagnosis not present

## 2016-09-06 HISTORY — PX: LEFT HEART CATH AND CORONARY ANGIOGRAPHY: CATH118249

## 2016-09-06 LAB — COMPREHENSIVE METABOLIC PANEL
ALBUMIN: 4 g/dL (ref 3.5–5.0)
ALT: 62 U/L (ref 17–63)
AST: 57 U/L — AB (ref 15–41)
Alkaline Phosphatase: 48 U/L (ref 38–126)
Anion gap: 10 (ref 5–15)
BUN: 9 mg/dL (ref 6–20)
CALCIUM: 9 mg/dL (ref 8.9–10.3)
CHLORIDE: 102 mmol/L (ref 101–111)
CO2: 20 mmol/L — ABNORMAL LOW (ref 22–32)
Creatinine, Ser: 0.93 mg/dL (ref 0.61–1.24)
GFR calc Af Amer: >60 mL/min (ref >60)
GFR calc non Af Amer: >60 mL/min (ref >60)
GLUCOSE: 124 mg/dL — AB (ref 65–99)
Potassium: 3.7 mmol/L (ref 3.5–5.1)
SODIUM: 132 mmol/L — AB (ref 135–145)
Total Bilirubin: 0.7 mg/dL (ref 0.3–1.2)
Total Protein: 8.1 g/dL (ref 6.5–8.1)

## 2016-09-06 LAB — HEPATIC FUNCTION PANEL
ALK PHOS: 53 U/L (ref 38–126)
ALT: 72 U/L — ABNORMAL HIGH (ref 17–63)
AST: 57 U/L — ABNORMAL HIGH (ref 15–41)
Albumin: 4.3 g/dL (ref 3.5–5.0)
BILIRUBIN TOTAL: 0.6 mg/dL (ref 0.3–1.2)
Total Protein: 8.9 g/dL — ABNORMAL HIGH (ref 6.5–8.1)

## 2016-09-06 LAB — CBC
HCT: 41.6 % (ref 39.0–52.0)
HEMATOCRIT: 41.4 % (ref 39.0–52.0)
HEMOGLOBIN: 14.5 g/dL (ref 13.0–17.0)
Hemoglobin: 14.7 g/dL (ref 13.0–17.0)
MCH: 33 pg (ref 26.0–34.0)
MCH: 33 pg (ref 26.0–34.0)
MCHC: 35 g/dL (ref 30.0–36.0)
MCHC: 35.3 g/dL (ref 30.0–36.0)
MCV: 94.1 fL (ref 78.0–100.0)
MCV: 93.5 fL (ref 78.0–100.0)
PLATELETS: 237 10*3/uL (ref 150–400)
Platelets: 238 10*3/uL (ref 150–400)
RBC: 4.4 MIL/uL (ref 4.22–5.81)
RBC: 4.45 MIL/uL (ref 4.22–5.81)
RDW: 12.6 % (ref 11.5–15.5)
RDW: 12.7 % (ref 11.5–15.5)
WBC: 8.4 10*3/uL (ref 4.0–10.5)
WBC: 9 10*3/uL (ref 4.0–10.5)

## 2016-09-06 LAB — RAPID URINE DRUG SCREEN, HOSP PERFORMED
Amphetamines: NOT DETECTED
BARBITURATES: NOT DETECTED
BENZODIAZEPINES: NOT DETECTED
COCAINE: NOT DETECTED
Opiates: NOT DETECTED
Tetrahydrocannabinol: POSITIVE — AB

## 2016-09-06 LAB — TROPONIN I
TROPONIN I: 1.22 ng/mL — AB (ref <0.03)
Troponin I: 2.23 ng/mL (ref <0.03)
Troponin I: 3.42 ng/mL (ref <0.03)

## 2016-09-06 LAB — PROTIME-INR
INR: 1.05
Prothrombin Time: 13.6 seconds (ref 11.4–15.2)

## 2016-09-06 LAB — LIPASE, BLOOD: Lipase: 25 U/L (ref 11–51)

## 2016-09-06 LAB — HIV ANTIBODY (ROUTINE TESTING W REFLEX): HIV SCREEN 4TH GENERATION: NONREACTIVE

## 2016-09-06 LAB — CK: Total CK: 455 U/L — ABNORMAL HIGH (ref 49–397)

## 2016-09-06 LAB — AMYLASE: Amylase: 58 U/L (ref 28–100)

## 2016-09-06 LAB — HEPARIN LEVEL (UNFRACTIONATED): HEPARIN UNFRACTIONATED: 0.35 [IU]/mL (ref 0.30–0.70)

## 2016-09-06 SURGERY — LEFT HEART CATH AND CORONARY ANGIOGRAPHY
Anesthesia: LOCAL

## 2016-09-06 MED ORDER — SODIUM CHLORIDE 0.9 % WEIGHT BASED INFUSION: 1.0000 mL/kg/h | INTRAVENOUS | Status: DC

## 2016-09-06 MED ORDER — SUCRALFATE 1 GM/10ML PO SUSP
1.0000 g | Freq: Three times a day (TID) | ORAL | Status: DC
  Administered 2016-09-06 – 2016-09-09 (×13): 1 g via ORAL
  Filled 2016-09-06 (×14): qty 10

## 2016-09-06 MED ORDER — LIDOCAINE HCL (PF) 1 % IJ SOLN
INTRAMUSCULAR | Status: DC | PRN
  Administered 2016-09-06: 2 mL

## 2016-09-06 MED ORDER — HEPARIN SODIUM (PORCINE) 1000 UNIT/ML IJ SOLN
INTRAMUSCULAR | Status: DC | PRN
  Administered 2016-09-06: 4000 [IU] via INTRAVENOUS

## 2016-09-06 MED ORDER — SODIUM CHLORIDE 0.9% FLUSH: 3.0000 mL | INTRAVENOUS | Status: DC | PRN

## 2016-09-06 MED ORDER — SODIUM CHLORIDE 0.9% FLUSH
3.0000 mL | Freq: Two times a day (BID) | INTRAVENOUS | Status: DC
  Administered 2016-09-06 – 2016-09-10 (×9): 3 mL via INTRAVENOUS

## 2016-09-06 MED ORDER — VERAPAMIL HCL 2.5 MG/ML IV SOLN
INTRAVENOUS | Status: AC
  Filled 2016-09-06: qty 2

## 2016-09-06 MED ORDER — FENTANYL CITRATE (PF) 100 MCG/2ML IJ SOLN
INTRAMUSCULAR | Status: AC
  Filled 2016-09-06: qty 2

## 2016-09-06 MED ORDER — MIDAZOLAM HCL 2 MG/2ML IJ SOLN
INTRAMUSCULAR | Status: AC
  Filled 2016-09-06: qty 2

## 2016-09-06 MED ORDER — SODIUM CHLORIDE 0.9 % IV SOLN: 250.0000 mL | INTRAVENOUS | Status: DC | PRN

## 2016-09-06 MED ORDER — LORAZEPAM 1 MG PO TABS
1.0000 mg | ORAL_TABLET | Freq: Four times a day (QID) | ORAL | Status: AC | PRN
  Administered 2016-09-07: 1 mg via ORAL
  Filled 2016-09-06: qty 1

## 2016-09-06 MED ORDER — FENTANYL CITRATE (PF) 100 MCG/2ML IJ SOLN
INTRAMUSCULAR | Status: DC | PRN
  Administered 2016-09-06: 25 ug via INTRAVENOUS

## 2016-09-06 MED ORDER — SODIUM CHLORIDE 0.9 % IV SOLN
INTRAVENOUS | Status: AC
  Administered 2016-09-06: 04:00:00 via INTRAVENOUS

## 2016-09-06 MED ORDER — ASPIRIN EC 81 MG PO TBEC
81.0000 mg | DELAYED_RELEASE_TABLET | Freq: Every day | ORAL | Status: DC
  Administered 2016-09-06 – 2016-09-10 (×5): 81 mg via ORAL
  Filled 2016-09-06 (×5): qty 1

## 2016-09-06 MED ORDER — VITAMIN B-1 100 MG PO TABS
100.0000 mg | ORAL_TABLET | Freq: Every day | ORAL | Status: DC
  Administered 2016-09-06 – 2016-09-10 (×5): 100 mg via ORAL
  Filled 2016-09-06 (×5): qty 1

## 2016-09-06 MED ORDER — HEPARIN (PORCINE) IN NACL 2-0.9 UNIT/ML-% IJ SOLN
INTRAMUSCULAR | Status: DC | PRN
Start: 2016-09-06 — End: 2016-09-06
  Administered 2016-09-06: 1000 mL

## 2016-09-06 MED ORDER — FOLIC ACID 1 MG PO TABS
1.0000 mg | ORAL_TABLET | Freq: Every day | ORAL | Status: DC
  Administered 2016-09-06 – 2016-09-10 (×5): 1 mg via ORAL
  Filled 2016-09-06 (×5): qty 1

## 2016-09-06 MED ORDER — LORAZEPAM 2 MG/ML IJ SOLN: 1.0000 mg | Freq: Four times a day (QID) | INTRAMUSCULAR | Status: AC | PRN

## 2016-09-06 MED ORDER — CARVEDILOL 6.25 MG PO TABS
6.2500 mg | ORAL_TABLET | Freq: Two times a day (BID) | ORAL | Status: DC
  Administered 2016-09-06 – 2016-09-08 (×5): 6.25 mg via ORAL
  Filled 2016-09-06 (×3): qty 1
  Filled 2016-09-06: qty 2
  Filled 2016-09-06 (×2): qty 1

## 2016-09-06 MED ORDER — PROMETHAZINE HCL 25 MG PO TABS
12.5000 mg | ORAL_TABLET | Freq: Three times a day (TID) | ORAL | Status: DC | PRN
  Administered 2016-09-06 (×2): 12.5 mg via ORAL
  Filled 2016-09-06 (×2): qty 1

## 2016-09-06 MED ORDER — IOPAMIDOL (ISOVUE-370) INJECTION 76%
INTRAVENOUS | Status: DC | PRN
  Administered 2016-09-06: 60 mL via INTRA_ARTERIAL

## 2016-09-06 MED ORDER — HEPARIN SODIUM (PORCINE) 1000 UNIT/ML IJ SOLN
INTRAMUSCULAR | Status: AC
  Filled 2016-09-06: qty 1

## 2016-09-06 MED ORDER — IOPAMIDOL (ISOVUE-370) INJECTION 76%
INTRAVENOUS | Status: AC
  Filled 2016-09-06: qty 100

## 2016-09-06 MED ORDER — VERAPAMIL HCL 2.5 MG/ML IV SOLN
INTRAVENOUS | Status: DC | PRN
  Administered 2016-09-06: 10 mL via INTRA_ARTERIAL

## 2016-09-06 MED ORDER — ADULT MULTIVITAMIN W/MINERALS CH
1.0000 | ORAL_TABLET | Freq: Every day | ORAL | Status: DC
  Administered 2016-09-06 – 2016-09-10 (×5): 1 via ORAL
  Filled 2016-09-06 (×5): qty 1

## 2016-09-06 MED ORDER — THIAMINE HCL 100 MG/ML IJ SOLN: 100.0000 mg | Freq: Every day | INTRAMUSCULAR | Status: DC

## 2016-09-06 MED ORDER — PROMETHAZINE HCL 25 MG/ML IJ SOLN
12.5000 mg | Freq: Once | INTRAMUSCULAR | Status: AC
  Administered 2016-09-06: 12.5 mg via INTRAVENOUS
  Filled 2016-09-06: qty 1

## 2016-09-06 MED ORDER — ATORVASTATIN CALCIUM 40 MG PO TABS
40.0000 mg | ORAL_TABLET | Freq: Every day | ORAL | Status: DC
  Administered 2016-09-06 – 2016-09-09 (×5): 40 mg via ORAL
  Filled 2016-09-06 (×5): qty 1

## 2016-09-06 MED ORDER — PROMETHAZINE HCL 25 MG/ML IJ SOLN: 12.5000 mg | Freq: Once | INTRAMUSCULAR | Status: DC

## 2016-09-06 MED ORDER — SODIUM CHLORIDE 0.9% FLUSH
3.0000 mL | Freq: Two times a day (BID) | INTRAVENOUS | Status: DC
  Administered 2016-09-06: 3 mL via INTRAVENOUS

## 2016-09-06 MED ORDER — ISOSORBIDE MONONITRATE ER 30 MG PO TB24: 15.0000 mg | ORAL_TABLET | Freq: Every day | ORAL | Status: DC

## 2016-09-06 MED ORDER — LISINOPRIL 5 MG PO TABS
5.0000 mg | ORAL_TABLET | Freq: Every day | ORAL | Status: DC
  Administered 2016-09-06 – 2016-09-07 (×2): 5 mg via ORAL
  Filled 2016-09-06 (×3): qty 1

## 2016-09-06 MED ORDER — ASPIRIN 81 MG PO CHEW: 81.0000 mg | CHEWABLE_TABLET | ORAL | Status: DC

## 2016-09-06 MED ORDER — MIDAZOLAM HCL 2 MG/2ML IJ SOLN
INTRAMUSCULAR | Status: DC | PRN
  Administered 2016-09-06: 1 mg via INTRAVENOUS

## 2016-09-06 MED ORDER — HEPARIN (PORCINE) IN NACL 2-0.9 UNIT/ML-% IJ SOLN
INTRAMUSCULAR | Status: AC
  Filled 2016-09-06: qty 1000

## 2016-09-06 MED ORDER — LIDOCAINE HCL (PF) 1 % IJ SOLN
INTRAMUSCULAR | Status: AC
  Filled 2016-09-06: qty 30

## 2016-09-06 MED ORDER — LORAZEPAM 2 MG/ML IJ SOLN
1.0000 mg | Freq: Once | INTRAMUSCULAR | Status: AC
  Administered 2016-09-06: 1 mg via INTRAVENOUS
  Filled 2016-09-06: qty 1

## 2016-09-06 MED ORDER — SODIUM CHLORIDE 0.9 % WEIGHT BASED INFUSION: 3.0000 mL/kg/h | INTRAVENOUS | Status: DC

## 2016-09-06 SURGICAL SUPPLY — 10 items

## 2016-09-06 NOTE — H&P (View-Only) (Signed)
Progress Note  Patient Name: [facility] Date of Encounter: 09/06/2016  Primary Cardiologist: Dr. Meda Coffee  Subjective   Pt reports continued chest pain at rest on nitro gtt. Denies shortness of breath  Inpatient Medications    Scheduled Meds: . aspirin EC  81 mg Oral Daily  . atorvastatin  40 mg Oral q1800  . folic acid  1 mg Oral Daily  . lisinopril  5 mg Oral Daily  . multivitamin with minerals  1 tablet Oral Daily  . thiamine  100 mg Oral Daily   Or  . thiamine  100 mg Intravenous Daily   Continuous Infusions: . sodium chloride 100 mL/hr at 09/06/16 0644  . heparin 1,100 Units/hr (09/06/16 0644)  . nitroGLYCERIN 30 mcg/min (09/06/16 0644)   PRN Meds: LORazepam **OR** LORazepam, promethazine   Vital Signs    Vitals:   09/06/16 0230 09/06/16 0245 09/06/16 0300 09/06/16 0343  BP: (!) 146/96 (!) 164/104 (!) 179/133 (!) 142/98  Pulse: 89 85 85 86  Resp: (!) 8 14 16 12   Temp:    98.5 F (36.9 C)  TempSrc:    Oral  SpO2: 96% 96% 98% 100%  Weight:    179 lb (81.2 kg)  Height:    5\' 9"  (1.753 m)    Intake/Output Summary (Last 24 hours) at 09/06/16 0735 Last data filed at 09/06/16 3790  Gross per 24 hour  Intake           389.58 ml  Output              150 ml  Net           239.58 ml   Filed Weights   09/06/16 0343  Weight: 179 lb (81.2 kg)     Physical Exam   General: Well developed, well nourished, male appearing in no acute distress. Head: Normocephalic, atraumatic.  Neck: Supple without bruits, no JVD. Lungs:  Resp regular and unlabored, CTA. Heart: RRR, S1, S2, no S3, S4, or murmur; no rub. Abdomen: Soft, non-tender, non-distended with normoactive bowel sounds. No hepatomegaly. No rebound/guarding. No obvious abdominal masses. Extremities: No clubbing, cyanosis, trace edema. Distal pedal pulses are 2+ bilaterally. Neuro: Alert and oriented X 3. Moves all extremities spontaneously. Psych: Normal affect.  Labs    Chemistry Recent Labs Lab  09/05/16 0842 09/05/16 1928 09/05/16 2306 09/06/16 0345  NA 134* 134*  --  132*  K 4.0 4.3  --  3.7  CL 104 100*  --  102  CO2 18* 21*  --  20*  GLUCOSE 123* 133*  --  124*  BUN 15 8  --  9  CREATININE 1.22 1.09  --  0.93  CALCIUM 9.4 9.7  --  9.0  PROT 8.1 9.1* 8.9* 8.1  ALBUMIN 4.1 4.4 4.3 4.0  AST 64* 55* 57* 57*  ALT 80* 76* 72* 62  ALKPHOS 55 55 53 48  BILITOT 0.8 0.7 0.6 0.7  GFRNONAA >60 >60  --  >60  GFRAA >60 >60  --  >60  ANIONGAP 12 13  --  10     Hematology Recent Labs Lab 09/05/16 1928 09/06/16 0345 09/06/16 0700  WBC 6.4 8.4 9.0  RBC 4.64 4.40 4.45  HGB 15.5 14.5 14.7  HCT 43.9 41.4 41.6  MCV 94.6 94.1 93.5  MCH 33.4 33.0 33.0  MCHC 35.3 35.0 35.3  RDW 12.6 12.6 12.7  PLT 259 238 237    Cardiac Enzymes Recent Labs Lab 09/05/16 0842 09/05/16  2306 09/06/16 0345  TROPONINI <0.03 1.22* 2.23*    Recent Labs Lab 09/05/16 1950 09/05/16 2324  TROPIPOC 0.31* 0.97*     BNPNo results for input(s): BNP, PROBNP in the last 168 hours.   DDimer No results for input(s): DDIMER in the last 168 hours.   Radiology    Dg Chest 2 View  Result Date: 09/05/2016 CLINICAL DATA:  Right chest pain today with nausea and weakness. EXAM: CHEST  2 VIEW COMPARISON:  PA and lateral chest 07/02/2016 and 07/21/2015. FINDINGS: The chest is hyperexpanded with attenuation of the pulmonary vasculature. Lungs are clear. Heart size is normal. No pneumothorax or pleural fluid. No focal bony abnormality. IMPRESSION: No acute disease.  The lungs appear emphysematous. Electronically Signed   By: Inge Rise M.D.   On: 09/05/2016 09:03     Telemetry    Sinus rhythm - sinus tachycardia - Personally Reviewed  ECG    Sinus rhythm with 1st degree heart bloc, prolonged QTc at 486 ms - Personally Reviewed   Cardiac Studies   Left heart cath: pending  Patient Profile     61 y.o. male  hx of nonobstructive CAD by cath 07/2016  who is being seen today for the evaluation  of chest pain and elevated troponin. He has a hx of hyperlipidemia, HTN, ETOH and tobacco abuse, QT prolongation and NSTEMI in 07/2015 at which time his trop peaked at 2.  Cath at that time showed mild plaque and EF was 40-45% and it was felt that he had coronary vasospasm or Takotsubo variant and medical management was recommended.  Assessment & Plan    1. Unstable angina - troponin trended up overnight: 0.03 --> 1.22 --> 2.23 - he reports active chest pain at rest this morning - continue nitro gtt and heparin gtt - will plan for cardiac catheterization today - NPO  2. HTN - on nitro gtt - sBP elevated 140-180 - home meds imdur (on hold) and lisinopril - will titrate for tighter control after procedure  3. ETOH history - per primary team, CIWA - no signs of withdrawal this morning  4. Prolonged QTc - hold prolonging agents - repeat EKG following cath   Signed, Ledora Bottcher , PA-C 7:35 AM 09/06/2016 Pager: [phone]  Personally seen and examined. Agree with above.  61 year old with NSTEMI  Sitting up in bed, nausea, +CP, no SOB, +diaphroetic  Alert, 2+radial, RRR/mildly tachy, CTAB, soft abd.  NSTEMI  - cath, risks and benefits discussed  - Trop now 2 range  - Prior cath in July, non obstructive dz.   - hep IV   ETOH  - CIWA  - treat for gastritis as well  Hypertension  - abdominal aortogram would be helpful to exclude abdominal aorta pathology during diagnostic angio.   - Abd u/s no gall bladder dz. Hemangioma of liver  - lipase normal  - NTG IV, coreg 6.25 BID, imdur  - consider amlodipine if continued HTN   QT prolongation  - mild. No adverse rhythms.   Candee Furbish, MD

## 2016-09-06 NOTE — Progress Notes (Signed)
ANTICOAGULATION CONSULT NOTE - Follow Up Consult  Pharmacy Consult for heparin Indication: chest pain/ACS  No Known Allergies  Patient Measurements: Height: 5\' 9"  (175.3 cm) Weight: 179 lb (81.2 kg) IBW/kg (Calculated) : 70.7 Heparin Dosing Weight: 81.2 kg  Vital Signs: Temp: 98.9 F (37.2 C) (09/05 0756) Temp Source: Oral (09/05 0756) BP: 155/98 (09/05 0756) Pulse Rate: 103 (09/05 0756)  Labs:  Recent Labs  09/05/16 0842 09/05/16 1928 09/05/16 2306 09/06/16 0345 09/06/16 0700 09/06/16 0804  HGB 15.0 15.5  --  14.5 14.7  --   HCT 43.2 43.9  --  41.4 41.6  --   PLT 234 259  --  238 237  --   LABPROT  --   --   --   --   --  13.6  INR  --   --   --   --   --  1.05  HEPARINUNFRC  --   --   --   --  0.35  --   CREATININE 1.22 1.09  --  0.93  --   --   CKTOTAL  --   --   --   --  455*  --   TROPONINI <0.03  --  1.22* 2.23*  --   --     Estimated Creatinine Clearance: 83.4 mL/min (by C-G formula based on SCr of 0.93 mg/dL).   Medications:  Scheduled:  . aspirin EC  81 mg Oral Daily  . atorvastatin  40 mg Oral q1800  . folic acid  1 mg Oral Daily  . lisinopril  5 mg Oral Daily  . multivitamin with minerals  1 tablet Oral Daily  . thiamine  100 mg Oral Daily   Or  . thiamine  100 mg Intravenous Daily   Infusions:  . sodium chloride 100 mL/hr at 09/06/16 0644  . heparin 1,100 Units/hr (09/06/16 0644)  . nitroGLYCERIN 30 mcg/min (09/06/16 0131)    Assessment: 61 y/o male with history of recent nonobstructive CAD by cath 07/2016 managed conservatively, admitted for similar chest pain/ACS. Continues on heparin and NTG drip. Patient took ASA 325 x1.Troponins trended up overnight, continued chest pain this morning. Planned cardiac cath today. First heparin level therapeutic at 0.35 this morning. CBC stable, no bleeding noted.  Goal of Therapy:  Heparin level 0.3-0.7 units/ml Monitor platelets by anticoagulation protocol: Yes   Plan:  Cardiac cath today Continue  heparin 1100 unit/hr Daily heparin level if continuing heparin post-cath Monitor CBC, s/sx of bleeding   Charlene Brooke, PharmD PGY1 [facility] Resident Pager: [phone] After 4:00PM please call [place] [phone] 09/06/2016,8:51 AM

## 2016-09-06 NOTE — Progress Notes (Signed)
Pt right wrist shaved as per order. Pt has refused to have both his groin shaved. No you ain't doing that" in reference to his groin.

## 2016-09-06 NOTE — Interval H&P Note (Signed)
History and Physical Interval Note:  09/06/2016 11:22 AM  Andres Boyd  has presented today for surgery, with the diagnosis of cp  The various methods of treatment have been discussed with the patient and family. After consideration of risks, benefits and other options for treatment, the patient has consented to  Procedure(s): LEFT HEART CATH AND CORONARY ANGIOGRAPHY (N/A) as a surgical intervention .  The patient's history has been reviewed, patient examined, no change in status, stable for surgery.  I have reviewed the patient's chart and labs.  Questions were answered to the patient's satisfaction.     Sherren Mocha

## 2016-09-06 NOTE — Progress Notes (Signed)
Right radial bled  As pt was attempting to use the urinal at 2 PM . Had given additional 3 cc air to t-band. To stop bleeding  Which was effective. Right radial off T band now and replaced with pressure dressing covered with Tegaderm.

## 2016-09-06 NOTE — H&P (Signed)
History and Physical    Samarion Ehle HDQ:[id number] DOB: August 25, 1955 DOA: 09/05/2016  PCP: Patient, No Pcp Per   Patient coming from: Home   Chief Complaint: Chest pain  HPI: Andres Boyd is a 61 y.o. male with medical history significant for-  NSTEMI- 07/2015, QT prolongation tobacco abuse, alcohol abuse. Patient presented with complaints of chest pain- mostly center to right chest region, that started earlier this morning, that woke him up from sleep. Patient reported associated vomiting 5 episodes today, nonbloody. Patient also endorses some epigastric/right upper quadrant pain with chest pain.  Patient initially presented to the ED this a.m. Morning, troponin was negative, EKG was unchanged and with results of recent cardiac cath with nonobstructive disease July 2018 patient was sent home with return precautions. Patient presented later in the evening evening with persistent chest pain, and vomiting.  Patient smokes about 1 pack per day, and drinks 2-3 cans of beer almost daily. Denies cocaine use.  ED Course: Blood pressure 160/95 otherwise stable vitals, blood work this evening- elevated troponin 1.22, CBC- hemoglobin stable at 15.5, sodium mildly low 134, mildly low bicarbonate 21, chloride- 100, mildly elevated liver enzymes. 2. Chest x-ray showed emphysema. EKG - ST and T wave changes in anterior leads. Cardiology was consulted in the ED- with recommendations for IV heparin and nitroglycerin drip.   Review of Systems: As per HPI otherwise 10 point review of systems negative.   Past Medical History:  Diagnosis Date  . 1st degree AV block   . Coronary artery disease   . ETOH abuse   . Hyperlipidemia   . Hypertension   . Myocardial infarct ([place]) 07/2015  . Tobacco abuse     Past Surgical History:  Procedure Laterality Date  . CARDIAC CATHETERIZATION N/A 07/19/2015   Procedure: Left Heart Cath and Coronary Angiography;  Surgeon: Jettie Booze, MD;  Location: [facility];   Service: Cardiovascular;  Laterality: N/A;  . LEFT HEART CATH AND CORONARY ANGIOGRAPHY N/A 07/04/2016   Procedure: Left Heart Cath and Coronary Angiography;  Surgeon: Jettie Booze, MD;  Location: [facility];  Service: Cardiovascular;  Laterality: N/A;     reports that he has been smoking Cigarettes.  He has a 36.00 pack-year smoking history. He has never used smokeless tobacco. He reports that he drinks about 1.2 - 1.8 oz of alcohol per week . He reports that he uses drugs, including Marijuana.  No Known Allergies  Family History  Problem Relation Age of Onset  . Diabetes Mother   . Hypertension Mother   . Diabetes Father    Prior to Admission medications   Medication Sig Start Date End Date Taking? Authorizing Provider  atorvastatin (LIPITOR) 40 MG tablet Take 1 tablet (40 mg total) by mouth daily at 6 PM. 08/26/15  Yes Imogene Burn, PA-C  isosorbide mononitrate (IMDUR) 30 MG 24 hr tablet Take 0.5 tablets (15 mg total) by mouth daily. 02/10/16  Yes Dorothy Spark, MD  lisinopril (PRINIVIL,ZESTRIL) 5 MG tablet Take 1 tablet (5 mg total) by mouth daily. 08/26/15  Yes Imogene Burn, PA-C  Multiple Vitamin (MULTIVITAMIN WITH MINERALS) TABS tablet Take 1 tablet by mouth daily.   Yes [provider]    Physical Exam: Vitals:   09/05/16 2345 09/06/16 0015 09/06/16 0030 09/06/16 0100  BP: (!) 163/92 (!) 150/96 120/69 (!) 148/96  Pulse: 81 81 91 77  Resp: 13 (!) 21 (!) 26 (!) 21  Temp:  TempSrc:      SpO2: 98% 97% 97% 98%    Constitutional: NAD, calm, comfortable Vitals:   09/05/16 2345 09/06/16 0015 09/06/16 0030 09/06/16 0100  BP: (!) 163/92 (!) 150/96 120/69 (!) 148/96  Pulse: 81 81 91 77  Resp: 13 (!) 21 (!) 26 (!) 21  Temp:      TempSrc:      SpO2: 98% 97% 97% 98%   Eyes: PERRL, lids and conjunctivae normal ENMT: Mucous membranes are dry. Posterior pharynx clear of any exudate or lesions.Normal dentition.  Neck: normal, supple, no masses,  no thyromegaly Respiratory: clear to auscultation bilaterally, no wheezing, no crackles. Normal respiratory effort. No accessory muscle use.  Cardiovascular: Regular rate and rhythm, no murmurs / rubs / gallops. No extremity edema. 2+ pedal pulses. Abdomen: no tenderness, no masses palpated. No hepatosplenomegaly. Bowel sounds positive.  Musculoskeletal: no clubbing / cyanosis. No joint deformity upper and lower extremities. Good ROM, no contractures. Normal muscle tone.  Skin: no rashes, lesions, ulcers. No induration Neurologic: CN 2-12 grossly intact. Sensation intact, DTR normal. Strength 5/5 in all 4.  Psychiatric: Normal judgment and insight. Alert and oriented x 3. Normal mood.   Labs on Admission: I have personally reviewed following labs and imaging studies  CBC:  Recent Labs Lab 09/05/16 0842 09/05/16 1928  WBC 5.1 6.4  HGB 15.0 15.5  HCT 43.2 43.9  MCV 95.2 94.6  PLT 234 735   Basic Metabolic Panel:  Recent Labs Lab 09/05/16 0842 09/05/16 1928  NA 134* 134*  K 4.0 4.3  CL 104 100*  CO2 18* 21*  GLUCOSE 123* 133*  BUN 15 8  CREATININE 1.22 1.09  CALCIUM 9.4 9.7   Liver Function Tests:  Recent Labs Lab 09/05/16 0842 09/05/16 1928 09/05/16 2306  AST 64* 55* 57*  ALT 80* 76* 72*  ALKPHOS 55 55 53  BILITOT 0.8 0.7 0.6  PROT 8.1 9.1* 8.9*  ALBUMIN 4.1 4.4 4.3    Recent Labs Lab 09/05/16 0952 09/05/16 1928 09/05/16 2306  LIPASE 26 22 25   AMYLASE  --   --  58   Cardiac Enzymes:  Recent Labs Lab 09/05/16 0842 09/05/16 2306  TROPONINI <0.03 1.22*   Urine analysis:    Component Value Date/Time   COLORURINE YELLOW 09/05/2016 1920   APPEARANCEUR CLEAR 09/05/2016 1920   LABSPEC 1.026 09/05/2016 1920   PHURINE 5.0 09/05/2016 1920   GLUCOSEU NEGATIVE 09/05/2016 1920   HGBUR MODERATE (A) 09/05/2016 1920   BILIRUBINUR NEGATIVE 09/05/2016 1920   KETONESUR 20 (A) 09/05/2016 1920   PROTEINUR 100 (A) 09/05/2016 1920   UROBILINOGEN 0.2 07/01/2007  0821   NITRITE NEGATIVE 09/05/2016 1920   LEUKOCYTESUR NEGATIVE 09/05/2016 1920    Radiological Exams on Admission: Dg Chest 2 View  Result Date: 09/05/2016 CLINICAL DATA:  Right chest pain today with nausea and weakness. EXAM: CHEST  2 VIEW COMPARISON:  PA and lateral chest 07/02/2016 and 07/21/2015. FINDINGS: The chest is hyperexpanded with attenuation of the pulmonary vasculature. Lungs are clear. Heart size is normal. No pneumothorax or pleural fluid. No focal bony abnormality. IMPRESSION: No acute disease.  The lungs appear emphysematous. Electronically Signed   By: Inge Rise M.D.   On: 09/05/2016 09:03    EKG: Independently reviewed. ST and T wave changes anteriorly V3 through V5 new.  Assessment/Plan Principal Problem:   Chest pain Active Problems:   Hypertension   Tobacco use disorder   Alcohol abuse   Chest pain- history of NSTEMI- 07/2016.  Cardiac cath 07/04/2016- 25-50% nonobstructive disease.  - Cardiology following recommendations appreciated, a troponin  comes back for elevated and continues to have chest pain while on heparin and nitroglycerin gtt, will take to cath Lab tonight. - Heparin drip, nitroglycerin drip - Aspirin 325 mg daily earlier today, continue 81mg  daily - Start Lipitor 40 mg daily - hold home imdur, while on nitro drip - IV fluid normal saline with dry mucous membranes X 12 hrs, and slight creatinine elevation 1.2 from baseline 1.0. - Follow-up UDS - IV Phenergan  Right upper quadrant/epigastric pain- with new transaminitis- AST- 55, ALT 76. the pattern of alcoholic Normal ALP and bilirubin. Lipase 22. UA- negative for nitrites and bacteria positive but positive for hemoglobin. - Complete abdominal ultrasound, considering presence of hematuria - Acute Hepatitis panel  Alcohol abuse- last drink evening of 09/04/2016. Drinks 2-3 beers almost daily. Trying to cut down - CIWA - Thiamine folate  Moderate Hemoglobinuria- new. 0-5 rbcs. 100 protein. No  UTI symptoms and UA not suggestive of UTI. No flank pain. - Possibly glomerular disease. - Check CK, rule out rhabdomyolysis causing hemoglobinuria  Prolonged QTC- 498 - Avoid QT prolongation medications, will use Phenergan PRN instead of Zofran  DVT prophylaxis: Heparin Code Status: Full Family Communication: None at bedside Disposition Plan: Home Consults called: Cardiology Admission status: Observation, tele  Bethena Roys MD [facility] Pager [phone]  If 2Am-7AM, please contact night-coverage [email or web address] Password [email or web address]  09/06/2016, 1:39 AM

## 2016-09-06 NOTE — Progress Notes (Signed)
Progress Note  Patient Name: Andres Boyd Date of Encounter: 09/06/2016  Primary Cardiologist: Dr. Meda Coffee  Subjective   Pt reports continued chest pain at rest on nitro gtt. Denies shortness of breath  Inpatient Medications    Scheduled Meds: . aspirin EC  81 mg Oral Daily  . atorvastatin  40 mg Oral q1800  . folic acid  1 mg Oral Daily  . lisinopril  5 mg Oral Daily  . multivitamin with minerals  1 tablet Oral Daily  . thiamine  100 mg Oral Daily   Or  . thiamine  100 mg Intravenous Daily   Continuous Infusions: . sodium chloride 100 mL/hr at 09/06/16 0644  . heparin 1,100 Units/hr (09/06/16 0644)  . nitroGLYCERIN 30 mcg/min (09/06/16 0644)   PRN Meds: LORazepam **OR** LORazepam, promethazine   Vital Signs    Vitals:   09/06/16 0230 09/06/16 0245 09/06/16 0300 09/06/16 0343  BP: (!) 146/96 (!) 164/104 (!) 179/133 (!) 142/98  Pulse: 89 85 85 86  Resp: (!) 8 14 16 12   Temp:    98.5 F (36.9 C)  TempSrc:    Oral  SpO2: 96% 96% 98% 100%  Weight:    179 lb (81.2 kg)  Height:    5\' 9"  (1.753 m)    Intake/Output Summary (Last 24 hours) at 09/06/16 0735 Last data filed at 09/06/16 3474  Gross per 24 hour  Intake           389.58 ml  Output              150 ml  Net           239.58 ml   Filed Weights   09/06/16 0343  Weight: 179 lb (81.2 kg)     Physical Exam   General: Well developed, well nourished, male appearing in no acute distress. Head: Normocephalic, atraumatic.  Neck: Supple without bruits, no JVD. Lungs:  Resp regular and unlabored, CTA. Heart: RRR, S1, S2, no S3, S4, or murmur; no rub. Abdomen: Soft, non-tender, non-distended with normoactive bowel sounds. No hepatomegaly. No rebound/guarding. No obvious abdominal masses. Extremities: No clubbing, cyanosis, trace edema. Distal pedal pulses are 2+ bilaterally. Neuro: Alert and oriented X 3. Moves all extremities spontaneously. Psych: Normal affect.  Labs    Chemistry Recent Labs Lab  09/05/16 0842 09/05/16 1928 09/05/16 2306 09/06/16 0345  NA 134* 134*  --  132*  K 4.0 4.3  --  3.7  CL 104 100*  --  102  CO2 18* 21*  --  20*  GLUCOSE 123* 133*  --  124*  BUN 15 8  --  9  CREATININE 1.22 1.09  --  0.93  CALCIUM 9.4 9.7  --  9.0  PROT 8.1 9.1* 8.9* 8.1  ALBUMIN 4.1 4.4 4.3 4.0  AST 64* 55* 57* 57*  ALT 80* 76* 72* 62  ALKPHOS 55 55 53 48  BILITOT 0.8 0.7 0.6 0.7  GFRNONAA >60 >60  --  >60  GFRAA >60 >60  --  >60  ANIONGAP 12 13  --  10     Hematology Recent Labs Lab 09/05/16 1928 09/06/16 0345 09/06/16 0700  WBC 6.4 8.4 9.0  RBC 4.64 4.40 4.45  HGB 15.5 14.5 14.7  HCT 43.9 41.4 41.6  MCV 94.6 94.1 93.5  MCH 33.4 33.0 33.0  MCHC 35.3 35.0 35.3  RDW 12.6 12.6 12.7  PLT 259 238 237    Cardiac Enzymes Recent Labs Lab 09/05/16 0842 09/05/16  2306 09/06/16 0345  TROPONINI <0.03 1.22* 2.23*    Recent Labs Lab 09/05/16 1950 09/05/16 2324  TROPIPOC 0.31* 0.97*     BNPNo results for input(s): BNP, PROBNP in the last 168 hours.   DDimer No results for input(s): DDIMER in the last 168 hours.   Radiology    Dg Chest 2 View  Result Date: 09/05/2016 CLINICAL DATA:  Right chest pain today with nausea and weakness. EXAM: CHEST  2 VIEW COMPARISON:  PA and lateral chest 07/02/2016 and 07/21/2015. FINDINGS: The chest is hyperexpanded with attenuation of the pulmonary vasculature. Lungs are clear. Heart size is normal. No pneumothorax or pleural fluid. No focal bony abnormality. IMPRESSION: No acute disease.  The lungs appear emphysematous. Electronically Signed   By: Inge Rise M.D.   On: 09/05/2016 09:03     Telemetry    Sinus rhythm - sinus tachycardia - Personally Reviewed  ECG    Sinus rhythm with 1st degree heart bloc, prolonged QTc at 486 ms - Personally Reviewed   Cardiac Studies   Left heart cath: pending  Patient Profile     61 y.o. male  hx of nonobstructive CAD by cath 07/2016  who is being seen today for the evaluation  of chest pain and elevated troponin. He has a hx of hyperlipidemia, HTN, ETOH and tobacco abuse, QT prolongation and NSTEMI in 07/2015 at which time his trop peaked at 2.  Cath at that time showed mild plaque and EF was 40-45% and it was felt that he had coronary vasospasm or Takotsubo variant and medical management was recommended.  Assessment & Plan    1. Unstable angina - troponin trended up overnight: 0.03 --> 1.22 --> 2.23 - he reports active chest pain at rest this morning - continue nitro gtt and heparin gtt - will plan for cardiac catheterization today - NPO  2. HTN - on nitro gtt - sBP elevated 140-180 - home meds imdur (on hold) and lisinopril - will titrate for tighter control after procedure  3. ETOH history - per primary team, CIWA - no signs of withdrawal this morning  4. Prolonged QTc - hold prolonging agents - repeat EKG following cath   Signed, Ledora Bottcher , PA-C 7:35 AM 09/06/2016 Pager: [phone]  Personally seen and examined. Agree with above.  61 year old with NSTEMI  Sitting up in bed, nausea, +CP, no SOB, +diaphroetic  Alert, 2+radial, RRR/mildly tachy, CTAB, soft abd.  NSTEMI  - cath, risks and benefits discussed  - Trop now 2 range  - Prior cath in July, non obstructive dz.   - hep IV   ETOH  - CIWA  - treat for gastritis as well  Hypertension  - abdominal aortogram would be helpful to exclude abdominal aorta pathology during diagnostic angio.   - Abd u/s no gall bladder dz. Hemangioma of liver  - lipase normal  - NTG IV, coreg 6.25 BID, imdur  - consider amlodipine if continued HTN   QT prolongation  - mild. No adverse rhythms.   Candee Furbish, MD

## 2016-09-06 NOTE — Progress Notes (Addendum)
Patient admitted after midnight, please see H&P.  Plan for cath today. Will need outpatinet MRI Of liver to evaluate lesion.  Eulogio Bear DO

## 2016-09-07 ENCOUNTER — Inpatient Hospital Stay (HOSPITAL_COMMUNITY): Payer: Medicaid Other

## 2016-09-07 DIAGNOSIS — I340 Nonrheumatic mitral (valve) insufficiency: Secondary | ICD-10-CM

## 2016-09-07 DIAGNOSIS — I409 Acute myocarditis, unspecified: Secondary | ICD-10-CM

## 2016-09-07 DIAGNOSIS — B3322 Viral myocarditis: Secondary | ICD-10-CM

## 2016-09-07 DIAGNOSIS — R9431 Abnormal electrocardiogram [ECG] [EKG]: Secondary | ICD-10-CM

## 2016-09-07 LAB — ECHOCARDIOGRAM COMPLETE
FS: 10 % — AB (ref 28–44)
HEIGHTINCHES: 69 in
IVS/LV PW RATIO, ED: 1.1
LA diam end sys: 28 mm
LA vol index: 19 mL/m2
LADIAMINDEX: 1.41 cm/m2
LASIZE: 28 mm
LAVOL: 37.7 mL
LAVOLA4C: 31.4 mL
LDCA: 3.14 cm2
LV SIMPSON'S DISK: 28
LV sys vol index: 45 mL/m2
LVDIAVOL: 124 mL (ref 62–150)
LVDIAVOLIN: 63 mL/m2
LVOT SV: 36 mL
LVOT VTI: 11.5 cm
LVOT diameter: 20 mm
LVOT peak grad rest: 2 mmHg
LVOT peak vel: 78.9 cm/s
LVSYSVOL: 89 mL — AB (ref 21–61)
Lateral S' vel: 7.27 cm/s
PW: 10 mm — AB (ref 0.6–1.1)
Reg peak vel: 228 cm/s
Stroke v: 35 ml
TAPSE: 20.3 mm
TR max vel: 228 cm/s
WEIGHTICAEL: 2808 [oz_av]

## 2016-09-07 LAB — HEPATITIS PANEL, ACUTE
HCV Ab: <0.1 s/co ratio (ref 0.0–0.9)
HEP A IGM: NEGATIVE
HEP B C IGM: NEGATIVE
Hepatitis B Surface Ag: NEGATIVE

## 2016-09-07 LAB — CBC
HEMATOCRIT: 48.2 % (ref 39.0–52.0)
HEMOGLOBIN: 16.9 g/dL (ref 13.0–17.0)
MCH: 32.6 pg (ref 26.0–34.0)
MCHC: 35.1 g/dL (ref 30.0–36.0)
MCV: 93.1 fL (ref 78.0–100.0)
Platelets: 271 10*3/uL (ref 150–400)
RBC: 5.18 MIL/uL (ref 4.22–5.81)
RDW: 12.8 % (ref 11.5–15.5)
WBC: 7.5 10*3/uL (ref 4.0–10.5)

## 2016-09-07 LAB — MAGNESIUM: Magnesium: 2.3 mg/dL (ref 1.7–2.4)

## 2016-09-07 LAB — FERRITIN: Ferritin: 286 ng/mL (ref 24–336)

## 2016-09-07 LAB — SEDIMENTATION RATE: Sed Rate: 8 mm/hr (ref 0–16)

## 2016-09-07 LAB — D-DIMER, QUANTITATIVE (NOT AT ARMC): D DIMER QUANT: 0.46 ug{FEU}/mL (ref 0.00–0.50)

## 2016-09-07 MED ORDER — POTASSIUM CHLORIDE CRYS ER 20 MEQ PO TBCR
20.0000 meq | EXTENDED_RELEASE_TABLET | Freq: Two times a day (BID) | ORAL | Status: DC
  Administered 2016-09-07 – 2016-09-09 (×5): 20 meq via ORAL
  Filled 2016-09-07 (×4): qty 1

## 2016-09-07 MED ORDER — ISOSORBIDE MONONITRATE ER 30 MG PO TB24
15.0000 mg | ORAL_TABLET | Freq: Every day | ORAL | Status: DC
  Administered 2016-09-07: 15 mg via ORAL
  Filled 2016-09-07 (×2): qty 1

## 2016-09-07 MED ORDER — PANTOPRAZOLE SODIUM 40 MG PO TBEC
40.0000 mg | DELAYED_RELEASE_TABLET | Freq: Every day | ORAL | Status: DC
  Administered 2016-09-07 – 2016-09-10 (×4): 40 mg via ORAL
  Filled 2016-09-07 (×3): qty 1

## 2016-09-07 MED ORDER — GADOBENATE DIMEGLUMINE 529 MG/ML IV SOLN
40.0000 mL | Freq: Once | INTRAVENOUS | Status: AC
  Administered 2016-09-07: 32 mL via INTRAVENOUS

## 2016-09-07 MED ORDER — FUROSEMIDE 10 MG/ML IJ SOLN
40.0000 mg | Freq: Two times a day (BID) | INTRAMUSCULAR | Status: DC
  Administered 2016-09-07 (×2): 40 mg via INTRAVENOUS
  Filled 2016-09-07 (×4): qty 4

## 2016-09-07 NOTE — Care Management Note (Addendum)
Case Management Note  Patient Details  Name: Andres Boyd MRN: [id number] Date of Birth: [birth year]  Subjective/Objective: Pt presented for Chest Pain. Pt states he is without a PCP- usually follows up at the Cardiologist office for Rx's. Pt has Medicaid and gets medications without any problems.                   Action/Plan: CM did provide pt with a list of PCP's that are accepting new patients with Medicaid. Pt will need to call to set up appointment. No further needs from CM at this time.   Expected Discharge Date:                  Expected Discharge Plan:  Home/Self Care  In-House Referral:  NA  Discharge planning Services  CM Consult (List of PCP accepting Medicaid was provided to patient. )  Post Acute Care Choice:  NA Choice offered to:  NA  DME Arranged:  N/A DME Agency:  NA  HH Arranged:  NA HH Agency:  NA  Status of Service:  Completed, signed off  If discussed at [place] of Stay Meetings, dates discussed:    Additional Comments: 1258 09-08-16 Jacqlyn Krauss, RN,BSN [phone] CM did speak with Liaison Joelene Millin for [facility] and they will follow the patient outpatient since he has Medicaid. The [facility] will follow as well. No further needs at this time.   Bethena Roys, RN 09/07/2016, 2:54 PM

## 2016-09-07 NOTE — Progress Notes (Signed)
Progress Note  Patient Name: Andres Boyd Date of Encounter: 09/07/2016  Primary Cardiologist: Dr. Meda Coffee  Subjective   Patient is feeling better, less nausea; denies chest pain, SOB, and palpitations.  Inpatient Medications    Scheduled Meds: . aspirin EC  81 mg Oral Daily  . atorvastatin  40 mg Oral q1800  . carvedilol  6.25 mg Oral BID WC  . folic acid  1 mg Oral Daily  . lisinopril  5 mg Oral Daily  . multivitamin with minerals  1 tablet Oral Daily  . sodium chloride flush  3 mL Intravenous Q12H  . sucralfate  1 g Oral TID WC & HS  . thiamine  100 mg Oral Daily   Or  . thiamine  100 mg Intravenous Daily   Continuous Infusions: . sodium chloride    . nitroGLYCERIN 35 mcg/min (09/07/16 0225)   PRN Meds: sodium chloride, LORazepam **OR** LORazepam, promethazine, sodium chloride flush   Vital Signs    Vitals:   09/06/16 2100 09/06/16 2356 09/07/16 0535 09/07/16 0557  BP: (!) 128/94 (!) 142/103 (!) 128/96   Pulse: 90 94 95   Resp: (!) 25 17 10 17   Temp: 99.2 F (37.3 C) 97.8 F (36.6 C) 98.5 F (36.9 C)   TempSrc: Oral Axillary Oral   SpO2: 96% 97% 100%   Weight:   175 lb 8 oz (79.6 kg)   Height:        Intake/Output Summary (Last 24 hours) at 09/07/16 0801 Last data filed at 09/07/16 0225  Gross per 24 hour  Intake           425.08 ml  Output              700 ml  Net          -274.92 ml   Filed Weights   09/06/16 0343 09/07/16 0535  Weight: 179 lb (81.2 kg) 175 lb 8 oz (79.6 kg)     Physical Exam   General: Well developed, well nourished, male appearing in no acute distress. Head: Normocephalic, atraumatic.  Neck: Supple without bruits, no JVD Lungs:  Resp regular and unlabored, CTA but scattered wheezes in bases. Heart: RRR, S1, S2, no S3, S4, or murmur; no rub. Abdomen: Soft, non-tender, non-distended with normoactive bowel sounds. No hepatomegaly. No rebound/guarding. No obvious abdominal masses. Extremities: No clubbing, cyanosis, no edema.  Distal pedal pulses are 2+ bilaterally. Neuro: Alert and oriented X 3. Moves all extremities spontaneously. Psych: Normal affect.  Labs    Chemistry Recent Labs Lab 09/05/16 0842 09/05/16 1928 09/05/16 2306 09/06/16 0345  NA 134* 134*  --  132*  K 4.0 4.3  --  3.7  CL 104 100*  --  102  CO2 18* 21*  --  20*  GLUCOSE 123* 133*  --  124*  BUN 15 8  --  9  CREATININE 1.22 1.09  --  0.93  CALCIUM 9.4 9.7  --  9.0  PROT 8.1 9.1* 8.9* 8.1  ALBUMIN 4.1 4.4 4.3 4.0  AST 64* 55* 57* 57*  ALT 80* 76* 72* 62  ALKPHOS 55 55 53 48  BILITOT 0.8 0.7 0.6 0.7  GFRNONAA >60 >60  --  >60  GFRAA >60 >60  --  >60  ANIONGAP 12 13  --  10     Hematology Recent Labs Lab 09/05/16 1928 09/06/16 0345 09/06/16 0700  WBC 6.4 8.4 9.0  RBC 4.64 4.40 4.45  HGB 15.5 14.5 14.7  HCT  43.9 41.4 41.6  MCV 94.6 94.1 93.5  MCH 33.4 33.0 33.0  MCHC 35.3 35.0 35.3  RDW 12.6 12.6 12.7  PLT 259 238 237    Cardiac Enzymes Recent Labs Lab 09/05/16 0842 09/05/16 2306 09/06/16 0345 09/06/16 1206  TROPONINI <0.03 1.22* 2.23* 3.42*    Recent Labs Lab 09/05/16 1950 09/05/16 2324  TROPIPOC 0.31* 0.97*     BNPNo results for input(s): BNP, PROBNP in the last 168 hours.   DDimer No results for input(s): DDIMER in the last 168 hours.   Radiology    Dg Chest 2 View  Result Date: 09/05/2016 CLINICAL DATA:  Right chest pain today with nausea and weakness. EXAM: CHEST  2 VIEW COMPARISON:  PA and lateral chest 07/02/2016 and 07/21/2015. FINDINGS: The chest is hyperexpanded with attenuation of the pulmonary vasculature. Lungs are clear. Heart size is normal. No pneumothorax or pleural fluid. No focal bony abnormality. IMPRESSION: No acute disease.  The lungs appear emphysematous. Electronically Signed   By: Inge Rise M.D.   On: 09/05/2016 09:03   US Abdomen Limited Ruq  Result Date: 09/06/2016 CLINICAL DATA:  Right upper quadrant pain for 2 days. EXAM: ULTRASOUND ABDOMEN LIMITED RIGHT UPPER  QUADRANT COMPARISON:  CT abdomen and pelvis dated July 31, 2007. FINDINGS: Gallbladder: No gallstones or wall thickening visualized. No sonographic Murphy sign noted by sonographer. Common bile duct: Diameter: 5 mm, normal. Liver: In the left lobe, there is a 1.5 x 1.0 x 1.1 cm echogenic lesion. Within normal limits in parenchymal echogenicity. Portal vein is patent on color Doppler imaging with normal direction of blood flow towards the liver. IMPRESSION: 1. 1.5 cm echogenic lesion within the left hepatic lobe, not clearly seen on prior CT. This could represent a hemangioma, however, given the patient's history of alcohol abuse, further evaluation with MRI with and without contrast is recommended. 2. Normal sonographic appearance of the gallbladder. Electronically Signed   By: Titus Dubin M.D.   On: 09/06/2016 08:51     Telemetry    NSR - Personally Reviewed  ECG    No new tracings - Personally Reviewed  Cardiac Studies   Left heart cath 09/06/16: 1. Mild nonobstructive CAD with no changes in coronary anatomy compared with recent cath 2. Moderately severe segmental LV systolic dysfunction with normal periapical wall motion and severe hypokinesis/akinesis of the basal LV segments, LVEF estimated at 35-40%  Suspect non-coronary cause of elevated troponin. Consider acute myocarditis or atypical stress cardiomyopathy (seems less likely).   Left heart cath 07/04/16:  Mid RCA lesion, 25 %stenosed.  Ramus lesion, 25 %stenosed.  Ost 2nd Diag to 2nd Diag lesion, 50 %stenosed.  Prox Cx lesion, 25 %stenosed.  The left ventricular ejection fraction is 50-55% by visual estimate.  The left ventricular systolic function is normal.  LV end diastolic pressure is normal.  Compared to prior ventriculogram in 2017, the base now contracts well and there is some anterolateral hypokinesis.   Continue medical therapy for BP control and treatment for cardiomyopathy.    Echocardiogram  04/27/16: Study Conclusions - Left ventricle: The cavity size was normal. Systolic function was   normal. The estimated ejection fraction was in the range of 50%   to 55%. Mild hypokinesis of the basal-midinferior and   inferoseptal myocardium. Doppler parameters are consistent with   abnormal left ventricular relaxation (grade 1 diastolic   dysfunction). - Aortic valve: Transvalvular velocity was within the normal range.   There was no stenosis. There was no regurgitation. -  Mitral valve: Transvalvular velocity was within the normal range.   There was no evidence for stenosis. There was no regurgitation. - Right ventricle: The cavity size was normal. Wall thickness was   normal. Systolic function was normal. - Tricuspid valve: There was trivial regurgitation.   Patient Profile     61 y.o. male hx of nonobstructive CAD by cath 07/2016 who is being seen today for the evaluation of chest pain and elevated troponin. He has a hx of hyperlipidemia, HTN, ETOH and tobacco abuse, QT prolongation and NSTEMI in 07/2015 at which time his trop peaked at 2. Cath at that time showed mild plaque and EF was 40-45% and it was felt that he had coronary vasospasm or Takotsubo variant and medical management was recommended.   Assessment & Plan    1. Chest pain, elevated troponin - troponin peaked at 3.42 - cardiac cath yesterday without change in coronary anatomy - suspect elevated troponin is noncardiac in nature - will obtain a D-dimer to rule out PE as a source of chest pain and elevated troponin - lipase and amylase are normal - do not suspect pancreatitis in an ETOH drinker as a source of chest pain, although AST and ALT slightly elevated - will discuss with attending cardiac MRI to rule out myocarditis as a source of his chest pain - D/C nitro drip and restart home imdur - consider increasing imdur to 30 mg for better pressure control   2. Reduced EF - per cath yesterday, LVEF was estimated at  35-40%, previously normal by echo 04/2016 and by cath 07/2016 - will repeat echo to determine if LVEF is indeed reduced this admission   3. HTN - better controlled today - D/C nitro drip - restart home imdur - titrate to 30 mg as pressure tolerates - continue home lisinopril   4. Prolonged QTc - repeat EKG pending   5. Current smoker - counseled on smoking cessation   Signed, Ledora Bottcher , PA-C 8:01 AM 09/07/2016 Pager: [phone]  Personally seen and examined. Agree with above.  Just got back from cardiac MRI.   Lung base clear, ambulation noted, alert, mid neck JVD Discussed case with Dr. Haroldine Laws. Could also be a form of hypertrophic cardiomyopathy   - LVEDP 22, nausea and elevated LFT's likely hepatic congestion from elevated right sided heart pressures.  - Lasix 40 IV BID  - KCL  - myocarditis possible with Trop 2. MRI would be helpful  - HIV, HEP negative. SED rate and ferritin pending.   - On ACE-I. On low dose Bb coreg 6.25 BID.  - No arrhythmias on tele.   Candee Furbish, MD

## 2016-09-07 NOTE — Progress Notes (Signed)
   Cardiac MRI  FINDINGS: The atria were normal sized. The RV was normal in size and function. There was no ASD/VSD or pericardial effusion. The mitral and aortic valve were normal Aortic root normal 30 mm. The LV was moderately dilated with diffuse hypokinesis and abnormal septal motion. There was some apical sparing. Delayed enhancement images were abnormal. There was fairly extensive mid myocardial uptake in the septum, apex and mid and basal anterior wall  IMPRESSION: 1) Moderate LVE with global hypokinesis, abnormal septal motion and some apical sparing EF 36%  2) Abnormal delayed gadolinium uptake in basal and mid anterior wall, septum and apex Mid myocardial uptake suggests myocarditis or infiltrative cardiomyopathy  3) Normal RV size and function  4) No pericardial effusion  Loanne Drilling, MD

## 2016-09-07 NOTE — Progress Notes (Signed)
PROGRESS NOTE    Andres Boyd  JXB:[id number] DOB: 06-20-1955 DOA: 09/05/2016 PCP: Patient, No Pcp Per   Outpatient Specialists:     Brief Narrative:  Andres Boyd is a 61 y.o. male with medical history significant for-  NSTEMI- 07/2015, QT prolongation tobacco abuse, alcohol abuse. Patient presented with complaints of chest pain- mostly center to right chest region, that started earlier this morning, that woke him up from sleep. Patient reported associated vomiting 5 episodes today, nonbloody. Patient also endorses some epigastric/right upper quadrant pain with chest pain.  Patient initially presented to the ED this a.m. Morning, troponin was negative, EKG was unchanged and with results of recent cardiac cath with nonobstructive disease July 2018 patient was sent home with return precautions. Patient presented later in the evening evening with persistent chest pain, and vomiting.  Cath showed: non-obstructive CAD and MRI of heart pending for pericarditis, etc.   Assessment & Plan:   Principal Problem:   Chest pain Active Problems:   Hypertension   Tobacco use disorder   Alcohol abuse   Prolonged Q-T interval on ECG   Chest pain-  -history of NSTEMI- 07/2016. Cardiac cath 07/04/2016- 25-50% nonobstructive disease.  - troponins elevated -cath 9/5: non-obstructive CAD - ASA/statin -? Myocarditis: MRI of heart pending, SED rate pending  Right upper quadrant/epigastric pain- with new transaminitis- AST- 55, ALT 76. the pattern of alcoholic Normal ALP  -U/S shows: 1.5 cm echogenic lesion within the left hepatic lobe, not clearly seen on prior CT. This could represent a hemangioma, however, given the patient's history of alcohol abuse, further evaluation with MRI with and without contrast is recommended-- WILL NEED THIS ARRANGED OUTPATIENT WITH FOLLOW UP - Acute Hepatitis panel negative  Alcohol abuse- last drink evening of 09/04/2016. Drinks 2-3 beers almost daily. Trying to cut down -  CIWA - Thiamine folate -? Gastritis- add carafate and protonix  Moderate Hemoglobinuria- new. 0-5 rbcs. 100 protein. - Possibly glomerular disease. - CK only mildly elevated, will need outpatient follow up -check G/C probe -HIV negative  Prolonged QTC- 498 - Avoid QT prolongation medications -recheck EKG pending on 9/6   DVT prophylaxis:  SCD's  Code Status: Full Code   Family Communication:   Disposition Plan:     Consultants:   cards   Subjective: Still with some stomach pain  Objective: Vitals:   09/06/16 2100 09/06/16 2356 09/07/16 0535 09/07/16 0557  BP: (!) 128/94 (!) 142/103 (!) 128/96   Pulse: 90 94 95   Resp: (!) 25 17 10 17   Temp: 99.2 F (37.3 C) 97.8 F (36.6 C) 98.5 F (36.9 C)   TempSrc: Oral Axillary Oral   SpO2: 96% 97% 100%   Weight:   79.6 kg (175 lb 8 oz)   Height:        Intake/Output Summary (Last 24 hours) at 09/07/16 1148 Last data filed at 09/07/16 0851  Gross per 24 hour  Intake           525.08 ml  Output              925 ml  Net          -399.92 ml   Filed Weights   09/06/16 0343 09/07/16 0535  Weight: 81.2 kg (179 lb) 79.6 kg (175 lb 8 oz)    Examination:  General exam: Appears calm and comfortable  Respiratory system: Clear to auscultation. Respiratory effort normal. Cardiovascular system: S1 & S2 heard, RRR. No JVD, murmurs, rubs, gallops or clicks.  No pedal edema. Gastrointestinal system: Abdomen is nondistended, soft and nontender. No organomegaly or masses felt. Normal bowel sounds heard. Central nervous system: Alert and oriented. No focal neurological deficits. Extremities: Symmetric 5 x 5 power. Skin: No rashes, lesions or ulcers Psychiatry: Judgement and insight appear normal. Mood & affect appropriate.     Data Reviewed: I have personally reviewed following labs and imaging studies  CBC:  Recent Labs Lab 09/05/16 0842 09/05/16 1928 09/06/16 0345 09/06/16 0700 09/07/16 0814  WBC 5.1 6.4 8.4  9.0 7.5  HGB 15.0 15.5 14.5 14.7 16.9  HCT 43.2 43.9 41.4 41.6 48.2  MCV 95.2 94.6 94.1 93.5 93.1  PLT 234 259 238 237 761   Basic Metabolic Panel:  Recent Labs Lab 09/05/16 0842 09/05/16 1928 09/06/16 0345 09/07/16 0814  NA 134* 134* 132*  --   K 4.0 4.3 3.7  --   CL 104 100* 102  --   CO2 18* 21* 20*  --   GLUCOSE 123* 133* 124*  --   BUN 15 8 9   --   CREATININE 1.22 1.09 0.93  --   CALCIUM 9.4 9.7 9.0  --   MG  --   --   --  2.3   GFR: Estimated Creatinine Clearance: 83.4 mL/min (by C-G formula based on SCr of 0.93 mg/dL). Liver Function Tests:  Recent Labs Lab 09/05/16 0842 09/05/16 1928 09/05/16 2306 09/06/16 0345  AST 64* 55* 57* 57*  ALT 80* 76* 72* 62  ALKPHOS 55 55 53 48  BILITOT 0.8 0.7 0.6 0.7  PROT 8.1 9.1* 8.9* 8.1  ALBUMIN 4.1 4.4 4.3 4.0    Recent Labs Lab 09/05/16 0952 09/05/16 1928 09/05/16 2306  LIPASE 26 22 25   AMYLASE  --   --  58   No results for input(s): AMMONIA in the last 168 hours. Coagulation Profile:  Recent Labs Lab 09/06/16 0804  INR 1.05   Cardiac Enzymes:  Recent Labs Lab 09/05/16 0842 09/05/16 2306 09/06/16 0345 09/06/16 0700 09/06/16 1206  CKTOTAL  --   --   --  455*  --   TROPONINI <0.03 1.22* 2.23*  --  3.42*   BNP (last 3 results) No results for input(s): PROBNP in the last 8760 hours. HbA1C: No results for input(s): HGBA1C in the last 72 hours. CBG: No results for input(s): GLUCAP in the last 168 hours. Lipid Profile: No results for input(s): CHOL, HDL, LDLCALC, TRIG, CHOLHDL, LDLDIRECT in the last 72 hours. Thyroid Function Tests: No results for input(s): TSH, T4TOTAL, FREET4, T3FREE, THYROIDAB in the last 72 hours. Anemia Panel: No results for input(s): VITAMINB12, FOLATE, FERRITIN, TIBC, IRON, RETICCTPCT in the last 72 hours. Urine analysis:    Component Value Date/Time   COLORURINE YELLOW 09/05/2016 1920   APPEARANCEUR CLEAR 09/05/2016 1920   LABSPEC 1.026 09/05/2016 1920   PHURINE 5.0  09/05/2016 1920   GLUCOSEU NEGATIVE 09/05/2016 1920   HGBUR MODERATE (A) 09/05/2016 1920   BILIRUBINUR NEGATIVE 09/05/2016 1920   KETONESUR 20 (A) 09/05/2016 1920   PROTEINUR 100 (A) 09/05/2016 1920   UROBILINOGEN 0.2 07/01/2007 0821   NITRITE NEGATIVE 09/05/2016 1920   LEUKOCYTESUR NEGATIVE 09/05/2016 1920     )No results found for this or any previous visit (from the past 240 hour(s)).    Anti-infectives    None       Radiology Studies: Mr Cardiac Morphology W Wo Contrast  Result Date: 09/07/2016 CLINICAL DATA:  Myocarditis EXAM: CARDIAC MRI TECHNIQUE: The patient was scanned  on a 1.5 Tesla GE magnet. A dedicated cardiac coil was used. Functional imaging was done using Fiesta sequences. 2,3, and 4 chamber views were done to assess for RWMA's. Modified Simpson's rule using a short axis stack was used to calculate an ejection fraction on a dedicated work Conservation officer, nature. The patient received 30 cc of Multihance. After 10 minutes inversion recovery sequences were used to assess for infiltration and scar tissue. CONTRAST:  30 cc Multihance FINDINGS: The atria were normal sized. The RV was normal in size and function. There was no ASD/VSD or pericardial effusion. The mitral and aortic valve were normal Aortic root normal 30 mm. The LV was moderately dilated with diffuse hypokinesis and abnormal septal motion. There was some apical sparing. Delayed enhancement images were abnormal. There was fairly extensive mid myocardial uptake in the septum, apex and mid and basal anterior wall IMPRESSION: 1) Moderate LVE with global hypokinesis, abnormal septal motion and some apical sparing EF 36% 2) Abnormal delayed gadolinium uptake in basal and mid anterior wall, septum and apex Mid myocardial uptake suggests myocarditis or infiltrative cardiomyopathy 3) Normal RV size and function 4) No pericardial effusion [place] Electronically Signed   By: Jenkins Rouge M.D.   On: 09/07/2016 11:25    US Abdomen Limited Ruq  Result Date: 09/06/2016 CLINICAL DATA:  Right upper quadrant pain for 2 days. EXAM: ULTRASOUND ABDOMEN LIMITED RIGHT UPPER QUADRANT COMPARISON:  CT abdomen and pelvis dated July 31, 2007. FINDINGS: Gallbladder: No gallstones or wall thickening visualized. No sonographic Murphy sign noted by sonographer. Common bile duct: Diameter: 5 mm, normal. Liver: In the left lobe, there is a 1.5 x 1.0 x 1.1 cm echogenic lesion. Within normal limits in parenchymal echogenicity. Portal vein is patent on color Doppler imaging with normal direction of blood flow towards the liver. IMPRESSION: 1. 1.5 cm echogenic lesion within the left hepatic lobe, not clearly seen on prior CT. This could represent a hemangioma, however, given the patient's history of alcohol abuse, further evaluation with MRI with and without contrast is recommended. 2. Normal sonographic appearance of the gallbladder. Electronically Signed   By: Titus Dubin M.D.   On: 09/06/2016 08:51        Scheduled Meds: . aspirin EC  81 mg Oral Daily  . atorvastatin  40 mg Oral q1800  . carvedilol  6.25 mg Oral BID WC  . folic acid  1 mg Oral Daily  . furosemide  40 mg Intravenous BID  . isosorbide mononitrate  15 mg Oral Daily  . lisinopril  5 mg Oral Daily  . multivitamin with minerals  1 tablet Oral Daily  . potassium chloride  20 mEq Oral BID  . sodium chloride flush  3 mL Intravenous Q12H  . sucralfate  1 g Oral TID WC & HS  . thiamine  100 mg Oral Daily   Or  . thiamine  100 mg Intravenous Daily   Continuous Infusions: . sodium chloride       LOS: 1 day    Time spent: 35 min    [place], DO [facility] Pager [phone]  If 7PM-7AM, please contact night-coverage [email or web address] Password [email or web address] 09/07/2016, 11:48 AM

## 2016-09-07 NOTE — Plan of Care (Signed)
Problem: Safety: Goal: Ability to remain free from injury will improve Outcome: Progressing Patient educated on method of reporting concerns and importance of utilizing call light to call for assistance when necessary  Problem: Health Behavior/Discharge Planning: Goal: Ability to manage health-related needs will improve Outcome: Progressing Needs reinforcement on importance of complying with prescribed therapeutic regimen  Problem: Pain Managment: Goal: General experience of comfort will improve Outcome: Not Progressing Frequently c/o nausea/vomiting poorly relieved by antiemetic medication   Problem: Physical Regulation: Goal: Ability to maintain clinical measurements within normal limits will improve Outcome: Completed/Met Date Met: 09/07/16 vss Goal: Will remain free from infection Outcome: Completed/Met Date Met: 09/07/16 Vss. Displays no signs or symptoms of infection. Educated on care of radial access site.

## 2016-09-07 NOTE — Progress Notes (Signed)
  Echocardiogram 2D Echocardiogram has been performed.  Andres Boyd Andres Boyd 09/07/2016, 1:01 PM

## 2016-09-08 ENCOUNTER — Inpatient Hospital Stay (HOSPITAL_COMMUNITY): Payer: Medicaid Other

## 2016-09-08 DIAGNOSIS — I5041 Acute combined systolic (congestive) and diastolic (congestive) heart failure: Secondary | ICD-10-CM

## 2016-09-08 DIAGNOSIS — R079 Chest pain, unspecified: Secondary | ICD-10-CM

## 2016-09-08 DIAGNOSIS — R1011 Right upper quadrant pain: Secondary | ICD-10-CM

## 2016-09-08 DIAGNOSIS — I400 Infective myocarditis: Principal | ICD-10-CM

## 2016-09-08 DIAGNOSIS — E871 Hypo-osmolality and hyponatremia: Secondary | ICD-10-CM

## 2016-09-08 DIAGNOSIS — I5021 Acute systolic (congestive) heart failure: Secondary | ICD-10-CM

## 2016-09-08 LAB — BASIC METABOLIC PANEL
ANION GAP: 10 (ref 5–15)
Anion gap: 11 (ref 5–15)
BUN: 16 mg/dL (ref 6–20)
BUN: 23 mg/dL — ABNORMAL HIGH (ref 6–20)
CHLORIDE: 93 mmol/L — AB (ref 101–111)
CO2: 24 mmol/L (ref 22–32)
CO2: 25 mmol/L (ref 22–32)
CREATININE: 1.38 mg/dL — AB (ref 0.61–1.24)
Calcium: 9.4 mg/dL (ref 8.9–10.3)
Calcium: 9.1 mg/dL (ref 8.9–10.3)
Chloride: 92 mmol/L — ABNORMAL LOW (ref 101–111)
Creatinine, Ser: 1.11 mg/dL (ref 0.61–1.24)
GFR calc Af Amer: >60 mL/min (ref >60)
GFR calc non Af Amer: >60 mL/min (ref >60)
GFR, EST NON AFRICAN AMERICAN: 54 mL/min — AB (ref >60)
GLUCOSE: 105 mg/dL — AB (ref 65–99)
Glucose, Bld: 112 mg/dL — ABNORMAL HIGH (ref 65–99)
POTASSIUM: 3.6 mmol/L (ref 3.5–5.1)
Potassium: 3.8 mmol/L (ref 3.5–5.1)
SODIUM: 128 mmol/L — AB (ref 135–145)
Sodium: 127 mmol/L — ABNORMAL LOW (ref 135–145)

## 2016-09-08 LAB — IRON AND TIBC
Iron: 74 ug/dL (ref 45–182)
SATURATION RATIOS: 29 % (ref 17.9–39.5)
TIBC: 258 ug/dL (ref 250–450)
UIBC: 184 ug/dL

## 2016-09-08 LAB — CBC
HEMATOCRIT: 51 % (ref 39.0–52.0)
HEMOGLOBIN: 18.3 g/dL — AB (ref 13.0–17.0)
MCH: 33.5 pg (ref 26.0–34.0)
MCHC: 35.9 g/dL (ref 30.0–36.0)
MCV: 93.4 fL (ref 78.0–100.0)
Platelets: 271 10*3/uL (ref 150–400)
RBC: 5.46 MIL/uL (ref 4.22–5.81)
RDW: 12.4 % (ref 11.5–15.5)
WBC: 6.6 10*3/uL (ref 4.0–10.5)

## 2016-09-08 MED ORDER — LOSARTAN POTASSIUM 25 MG PO TABS: 12.5000 mg | ORAL_TABLET | Freq: Every day | ORAL | Status: DC

## 2016-09-08 MED ORDER — SPIRONOLACTONE 25 MG PO TABS: 12.5000 mg | ORAL_TABLET | Freq: Every day | ORAL | Status: DC

## 2016-09-08 MED ORDER — SPIRONOLACTONE 25 MG PO TABS
12.5000 mg | ORAL_TABLET | Freq: Every day | ORAL | Status: DC
  Filled 2016-09-08: qty 1

## 2016-09-08 MED ORDER — FUROSEMIDE 40 MG PO TABS: 40.0000 mg | ORAL_TABLET | Freq: Every day | ORAL | Status: DC

## 2016-09-08 NOTE — Consult Note (Signed)
Advanced Heart Failure Team Consult Note  Primary Cardiologist:  Dr. Marlou Porch Primary HF: New (Dr. Haroldine Laws)  Reason for Consultation: Dr. Marlou Porch  HPI:    Andres Boyd is seen today for evaluation of Systolic HF at the request of Dr. Marlou Porch.   Andres Boyd is a 61 y.o. male with hx of non-obstructive CAD, HTN, ETOH and tobacco abuse, and HLD.   Pt previously admitted 07/03/2016 wth sharp chest pain. At this time was drinking > 8 ETOH beverages each night and attributed it to GERD. Trop peaked at 0.31. UDS + for marijuana and opioids. Felt to be related to ETOH intake, but Nuclear stress showed possible inferior ischemia. Cath showed moderated non-obstructive disease as below.   Pt presented to [facility] 09/05/16 with severe midsternal chest pain with radiation to right and left chest, right back, and abdomen. No SOB. Associated with diaphoresis and N/V. Given SL NTG x 3 with no relief. Troponin negative so sent home. He continued to worsen throughout the day, so returned to ED. Recheck Troponin 0.31. Cardiology consulted. Pertinent labs on admission include K 4.0, Creatinine 1.22, Troponin peaked at 3.42 on 9.5.18. D-dimer negative.   Repeat cath 09/06/16 as below with no change to CAD from 07/2016. Echo with significant drop in EF since July (50-55% -> 25-30%). cMRI with LGE uptake suggestive of myocarditis vs infiltrative CMP.   Today, he feels much better than admit. He states he was his USOH until this Tuesday. He was drinking heavily the night before, and woke up not feeling well. The CP was not related to exertion or relieved by rest. He denies SOB at baseline. No DOE with inclines, stairs, or long distances on flat ground. Occasional lightheadedness with rapid standing but not marked or limiting. No h/o orthopnea or bendopnea. Denies near syncope. He has used ETOH chronically and heavily. He continues to smoke 1/2 ppd.  He denies any history of heart disease in any first degree relatives. States he  had an uncle die of "heart problems" who also had ETOH problems.   He has diuresed 1.3 L and down 7 lbs from admit. Creatinine, K, and Mg stable.   Echo 09/07/16 LVEF 25-30%, diffuse HK. Mild MR.   cMRI 09/07/16 1) Moderate LVE with global hypokinesis, abnormal septal motion and some apical sparing EF 36% 2) Abnormal delayed gadolinium uptake in basal and mid anterior wall, septum and apex Mid myocardial uptake suggests myocarditis or infiltrative cardiomyopath 3) Normal RV size and function 4) No pericardial effusion  LHC 09/06/16 1. Mild nonobstructive CAD with no changes in coronary anatomy compared with recent cath 2. Moderately severe segmental LV systolic dysfunction with normal periapical wall motion and severe hypokinesis/akinesis of the basal LV segments, LVEF estimated at 35-40%  LHC 07/04/16  Mid RCA lesion, 25 %stenosed.  Ramus lesion, 25 %stenosed.  Ost 2nd Diag to 2nd Diag lesion, 50 %stenosed.  Prox Cx lesion, 25 %stenosed.  The left ventricular ejection fraction is 50-55% by visual estimate.  The left ventricular systolic function is normal.  LV end diastolic pressure is normal.  Compared to prior ventriculogram in 2017, the base now contracts well and there is some anterolateral hypokinesis.  Echo 04/2016 LVEF 50-55%, Grade 1 DD  Review of Systems: [y] = yes, [ ]  = no   General: Weight gain [ ] ; Weight loss [ ] ; Anorexia [ ] ; Fatigue [y]; Fever [ ] ; Chills [ ] ; Weakness [ ]   Cardiac: Chest pain/pressure [y]; Resting SOB [ ] ; Exertional  SOB [y]; Orthopnea [ ] ; Pedal Edema [ ] ; Palpitations [ ] ; Syncope [ ] ; Presyncope [ ] ; Paroxysmal nocturnal dyspnea[ ]   Pulmonary: Cough [ ] ; Wheezing[ ] ; Hemoptysis[ ] ; Sputum [ ] ; Snoring [ ]   GI: Vomiting[ ] ; Dysphagia[ ] ; Melena[ ] ; Hematochezia [ ] ; Heartburn[ ] ; Abdominal pain [y]; Constipation [ ] ; Diarrhea [ ] ; BRBPR [ ]   GU: Hematuria[ ] ; Dysuria [ ] ; Nocturia[ ]   Vascular: Pain in legs with walking [ ] ; Pain in feet with  lying flat [ ] ; Non-healing sores [ ] ; Stroke [ ] ; TIA [ ] ; Slurred speech [ ] ;  Neuro: Headaches[ ] ; Vertigo[ ] ; Seizures[ ] ; Paresthesias[ ] ;Blurred vision [ ] ; Diplopia [ ] ; Vision changes [ ]   Ortho/Skin: Arthritis [y]; Joint pain [y]; Muscle pain [ ] ; Joint swelling [ ] ; Back Pain [ ] ; Rash [ ]   Psych: Depression[ ] ; Anxiety[ ]   Heme: Bleeding problems [ ] ; Clotting disorders [ ] ; Anemia [ ]   Endocrine: Diabetes [ ] ; Thyroid dysfunction[ ]   Home Medications Prior to Admission medications   Medication Sig Start Date End Date Taking? Authorizing Provider  atorvastatin (LIPITOR) 40 MG tablet Take 1 tablet (40 mg total) by mouth daily at 6 PM. 08/26/15  Yes Imogene Burn, PA-C  isosorbide mononitrate (IMDUR) 30 MG 24 hr tablet Take 0.5 tablets (15 mg total) by mouth daily. 02/10/16  Yes Dorothy Spark, MD  lisinopril (PRINIVIL,ZESTRIL) 5 MG tablet Take 1 tablet (5 mg total) by mouth daily. 08/26/15  Yes Imogene Burn, PA-C  Multiple Vitamin (MULTIVITAMIN WITH MINERALS) TABS tablet Take 1 tablet by mouth daily.   Yes [provider]    Past Medical History: Past Medical History:  Diagnosis Date  . 1st degree AV block   . Coronary artery disease   . ETOH abuse   . Hyperlipidemia   . Hypertension   . Myocardial infarct ([place]) 07/2015  . Tobacco abuse     Past Surgical History: Past Surgical History:  Procedure Laterality Date  . CARDIAC CATHETERIZATION N/A 07/19/2015   Procedure: Left Heart Cath and Coronary Angiography;  Surgeon: Jettie Booze, MD;  Location: [place] [facility];  Service: Cardiovascular;  Laterality: N/A;  . LEFT HEART CATH AND CORONARY ANGIOGRAPHY N/A 07/04/2016   Procedure: Left Heart Cath and Coronary Angiography;  Surgeon: Jettie Booze, MD;  Location: [facility];  Service: Cardiovascular;  Laterality: N/A;  . LEFT HEART CATH AND CORONARY ANGIOGRAPHY N/A 09/06/2016   Procedure: LEFT HEART CATH AND CORONARY ANGIOGRAPHY;  Surgeon:  Sherren Mocha, MD;  Location: [facility];  Service: Cardiovascular;  Laterality: N/A;    Family History: Family History  Problem Relation Age of Onset  . Diabetes Mother   . Hypertension Mother   . Diabetes Father     Social History: Social History   Social History  . Marital status: Single    Spouse name: N/A  . Number of children: N/A  . Years of education: N/A   Social History Main Topics  . Smoking status: Current Every Day Smoker    Packs/day: 1.00    Years: 36.00    Types: Cigarettes  . Smokeless tobacco: Never Used  . Alcohol use 1.2 - 1.8 oz/week    2 - 3 Cans of beer per week     Comment: 2-3 32oz cans daily  . Drug use: Yes    Types: Marijuana  . Sexual activity: Not Asked   Other Topics Concern  . None  Social History Narrative   Patient unemployed. Lives in [place] with son.     Allergies:  No Known Allergies  Objective:    Vital Signs:   Temp:  [98.3 F (36.8 C)-99.3 F (37.4 C)] 98.3 F (36.8 C) (09/07 0523) Pulse Rate:  [83-102] 83 (09/07 0600) Resp:  [15-17] 17 (09/07 0523) BP: (102-135)/(73-93) 102/73 (09/07 0600) SpO2:  [99 %-100 %] 100 % (09/07 0059) Weight:  [172 lb 4.8 oz (78.2 kg)] 172 lb 4.8 oz (78.2 kg) (09/07 0523) Last BM Date: 09/07/16  Weight change: Filed Weights   09/06/16 0343 09/07/16 0535 09/08/16 0523  Weight: 179 lb (81.2 kg) 175 lb 8 oz (79.6 kg) 172 lb 4.8 oz (78.2 kg)    Intake/Output:   Intake/Output Summary (Last 24 hours) at 09/08/16 0743 Last data filed at 09/08/16 0649  Gross per 24 hour  Intake           1114.5 ml  Output             2275 ml  Net          -1160.5 ml      Physical Exam    General:  Elderly appearing. No resp difficulty HEENT: normal Neck: supple. JVP 7-8 cm. Carotids 2+ bilat; no bruits. No lymphadenopathy or thyromegaly appreciated. Cor: PMI nondisplaced. Regular. Slightly tachy. No rubs, gallops or murmurs. Lungs: clear Abdomen: soft, nontender, nondistended. No  hepatosplenomegaly. No bruits or masses. Good bowel sounds. Extremities: no cyanosis, clubbing, or rash. Trace ankle edema. Slightly cool.  Neuro: alert & orientedx3, cranial nerves grossly intact. moves all 4 extremities w/o difficulty. Affect pleasant   Telemetry   NSR 70s, Personally reviewed.   EKG    09/06/16 NSR 68, QRS 94. QTc 468. Personally reviewed.   Labs   Basic Metabolic Panel:  Recent Labs Lab 09/05/16 0842 09/05/16 1928 09/06/16 0345 09/07/16 0814 09/08/16 0343  NA 134* 134* 132*  --  128*  K 4.0 4.3 3.7  --  3.6  CL 104 100* 102  --  93*  CO2 18* 21* 20*  --  24  GLUCOSE 123* 133* 124*  --  112*  BUN 15 8 9   --  16  CREATININE 1.22 1.09 0.93  --  1.11  CALCIUM 9.4 9.7 9.0  --  9.4  MG  --   --   --  2.3  --     Liver Function Tests:  Recent Labs Lab 09/05/16 0842 09/05/16 1928 09/05/16 2306 09/06/16 0345  AST 64* 55* 57* 57*  ALT 80* 76* 72* 62  ALKPHOS 55 55 53 48  BILITOT 0.8 0.7 0.6 0.7  PROT 8.1 9.1* 8.9* 8.1  ALBUMIN 4.1 4.4 4.3 4.0    Recent Labs Lab 09/05/16 0952 09/05/16 1928 09/05/16 2306  LIPASE 26 22 25   AMYLASE  --   --  58   No results for input(s): AMMONIA in the last 168 hours.  CBC:  Recent Labs Lab 09/05/16 1928 09/06/16 0345 09/06/16 0700 09/07/16 0814 09/08/16 0343  WBC 6.4 8.4 9.0 7.5 6.6  HGB 15.5 14.5 14.7 16.9 18.3*  HCT 43.9 41.4 41.6 48.2 51.0  MCV 94.6 94.1 93.5 93.1 93.4  PLT 259 238 237 271 271    Cardiac Enzymes:  Recent Labs Lab 09/05/16 0842 09/05/16 2306 09/06/16 0345 09/06/16 0700 09/06/16 1206  CKTOTAL  --   --   --  455*  --   TROPONINI <0.03 1.22* 2.23*  --  3.42*  BNP: BNP (last 3 results) No results for input(s): BNP in the last 8760 hours.  ProBNP (last 3 results) No results for input(s): PROBNP in the last 8760 hours.   CBG: No results for input(s): GLUCAP in the last 168 hours.  Coagulation Studies:  Recent Labs  09/06/16 0804  LABPROT 13.6  INR 1.05      Imaging   Mr Cardiac Morphology W Wo Contrast  Result Date: 09/07/2016 CLINICAL DATA:  Myocarditis EXAM: CARDIAC MRI TECHNIQUE: The patient was scanned on a 1.5 Tesla GE magnet. A dedicated cardiac coil was used. Functional imaging was done using Fiesta sequences. 2,3, and 4 chamber views were done to assess for RWMA's. Modified Simpson's rule using a short axis stack was used to calculate an ejection fraction on a dedicated work Conservation officer, nature. The patient received 30 cc of Multihance. After 10 minutes inversion recovery sequences were used to assess for infiltration and scar tissue. CONTRAST:  30 cc Multihance FINDINGS: The atria were normal sized. The RV was normal in size and function. There was no ASD/VSD or pericardial effusion. The mitral and aortic valve were normal Aortic root normal 30 mm. The LV was moderately dilated with diffuse hypokinesis and abnormal septal motion. There was some apical sparing. Delayed enhancement images were abnormal. There was fairly extensive mid myocardial uptake in the septum, apex and mid and basal anterior wall IMPRESSION: 1) Moderate LVE with global hypokinesis, abnormal septal motion and some apical sparing EF 36% 2) Abnormal delayed gadolinium uptake in basal and mid anterior wall, septum and apex Mid myocardial uptake suggests myocarditis or infiltrative cardiomyopathy 3) Normal RV size and function 4) No pericardial effusion [place] Rouge Electronically Signed   By: Jenkins Rouge M.D.   On: 09/07/2016 11:25      Medications:     Current Medications: . aspirin EC  81 mg Oral Daily  . atorvastatin  40 mg Oral q1800  . carvedilol  6.25 mg Oral BID WC  . folic acid  1 mg Oral Daily  . furosemide  40 mg Intravenous BID  . isosorbide mononitrate  15 mg Oral Daily  . lisinopril  5 mg Oral Daily  . multivitamin with minerals  1 tablet Oral Daily  . pantoprazole  40 mg Oral Daily  . potassium chloride  20 mEq Oral BID  . sodium chloride  flush  3 mL Intravenous Q12H  . sucralfate  1 g Oral TID WC & HS  . thiamine  100 mg Oral Daily   Or  . thiamine  100 mg Intravenous Daily     Infusions: . sodium chloride         Patient Profile   Andres Boyd is a 61 y.o. male with hx of non-obstructive CAD, HTN, ETOH and tobacco abuse, and HLD.   Admitted 09/05/16 with chest pain and elevated troponin. Echo showed drop in EF from July. cMRI with LGE.   Assessment/Plan   1. Acute on chronic systolic CHF due to NICM - cMRI 09/07/16 LVEF 36%, with LGE in basal and mid anterior wall, septum and apex mid myocardial uptake suggests myocarditis or infiltrative cardiomyopathy. Consider PYP scan to look for amyloidosis/infiltrative disease. - NYHA class I-II at baseline. NYHA III on arrival. Improving daily.  - Volume status improving. Continue IV lasix 40 mg this am then likely transition to po. He was not on lasix at home PTA. Suspect lasix 40 mg daily would suffice with close HF follow up. - Continue coreg  6.25 mg BID - Continue lisinopril 5 mg daily. - Add spironolactone 12.5 mg daily. - Continue imdur 15 mg. Will add hydral as pressures permit - Iron studies normal. - HIV and Hep screens negative.  - Heavy ETOH use likely contributing factor.   2. Non-obstructive CAD - EF out of proportion to amount of CAD - Continue ASA and atorvastatin 40 mg daily.  - Meds as above.   3. HTN - Will adjust meds in setting of treating his HF  4. ETOH abuse - Strongly encouraged cessation - Atypical chest pain may have GI source. Would eventually benefit from EGD with significant ETOH abuse.   5. Tobacco abuse - Strongly encouraged cessation  6. Transaminitis - Very mild on admit and now improved. Likely 2/2 passive hepatic congestion.   Will continue adjusting meds and non-ischemic work up.  Will need close HF follow up and ETOH/Tobacco counseling. Consider paramedicine. Will have HF navigator see.   Length of Stay: 2  Annamaria Helling  09/08/2016, 7:43 AM  Advanced Heart Failure Team Pager [phone] (M-F; 7a - 4p)  Please contact [facility] for night-coverage after hours (4p -7a ) and weekends on [email or web address]  Patient seen and examined with the above-signed Advanced Practice Provider and/or Housestaff. I personally reviewed laboratory data, imaging studies and relevant notes. I independently examined the patient and formulated the important aspects of the plan. I have edited the note to reflect any of my changes or salient points. I have personally discussed the plan with the patient and/or family.  Echo and cMRI images reviewed personally. Case d/w Dr. Marlou Porch. Suspect her has subacute viral myocarditis. EF 36% by MRI. PYP scan without evidence of TTR amyloid.   On admit sounds to have had low output symptoms but now improved. Long discussion abotu the fact that ETOH can contribute to cardiomyopathy. Volume status improving. Will continue to titrate HF meds. Stop Imdur and lisinopril. Start losartan. Eventual Enresto.   Glori Bickers, MD  9:26 PM

## 2016-09-08 NOTE — Progress Notes (Signed)
PROGRESS NOTE    Mar Walmer  OZD:[id number] DOB: Sep 18, 1955 DOA: 09/05/2016 PCP: Patient, No Pcp Per   Brief Narrative:  Obe Ahlers is a 61 y.o. male with medical history significant for-  NSTEMI- 07/2015, QT prolongation tobacco abuse, alcohol abuse. Patient presented with complaints of chest pain- mostly center to right chest region, that started earlier this morning, that woke him up from sleep. Patient reported associated vomiting 5 episodes today, nonbloody. Patient also endorses some epigastric/right upper quadrant pain with chest pain.  Patient initially presented to the ED this a.m. Morning, troponin was negative, EKG was unchanged and with results of recent cardiac cath with nonobstructive disease July 2018 patient was sent home with return precautions. Patient presented later in the evening evening with persistent chest pain, and vomiting.  Cath showed: non-obstructive CAD and MRI of heart pending for pericarditis, etc.   Assessment & Plan:   Principal Problem:   Chest pain Active Problems:   Hypertension   Tobacco use disorder   Alcohol abuse   Prolonged Q-T interval on ECG   Chest pain-  -history of NSTEMI- 07/2016. Cardiac cath 07/04/2016- 25-50% nonobstructive disease.  - troponins elevated -cath 9/5: non-obstructive CAD - continue ASA/statin -? Myocarditis: ESR normal; MRI with concerns for infiltrative disease; most likely alcohol consumption contributing. -will follow cardiology rec's  Right upper quadrant/epigastric pain- with new transaminitis- normal alk phos -patient with hx of alcohol abuse; but not 2/1 pattern appreciated between AST/ALT -U/S shows: 1.5 cm echogenic lesion within the left hepatic lobe, not clearly seen on prior CT. This could represent a hemangioma, however, given the patient's history of alcohol abuse, further evaluation with MRI with and without contrast is recommended (-this can be done as an outpatient) -Acute Hepatitis panel  negative -potentially due to vascular congestion   Alcohol abuse- last drink evening of 09/04/2016. Drinks 2-3 beers almost daily. Trying to cut down -will continue CIWA -continue thiamine and folic acid  -since patient is complaining of abdominal discomfort and nausea, will continue PPI and carafate.  Moderate Hemoglobinuria- new. 0-5 rbcs. 100 protein. -Possibly glomerular disease. -CK only mildly elevated, will need outpatient follow up -HIV negative -follow GC/Chlamydia probe  Prolonged QTC- 498 -will continue avoiding drugs that can prolonged QT -continue monitoring on telemetry -follow and replete electrolytes as needed   Hyponatremia and elevated Cr -due to diuresis  -will discussed with cardiology   DVT prophylaxis:  SCD's  Code Status: Full Code   Family Communication: no family at bedside    Disposition Plan: to be determine. waiting on final inputs from heart failure team.   Consultants:   cardiology   Subjective: reporting some mid abdominal pain and intermittent nausea. No CP.    Objective: Vitals:   09/08/16 0900 09/08/16 1106 09/08/16 1610 09/08/16 1722  BP: (!) 69/46 (!) 74/52 (!) 86/66 100/66  Pulse:  81  70  Resp:  13 14   Temp:  98.7 F (37.1 C)    TempSrc:  Oral    SpO2:  100%    Weight:      Height:        Intake/Output Summary (Last 24 hours) at 09/08/16 1826 Last data filed at 09/08/16 1700  Gross per 24 hour  Intake              683 ml  Output             1200 ml  Net             -  517 ml   Filed Weights   09/06/16 0343 09/07/16 0535 09/08/16 0523  Weight: 81.2 kg (179 lb) 79.6 kg (175 lb 8 oz) 78.2 kg (172 lb 4.8 oz)    Examination: General exam: patient reports some nausea and mild mid abdominal pain. No CP and no SOB.   Respiratory system: good air movement, scattered rhonchi, no wheezing, no frank crackles. Cardiovascular system: S1 and S2, RRR, no JVD, no rubs, no gallops. Gastrointestinal system: no distension,  positive BS, soft to palpation; mild tenderness on his mid abd.  Central nervous system: AAOX3, no focal motor or neurologic deficit, CN 2-12 intact.  Extremities: no edema, no cyanosis  Psychiatry: Judgement and insight appear normal. Mood & affect appropriate.    Data Reviewed: I have personally reviewed following labs and imaging studies  CBC:  Recent Labs Lab 09/05/16 1928 09/06/16 0345 09/06/16 0700 09/07/16 0814 09/08/16 0343  WBC 6.4 8.4 9.0 7.5 6.6  HGB 15.5 14.5 14.7 16.9 18.3*  HCT 43.9 41.4 41.6 48.2 51.0  MCV 94.6 94.1 93.5 93.1 93.4  PLT 259 238 237 271 481   Basic Metabolic Panel:  Recent Labs Lab 09/05/16 0842 09/05/16 1928 09/06/16 0345 09/07/16 0814 09/08/16 0343 09/08/16 1653  NA 134* 134* 132*  --  128* 127*  K 4.0 4.3 3.7  --  3.6 3.8  CL 104 100* 102  --  93* 92*  CO2 18* 21* 20*  --  24 25  GLUCOSE 123* 133* 124*  --  112* 105*  BUN '15 8 9  '$ --  16 23*  CREATININE 1.22 1.09 0.93  --  1.11 1.38*  CALCIUM 9.4 9.7 9.0  --  9.4 9.1  MG  --   --   --  2.3  --   --    GFR: Estimated Creatinine Clearance: 56.2 mL/min (A) (by C-G formula based on SCr of 1.38 mg/dL (H)).   Liver Function Tests:  Recent Labs Lab 09/05/16 0842 09/05/16 1928 09/05/16 2306 09/06/16 0345  AST 64* 55* 57* 57*  ALT 80* 76* 72* 62  ALKPHOS 55 55 53 48  BILITOT 0.8 0.7 0.6 0.7  PROT 8.1 9.1* 8.9* 8.1  ALBUMIN 4.1 4.4 4.3 4.0    Recent Labs Lab 09/05/16 0952 09/05/16 1928 09/05/16 2306  LIPASE '26 22 25  '$ AMYLASE  --   --  58   Coagulation Profile:  Recent Labs Lab 09/06/16 0804  INR 1.05   Cardiac Enzymes:  Recent Labs Lab 09/05/16 0842 09/05/16 2306 09/06/16 0345 09/06/16 0700 09/06/16 1206  CKTOTAL  --   --   --  455*  --   TROPONINI <0.03 1.22* 2.23*  --  3.42*   Anemia Panel:  Recent Labs  09/07/16 1059 09/08/16 0343  FERRITIN 286  --   TIBC  --  258  IRON  --  74   Urine analysis:    Component Value Date/Time   COLORURINE  YELLOW 09/05/2016 1920   APPEARANCEUR CLEAR 09/05/2016 1920   LABSPEC 1.026 09/05/2016 1920   PHURINE 5.0 09/05/2016 1920   GLUCOSEU NEGATIVE 09/05/2016 1920   HGBUR MODERATE (A) 09/05/2016 1920   BILIRUBINUR NEGATIVE 09/05/2016 1920   KETONESUR 20 (A) 09/05/2016 1920   PROTEINUR 100 (A) 09/05/2016 1920   UROBILINOGEN 0.2 07/01/2007 0821   NITRITE NEGATIVE 09/05/2016 1920   LEUKOCYTESUR NEGATIVE 09/05/2016 1920     )No results found for this or any previous visit (from the past 240 hour(s)).    Anti-infectives  None      Radiology Studies: Nm Tumor Localization W Spect  Result Date: 09/08/2016 CLINICAL DATA:  HEART FAILURE. CONCERN FOR CARDIAC AMYLOIDOSIS. Chest pain. EXAM: NUCLEAR MEDICINE TUMOR LOCALIZATION. PYP CARDIAC AMYLOIDOSIS SCAN WITH SPECT TECHNIQUE: Following intravenous administration of radiopharmaceutical, anterior planar images of the chest were obtained. Regions of interest were placed on the heart and contralateral chest wall for quantitative assessment. Additional SPECT imaging of the chest was obtained. RADIOPHARMACEUTICALS:  20.4 mCi TECHNETIUM 99 PYROPHOSPHATE FINDINGS: Planar Visual assessment: Anterior planar imaging demonstrates radiotracer uptake within the heart equal to uptake within the adjacent ribs (Grade 1/ 2). Quantitative assessment : Quantitative assessment of the cardiac uptake compared to the contralateral chest wall is equal to 1.1 (H/CL = 1.1) . SPECT assessment: SPECT imaging of the chest demonstrates mild radiotracer accumulation within the LEFT ventricle equal to or slightly less than accumulation within ribs. IMPRESSION: Strict categorical analysis of visual and quantitative assessment (grade 1/ 2 and H/CLL equal 1.1) are equivocal for transthyretin amyloidosis. However, assessments are on the lower end of the range of the equivocal category and therefore favor non involvement by transthyretin amyloidosis Electronically Signed   By: Suzy Bouchard M.D.   On: 09/08/2016 15:42   Mr Cardiac Morphology W Wo Contrast  Result Date: 09/07/2016 CLINICAL DATA:  Myocarditis EXAM: CARDIAC MRI TECHNIQUE: The patient was scanned on a 1.5 Tesla GE magnet. A dedicated cardiac coil was used. Functional imaging was done using Fiesta sequences. 2,3, and 4 chamber views were done to assess for RWMA's. Modified Simpson's rule using a short axis stack was used to calculate an ejection fraction on a dedicated work Conservation officer, nature. The patient received 30 cc of Multihance. After 10 minutes inversion recovery sequences were used to assess for infiltration and scar tissue. CONTRAST:  30 cc Multihance FINDINGS: The atria were normal sized. The RV was normal in size and function. There was no ASD/VSD or pericardial effusion. The mitral and aortic valve were normal Aortic root normal 30 mm. The LV was moderately dilated with diffuse hypokinesis and abnormal septal motion. There was some apical sparing. Delayed enhancement images were abnormal. There was fairly extensive mid myocardial uptake in the septum, apex and mid and basal anterior wall IMPRESSION: 1) Moderate LVE with global hypokinesis, abnormal septal motion and some apical sparing EF 36% 2) Abnormal delayed gadolinium uptake in basal and mid anterior wall, septum and apex Mid myocardial uptake suggests myocarditis or infiltrative cardiomyopathy 3) Normal RV size and function 4) No pericardial effusion [place] Electronically Signed   By: Jenkins Rouge M.D.   On: 09/07/2016 11:25    Scheduled Meds: . aspirin EC  81 mg Oral Daily  . atorvastatin  40 mg Oral q1800  . carvedilol  6.25 mg Oral BID WC  . folic acid  1 mg Oral Daily  . [START ON 09/09/2016] furosemide  40 mg Oral Daily  . isosorbide mononitrate  15 mg Oral Daily  . losartan  12.5 mg Oral Daily  . multivitamin with minerals  1 tablet Oral Daily  . pantoprazole  40 mg Oral Daily  . potassium chloride  20 mEq Oral BID  . sodium  chloride flush  3 mL Intravenous Q12H  . spironolactone  12.5 mg Oral Daily  . sucralfate  1 g Oral TID WC & HS  . thiamine  100 mg Oral Daily   Continuous Infusions: . sodium chloride       LOS: 2 days  Time spent: 35 min    Barton Dubois, MD [facility] Pager [phone]  If 7PM-7AM, please contact night-coverage [email or web address] Password [facility] 09/08/2016, 6:26 PM

## 2016-09-08 NOTE — Progress Notes (Addendum)
Heart Failure Navigator Consult Note  Presentation: Per Joesph July PA-C Jacquis Paxton is a 61 y.o. male with hx of non-obstructive CAD, HTN, ETOH and tobacco abuse, and HLD.   Pt previously admitted 07/03/2016 wth sharp chest pain. At this time was drinking > 8 ETOH beverages each night and attributed it to GERD. Trop peaked at 0.31. UDS + for marijuana and opioids. Felt to be related to ETOH intake, but Nuclear stress showed possible inferior ischemia. Cath showed moderated non-obstructive disease as below.   Pt [facility] to [facility]16 with severe midsternal chest pain with radiation to right and left chest, right back, and abdomen. No SOB. Associated with diaphoresis and N/V. Given SL NTG x 3 with no relief. Troponin negative so sent home. He continued to worsen throughout the day, so returned to ED. Recheck Troponin 0.31. Cardiology consulted. Pertinent labs on admission include K 4.0, Creatinine 1.22, Troponin peaked at 3.42 on 9.5.18. D-dimer negative.   Repeat cath 09/06/16 as below with no change to CAD from 07/2016. Echo with significant drop in EF since July (50-55% -> 25-30%). cMRI with LGE uptake suggestive of myocarditis vs infiltrative CMP.   Past Medical History:  Diagnosis Date  . 1st degree AV block   . Coronary artery disease   . ETOH abuse   . Hyperlipidemia   . Hypertension   . Myocardial infarct ([place]) 07/2015  . Tobacco abuse     Social History   Social History  . Marital status: Single    Spouse name: N/A  . Number of children: N/A  . Years of education: N/A   Social History Main Topics  . Smoking status: Current Every Day Smoker    Packs/day: 1.00    Years: 36.00    Types: Cigarettes  . Smokeless tobacco: Never Used  . Alcohol use 1.2 - 1.8 oz/week    2 - 3 Cans of beer per week     Comment: 2-3 32oz cans daily  . Drug use: Yes    Types: Marijuana  . Sexual activity: Not Asked   Other Topics Concern  . None   Social History Narrative   Patient  unemployed. Lives in [place] with son.     ECHO:Study Conclusions--09/07/16  - Left ventricle: LVEF is severely depressed with diffuse   hypokinesis, basal inferior/inferoseptal akinesis. The cavity   size was mildly dilated. Wall thickness was normal. Systolic   function was severely reduced. The estimated ejection fraction   was in the range of 25% to 30%. - Mitral valve: There was mild regurgitation.  Impressions:  - Compared to report of April 2018, LVEF is now depressed.  ------------------------------------------------------------------- Study data:  Comparison was made to the study of 04/27/2016.  Study status:  Routine.  Procedure:  Transthoracic echocardiography. Image quality was adequate.  Study completion:  There were no complications.          Transthoracic echocardiography.  M-mode, complete 2D, spectral Doppler, and color Doppler.  Birthdate: Patient birthdate: [birth year].  Age:  Patient is 61 yr old.  Sex: Gender: male.    BMI: 25.9 kg/m^2.  Patient status:  Inpatient. Study date:  Study date: 09/07/2016. Study time: 12:24 PM. Location:  Bedside.   BNP No results found for: BNP  ProBNP No results found for: PROBNP   Education Assessment and Provision:  Detailed education and instructions provided on heart failure disease management including the following:  Signs and symptoms of Heart Failure When to call the physician Importance of daily weights  Low sodium diet Fluid restriction Medication management Anticipated future follow-up appointments  Patient education given on each of the above topics.  Patient acknowledges understanding and acceptance of all instructions.  I spoke with Mr. Pieroni regarding his current hospitalization and HF diagnosis.  He tells me that he lives in [place] with his son.  He does not have a scale and I will provide one for home use.  I reviewed the importance of daily weights and when to contact the physician.  He admits that  he was eating a great deal of high sodium foods prior to admission-- chinese and hotdogs.  We discussed a low sodium diet and high sodium foods to avoid.  He denies any issues getting his medications at discharge and he has Medicaid.  He will follow in the [facility] Clinic after discharge.   Education Materials:  "Living Better With Heart Failure" Booklet, Daily Weight Tracker Tool    High Risk Criteria for Readmission and/or Poor Patient Outcomes:  (Recommend Follow-up with Advanced [facility] Failure Clinic)   EF <30%- yes 25-30%  2 or more admissions in 6 months- 2 / 75mo  Difficult social situation- yes ? ongoing ETOH abuse-referred to HF Clinic SW for supportive services  Demonstrates medication noncompliance- ? Denies   Barriers of Care:  ETOH Use/ Abuse, Knowledge and compliance  Discharge Planning:   Plans to return to home with son.  He will benefit from ongoing education, symptom recognition and compliance reinforcement through [place].  He will also be followed through [facility] program.

## 2016-09-09 ENCOUNTER — Encounter (HOSPITAL_COMMUNITY): Payer: Medicaid Other

## 2016-09-09 DIAGNOSIS — I9589 Other hypotension: Secondary | ICD-10-CM

## 2016-09-09 DIAGNOSIS — N179 Acute kidney failure, unspecified: Secondary | ICD-10-CM

## 2016-09-09 DIAGNOSIS — I959 Hypotension, unspecified: Secondary | ICD-10-CM

## 2016-09-09 DIAGNOSIS — F17200 Nicotine dependence, unspecified, uncomplicated: Secondary | ICD-10-CM

## 2016-09-09 LAB — BASIC METABOLIC PANEL WITH GFR
Anion gap: 10 (ref 5–15)
BUN: 35 mg/dL — ABNORMAL HIGH (ref 6–20)
CO2: 24 mmol/L (ref 22–32)
Calcium: 8.8 mg/dL — ABNORMAL LOW (ref 8.9–10.3)
Chloride: 92 mmol/L — ABNORMAL LOW (ref 101–111)
Creatinine, Ser: 2.01 mg/dL — ABNORMAL HIGH (ref 0.61–1.24)
GFR calc Af Amer: 39 mL/min — ABNORMAL LOW
GFR calc non Af Amer: 34 mL/min — ABNORMAL LOW
Glucose, Bld: 95 mg/dL (ref 65–99)
Potassium: 3.8 mmol/L (ref 3.5–5.1)
Sodium: 126 mmol/L — ABNORMAL LOW (ref 135–145)

## 2016-09-09 MED ORDER — FUROSEMIDE 20 MG PO TABS
20.0000 mg | ORAL_TABLET | Freq: Every day | ORAL | Status: DC
  Administered 2016-09-09: 20 mg via ORAL
  Filled 2016-09-09: qty 1

## 2016-09-09 MED ORDER — SODIUM CHLORIDE 0.9 % IV BOLUS (SEPSIS)
500.0000 mL | Freq: Once | INTRAVENOUS | Status: AC
  Administered 2016-09-09: 500 mL via INTRAVENOUS

## 2016-09-09 NOTE — Progress Notes (Signed)
Patient's BP remained low throughout the night with systolic in the 26'Z and 12'W. Patient is asymptomatic. On call Md Dr. Kennon Holter made aware. No new orders.  Will continue to monitor.

## 2016-09-09 NOTE — Progress Notes (Signed)
PROGRESS NOTE    [name]  [name]:[id number] DOB: December 31, 1955 DOA: 09/05/2016 PCP: Patient, No Pcp Per   Brief Narrative:  Andres Boyd is a 61 y.o. male with medical history significant for-  NSTEMI- 07/2015, QT prolongation tobacco abuse, alcohol abuse. Patient presented with complaints of chest pain- mostly center to right chest region, that started earlier this morning, that woke him up from sleep. Patient reported associated vomiting 5 episodes today, nonbloody. Patient also endorses some epigastric/right upper quadrant pain with chest pain.  Patient initially presented to the ED this a.m. Morning, troponin was negative, EKG was unchanged and with results of recent cardiac cath with nonobstructive disease July 2018 patient was sent home with return precautions. Patient presented later in the evening evening with persistent chest pain, and vomiting.  Cath showed: non-obstructive CAD and MRI of heart pending for pericarditis, etc.   Assessment & Plan:   Principal Problem:   Chest pain Active Problems:   Hypertension   Tobacco use disorder   Alcohol abuse   Prolonged Q-T interval on ECG   Chest pain-  -history of NSTEMI- 07/2016. Cardiac cath 07/04/2016- 25-50% nonobstructive disease.  - troponins elevated -cath 9/5: non-obstructive CAD - continue ASA/statin -? Viral Myocarditis: ESR normal; MRI with concerns for infiltrative disease; most likely alcohol consumption contributing. -will follow cardiology rec's -currently will hold all HF meds, due to AKI and hypotension   Right upper quadrant/epigastric pain- with new transaminitis- normal alk phos -patient with hx of alcohol abuse; but not 2/1 pattern appreciated between AST/ALT -U/S shows: 1.5 cm echogenic lesion within the left hepatic lobe, not clearly seen on prior CT. This could represent a hemangioma, however, given the patient's history of alcohol abuse, further evaluation with MRI with and without contrast is recommended (-this  can be done as an outpatient) -Acute Hepatitis panel negative -potentially due to vascular congestion   Alcohol abuse- last drink evening of 09/04/2016. Drinks 2-3 beers almost daily. Trying to cut down -will continue CIWA -continue thiamine and folic acid  -since patient is complaining of abdominal discomfort and nausea, will continue PPI and carafate.  Moderate Hemoglobinuria- new. 0-5 rbcs. 100 protein. -Possibly some component of glomerular disease. -CK only mildly elevated, will need outpatient follow up -HIV negative -follow GC/Chlamydia probe -will give some IVF's now  Prolonged QTC- 498 -will continue avoiding drugs that can prolonged QT -continue monitoring on telemetry -follow and replete electrolytes as needed   Hyponatremia and elevated Cr -due to diuresis  -discussed with cardiology and will hold HF meds for now -give some IVF's back and follow renal function trend   Acute on chronic systolic CHD: NICM -patient dry now -will hold all HF meds as recommended by cardiology -give some IVF's back and follow renal function and electrolytes -slowly resume discharge drugs -encourage for strict abstinence   DVT prophylaxis:  SCD's  Code Status: Full Code   Family Communication: no family at bedside    Disposition Plan: to be determine. Now with AKI and hyponatremia from over diuresis. Will hold HF meds and give some IVF's.   Consultants:   cardiology   Subjective: No SOB, no CP. Patient denies abd pain. Report feeling dizzy and lightheaded.   Objective: Vitals:   09/09/16 1200 09/09/16 1408 09/09/16 1615 09/09/16 1754  BP: (!) 88/51 (!) 134/119 (!) 88/46 (!) 90/50  Pulse:  65    Resp:   15   Temp:  97.8 F (36.6 C)    TempSrc:  Oral  SpO2:  100%    Weight:      Height:        Intake/Output Summary (Last 24 hours) at 09/09/16 1821 Last data filed at 09/09/16 1753  Gross per 24 hour  Intake              240 ml  Output             1700 ml    Net            -1460 ml   Filed Weights   09/07/16 0535 09/08/16 0523 09/09/16 0500  Weight: 79.6 kg (175 lb 8 oz) 78.2 kg (172 lb 4.8 oz) 78.4 kg (172 lb 12.8 oz)    Examination: General exam: patient feeling dizzy and lightheaded. Low BP on VS. No CP, no SOB and reported no abd pain today.    Respiratory system: CTA bilaterally, no wheezing, no crackles, normal resp effort.  Cardiovascular system: S1 and S2, no JVD, no rubs, no gallops. Gastrointestinal system: no distension, no abd pain, positive BS, positive BS.   Central nervous system: AAOX3, no focal motor or neurologic deficit; CN 2-12 intact.   Extremities: no edema, no cyanosis  Psychiatry: judgement and insight appear normal; no SI and no hallucinations.   Data Reviewed: I have personally reviewed following labs and imaging studies  CBC:  Recent Labs Lab 09/05/16 1928 09/06/16 0345 09/06/16 0700 09/07/16 0814 09/08/16 0343  WBC 6.4 8.4 9.0 7.5 6.6  HGB 15.5 14.5 14.7 16.9 18.3*  HCT 43.9 41.4 41.6 48.2 51.0  MCV 94.6 94.1 93.5 93.1 93.4  PLT 259 238 237 271 038   Basic Metabolic Panel:  Recent Labs Lab 09/05/16 1928 09/06/16 0345 09/07/16 0814 09/08/16 0343 09/08/16 1653 09/09/16 0239  NA 134* 132*  --  128* 127* 126*  K 4.3 3.7  --  3.6 3.8 3.8  CL 100* 102  --  93* 92* 92*  CO2 21* 20*  --  '24 25 24  '$ GLUCOSE 133* 124*  --  112* 105* 95  BUN 8 9  --  16 23* 35*  CREATININE 1.09 0.93  --  1.11 1.38* 2.01*  CALCIUM 9.7 9.0  --  9.4 9.1 8.8*  MG  --   --  2.3  --   --   --    GFR: Estimated Creatinine Clearance: 38.6 mL/min (A) (by C-G formula based on SCr of 2.01 mg/dL (H)).   Liver Function Tests:  Recent Labs Lab 09/05/16 0842 09/05/16 1928 09/05/16 2306 09/06/16 0345  AST 64* 55* 57* 57*  ALT 80* 76* 72* 62  ALKPHOS 55 55 53 48  BILITOT 0.8 0.7 0.6 0.7  PROT 8.1 9.1* 8.9* 8.1  ALBUMIN 4.1 4.4 4.3 4.0    Recent Labs Lab 09/05/16 0952 09/05/16 1928 09/05/16 2306  LIPASE '26 22  25  '$ AMYLASE  --   --  58   Coagulation Profile:  Recent Labs Lab 09/06/16 0804  INR 1.05   Cardiac Enzymes:  Recent Labs Lab 09/05/16 0842 09/05/16 2306 09/06/16 0345 09/06/16 0700 09/06/16 1206  CKTOTAL  --   --   --  455*  --   TROPONINI <0.03 1.22* 2.23*  --  3.42*   Anemia Panel:  Recent Labs  09/07/16 1059 09/08/16 0343  FERRITIN 286  --   TIBC  --  258  IRON  --  74   Urine analysis:    Component Value Date/Time   COLORURINE YELLOW 09/05/2016 1920  APPEARANCEUR CLEAR 09/05/2016 1920   LABSPEC 1.026 09/05/2016 1920   PHURINE 5.0 09/05/2016 1920   GLUCOSEU NEGATIVE 09/05/2016 1920   HGBUR MODERATE (A) 09/05/2016 1920   BILIRUBINUR NEGATIVE 09/05/2016 1920   KETONESUR 20 (A) 09/05/2016 1920   PROTEINUR 100 (A) 09/05/2016 1920   UROBILINOGEN 0.2 07/01/2007 0821   NITRITE NEGATIVE 09/05/2016 1920   LEUKOCYTESUR NEGATIVE 09/05/2016 1920    )No results found for this or any previous visit (from the past 240 hour(s)).    Anti-infectives    None      Radiology Studies: Nm Tumor Localization W Spect  Result Date: 09/08/2016 CLINICAL DATA:  HEART FAILURE. CONCERN FOR CARDIAC AMYLOIDOSIS. Chest pain. EXAM: NUCLEAR MEDICINE TUMOR LOCALIZATION. PYP CARDIAC AMYLOIDOSIS SCAN WITH SPECT TECHNIQUE: Following intravenous administration of radiopharmaceutical, anterior planar images of the chest were obtained. Regions of interest were placed on the heart and contralateral chest wall for quantitative assessment. Additional SPECT imaging of the chest was obtained. RADIOPHARMACEUTICALS:  20.4 mCi TECHNETIUM 99 PYROPHOSPHATE FINDINGS: Planar Visual assessment: Anterior planar imaging demonstrates radiotracer uptake within the heart equal to uptake within the adjacent ribs (Grade 1/ 2). Quantitative assessment : Quantitative assessment of the cardiac uptake compared to the contralateral chest wall is equal to 1.1 (H/CL = 1.1) . SPECT assessment: SPECT imaging of the chest  demonstrates mild radiotracer accumulation within the LEFT ventricle equal to or slightly less than accumulation within ribs. IMPRESSION: Strict categorical analysis of visual and quantitative assessment (grade 1/ 2 and H/CLL equal 1.1) are equivocal for transthyretin amyloidosis. However, assessments are on the lower end of the range of the equivocal category and therefore favor non involvement by transthyretin amyloidosis Electronically Signed   By: Suzy Bouchard M.D.   On: 09/08/2016 15:42    Scheduled Meds: . aspirin EC  81 mg Oral Daily  . atorvastatin  40 mg Oral q1800  . folic acid  1 mg Oral Daily  . multivitamin with minerals  1 tablet Oral Daily  . pantoprazole  40 mg Oral Daily  . sodium chloride flush  3 mL Intravenous Q12H  . sucralfate  1 g Oral TID WC & HS  . thiamine  100 mg Oral Daily   Continuous Infusions: . sodium chloride       LOS: 3 days    Time spent: 35 min    Barton Dubois, MD [facility] Pager [phone]  If 7PM-7AM, please contact night-coverage [email or web address] Password [facility] 09/09/2016, 6:21 PM

## 2016-09-09 NOTE — Progress Notes (Signed)
Advanced Heart Failure Rounding Note   Subjective:    Felt dizzy this BP in 60s. Received 500 cc NS. Feels better and wants to go home and go fishing but BP still 70 (checked personally with manual cuff). No SOB.  Creatinine up 1.4-> 2.1    Objective:   Weight Range:  Vital Signs:   Temp:  [97.6 F (36.4 C)-98.9 F (37.2 C)] 97.6 F (36.4 C) (09/08 0500) Pulse Rate:  [61-70] 61 (09/08 0500) Resp:  [11-16] 11 (09/08 1057) BP: (68-108)/(38-93) 108/93 (09/08 1057) SpO2:  [96 %-100 %] 100 % (09/08 0500) Weight:  [78.4 kg (172 lb 12.8 oz)] 78.4 kg (172 lb 12.8 oz) (09/08 0500) Last BM Date: 09/07/16  Weight change: Filed Weights   09/07/16 0535 09/08/16 0523 09/09/16 0500  Weight: 79.6 kg (175 lb 8 oz) 78.2 kg (172 lb 4.8 oz) 78.4 kg (172 lb 12.8 oz)    Intake/Output:   Intake/Output Summary (Last 24 hours) at 09/09/16 1136 Last data filed at 09/09/16 1006  Gross per 24 hour  Intake              600 ml  Output                0 ml  Net              600 ml     Physical Exam: General:  Sitting up in bed . No resp difficulty HEENT: normal Neck: supple. no JVD. Carotids 2+ bilat; no bruits. No lymphadenopathy or thryomegaly appreciated. Cor: PMI nondisplaced. Regular rate & rhythm. No rubs, gallops or murmurs. Lungs: clear Abdomen: soft, nontender, nondistended. No hepatosplenomegaly. No bruits or masses. Good bowel sounds. Extremities: no cyanosis, clubbing, rash, edema Neuro: alert & orientedx3, cranial nerves grossly intact. moves all 4 extremities w/o difficulty. Affect pleasant   Telemetry: NSR 70s Personally reviewed   Labs: Basic Metabolic Panel:  Recent Labs Lab 09/05/16 1928 09/06/16 0345 09/07/16 0814 09/08/16 0343 09/08/16 1653 09/09/16 0239  NA 134* 132*  --  128* 127* 126*  K 4.3 3.7  --  3.6 3.8 3.8  CL 100* 102  --  93* 92* 92*  CO2 21* 20*  --  24 25 24   GLUCOSE 133* 124*  --  112* 105* 95  BUN 8 9  --  16 23* 35*  CREATININE 1.09  0.93  --  1.11 1.38* 2.01*  CALCIUM 9.7 9.0  --  9.4 9.1 8.8*  MG  --   --  2.3  --   --   --     Liver Function Tests:  Recent Labs Lab 09/05/16 0842 09/05/16 1928 09/05/16 2306 09/06/16 0345  AST 64* 55* 57* 57*  ALT 80* 76* 72* 62  ALKPHOS 55 55 53 48  BILITOT 0.8 0.7 0.6 0.7  PROT 8.1 9.1* 8.9* 8.1  ALBUMIN 4.1 4.4 4.3 4.0    Recent Labs Lab 09/05/16 0952 09/05/16 1928 09/05/16 2306  LIPASE 26 22 25   AMYLASE  --   --  58   No results for input(s): AMMONIA in the last 168 hours.  CBC:  Recent Labs Lab 09/05/16 1928 09/06/16 0345 09/06/16 0700 09/07/16 0814 09/08/16 0343  WBC 6.4 8.4 9.0 7.5 6.6  HGB 15.5 14.5 14.7 16.9 18.3*  HCT 43.9 41.4 41.6 48.2 51.0  MCV 94.6 94.1 93.5 93.1 93.4  PLT 259 238 237 271 271    Cardiac Enzymes:  Recent Labs Lab 09/05/16 0842 09/05/16 2306  09/06/16 0345 09/06/16 0700 09/06/16 1206  CKTOTAL  --   --   --  455*  --   TROPONINI <0.03 1.22* 2.23*  --  3.42*    BNP: BNP (last 3 results) No results for input(s): BNP in the last 8760 hours.  ProBNP (last 3 results) No results for input(s): PROBNP in the last 8760 hours.    Other results:  Imaging: Nm Tumor Localization W Spect  Result Date: 09/08/2016 CLINICAL DATA:  HEART FAILURE. CONCERN FOR CARDIAC AMYLOIDOSIS. Chest pain. EXAM: NUCLEAR MEDICINE TUMOR LOCALIZATION. PYP CARDIAC AMYLOIDOSIS SCAN WITH SPECT TECHNIQUE: Following intravenous administration of radiopharmaceutical, anterior planar images of the chest were obtained. Regions of interest were placed on the heart and contralateral chest wall for quantitative assessment. Additional SPECT imaging of the chest was obtained. RADIOPHARMACEUTICALS:  20.4 mCi TECHNETIUM 99 PYROPHOSPHATE FINDINGS: Planar Visual assessment: Anterior planar imaging demonstrates radiotracer uptake within the heart equal to uptake within the adjacent ribs (Grade 1/ 2). Quantitative assessment : Quantitative assessment of the cardiac  uptake compared to the contralateral chest wall is equal to 1.1 (H/CL = 1.1) . SPECT assessment: SPECT imaging of the chest demonstrates mild radiotracer accumulation within the LEFT ventricle equal to or slightly less than accumulation within ribs. IMPRESSION: Strict categorical analysis of visual and quantitative assessment (grade 1/ 2 and H/CLL equal 1.1) are equivocal for transthyretin amyloidosis. However, assessments are on the lower end of the range of the equivocal category and therefore favor non involvement by transthyretin amyloidosis Electronically Signed   By: Suzy Bouchard M.D.   On: 09/08/2016 15:42      Medications:     Scheduled Medications: . aspirin EC  81 mg Oral Daily  . atorvastatin  40 mg Oral q1800  . carvedilol  6.25 mg Oral BID WC  . folic acid  1 mg Oral Daily  . furosemide  20 mg Oral Daily  . losartan  12.5 mg Oral Daily  . multivitamin with minerals  1 tablet Oral Daily  . pantoprazole  40 mg Oral Daily  . potassium chloride  20 mEq Oral BID  . sodium chloride flush  3 mL Intravenous Q12H  . sucralfate  1 g Oral TID WC & HS  . thiamine  100 mg Oral Daily     Infusions: . sodium chloride       PRN Medications:  sodium chloride, promethazine, sodium chloride flush   Assessment:   Emory Gallentine is a 61 y.o. male with hx of non-obstructive CAD, HTN, ETOH and tobacco abuse, and HLD.   Admitted 09/05/16 with chest pain and elevated troponin. Echo showed drop in EF from July. cMRI with LGE.    Plan/Discussion:     1. Acute on chronic systolic CHF due to NICM - cMRI 09/07/16 LVEF 36%, with LGE in basal and mid anterior wall, septum and apex mid myocardial uptake suggests myocarditis or infiltrative cardiomyopathy.  - PYP scan without evidence of TTR amyloid.  - Likely viral myocarditis. - Symptoms much improved but now dry. Give. another 500cc IVF. Hold all HF meds.   2. Non-obstructive CAD - EF out of proportion to amount of CAD - Continue  ASA and atorvastatin 40 mg daily.  -  3. HTN - BP low today in setting of over diuresis. Plan as above.   4. AKI - Over diuresed. Give IVF. Hold HF meds. Suspect may get worse again tomorrow   5. ETOH abuse - Strongly encouraged cessation  No d/c until BP  and renal function improved. Maybe a couple of days.   Length of Stay: 3   Susano Cleckler MD 09/09/2016, 11:36 AM  Advanced Heart Failure Team Pager [phone] (M-F; 7a - 4p)  Please contact [facility] for night-coverage after hours (4p -7a ) and weekends on [email or web address]

## 2016-09-10 ENCOUNTER — Encounter (HOSPITAL_COMMUNITY): Payer: Medicaid Other

## 2016-09-10 DIAGNOSIS — I5023 Acute on chronic systolic (congestive) heart failure: Secondary | ICD-10-CM

## 2016-09-10 DIAGNOSIS — I5021 Acute systolic (congestive) heart failure: Secondary | ICD-10-CM

## 2016-09-10 DIAGNOSIS — E871 Hypo-osmolality and hyponatremia: Secondary | ICD-10-CM

## 2016-09-10 DIAGNOSIS — N179 Acute kidney failure, unspecified: Secondary | ICD-10-CM

## 2016-09-10 DIAGNOSIS — R7989 Other specified abnormal findings of blood chemistry: Secondary | ICD-10-CM

## 2016-09-10 LAB — BASIC METABOLIC PANEL
ANION GAP: 8 (ref 5–15)
BUN: 23 mg/dL — AB (ref 6–20)
CALCIUM: 8.8 mg/dL — AB (ref 8.9–10.3)
CO2: 24 mmol/L (ref 22–32)
Chloride: 100 mmol/L — ABNORMAL LOW (ref 101–111)
Creatinine, Ser: 1.32 mg/dL — ABNORMAL HIGH (ref 0.61–1.24)
GFR calc Af Amer: >60 mL/min (ref >60)
GFR, EST NON AFRICAN AMERICAN: 57 mL/min — AB (ref >60)
Glucose, Bld: 94 mg/dL (ref 65–99)
POTASSIUM: 3.7 mmol/L (ref 3.5–5.1)
SODIUM: 132 mmol/L — AB (ref 135–145)

## 2016-09-10 MED ORDER — ATORVASTATIN CALCIUM 20 MG PO TABS: 20.0000 mg | ORAL_TABLET | Freq: Every day | ORAL | 1 refills | Status: DC

## 2016-09-10 MED ORDER — LOSARTAN POTASSIUM 25 MG PO TABS: 12.5000 mg | ORAL_TABLET | Freq: Every day | ORAL | 1 refills | Status: DC

## 2016-09-10 MED ORDER — ASPIRIN 81 MG PO TBEC: 81.0000 mg | DELAYED_RELEASE_TABLET | Freq: Every day | ORAL | 1 refills | Status: DC

## 2016-09-10 MED ORDER — CARVEDILOL 3.125 MG PO TABS: 3.1250 mg | ORAL_TABLET | Freq: Two times a day (BID) | ORAL | 1 refills | Status: DC

## 2016-09-10 MED ORDER — PANTOPRAZOLE SODIUM 40 MG PO TBEC: 40.0000 mg | DELAYED_RELEASE_TABLET | Freq: Every day | ORAL | 1 refills | Status: DC

## 2016-09-10 MED ORDER — SPIRONOLACTONE 25 MG PO TABS: 25.0000 mg | ORAL_TABLET | Freq: Every day | ORAL | 1 refills | Status: DC

## 2016-09-10 MED ORDER — SUCRALFATE 1 GM/10ML PO SUSP: 1.0000 g | Freq: Three times a day (TID) | ORAL | 0 refills | Status: DC

## 2016-09-10 NOTE — Discharge Summary (Signed)
Physician Discharge Summary  Andres Boyd XMI:[id number] DOB: [birth year] DOA: 09/05/2016  PCP: Patient, No Pcp Per  Admit date: 09/05/2016 Discharge date: 09/10/2016  Time spent: 35 minutes  Recommendations for Outpatient Follow-up:  1. Repeat BMET to follow electrolytes and renal function  2. Further adjust HF medications base on patient volume and tolerance  3. Arrange for outpatient hepatic MRI w/o contrast to better assess echogenic liver lesion seen on [place].   Discharge Diagnoses:  Principal Problem:   Chest pain Active Problems:   Hypertension   Tobacco use disorder   Alcohol abuse   Prolonged Q-T interval on ECG   AKI (acute kidney injury) ([place])   Hyponatremia   Acute systolic CHF (congestive heart failure) ([place])   Discharge Condition: stable and improved. Discharge home with instructions to follow up with Heart failure clinic in 10 days.  Diet recommendation: heart healthy/low sodium diet   Filed Weights   09/08/16 0523 09/09/16 0500 09/10/16 0452  Weight: 78.2 kg (172 lb 4.8 oz) 78.4 kg (172 lb 12.8 oz) 77.8 kg (171 lb 9.6 oz)    History of present illness:  As per H&P written by Dr. Denton Brick on 09/05/16  61 y.o. male with medical history significant for-  NSTEMI- 07/2015, QT prolongation tobacco abuse, alcohol abuse. Patient presented with complaints of chest pain- mostly center to right chest region, that started earlier this morning, that woke him up from sleep. Patient reported associated vomiting 5 episodes today, nonbloody. Patient also endorses some epigastric/right upper quadrant pain with chest pain.  Patient initially presented to the ED this a.m. Morning, troponin was negative, EKG was unchanged and with results of recent cardiac cath with nonobstructive disease July 2018 patient was sent home with return precautions. Patient presented later in the evening evening with persistent chest pain, and vomiting.  Patient smokes about 1 pack per day, and drinks 2-3 cans of  beer almost daily. Denies cocaine use.  Hospital Course:  Chest pain-  -history of NSTEMI- 07/2016.Cardiac cath 07/04/2016- 25-50% nonobstructive disease.  - troponins elevated -cath 9/5: non-obstructive CAD - continue ASA, low dose coreg and statin -? Viral Myocarditis: ESR normal; MRI with concerns for infiltrative disease; most likely alcohol consumption contributing. -following cardiology rec's, discharge on coreg, losartan and spironolactone   Right upper quadrant/epigastric pain-with newtransaminitis- normal alk phos -patient with hx of alcohol abuse; but not 2/1 pattern appreciated between AST/ALT -U/S shows: 1.5 cm echogenic lesion within the left hepatic lobe, not clearly seen on prior CT. This could represent a hemangioma, however, given the patient's history of alcohol abuse, further evaluation with MRI with and without contrast is recommended (recommending to arrange this as an outpatient) -Acute Hepatitis panel negative -potentially due to vascular congestion from CHF   Alcohol abuse/GERD/gastritis- last drink evening of 09/04/2016. Drinks 2-3 beers almost daily. Was Trying to cut down -kept on CIWA protocol -no signs of withdrawal  -continue daily MV to supplement thiamine and folic acid  -since patient was complaining of abdominal discomfort and nausea, will continue PPI and carafate for presumed underlying gastritis.  Moderate Hemoglobinuria- new. 0-5 rbcs. 100 protein. -Possibly some component of glomerular disease. -CK only mildly elevated,advise to maintain adequate hydration  -HIV negative -improved with IV's -Further work up as an outpatient   Prolonged QTC- 498 -repeat EKG with stable QT -minimizing meds that can prolonged QT  Hyponatremia and elevated Cr -due to over-diuresis  -all his HF meds stopped for 36 hours and patient received 1L of IVF's back -improved  renal function and electrolytes at discharge (Na 132 and Cr 1.3) Advise to maintain adequate  hydration  Acute systolic CHF: NICM -patient euvolemic at discharge -after discussing with cardiology service, will discharge on low dose losartan daily, low dose coreg BID and spironolactone 54m -advise to follow good hydration and low sodium diet -follow up with heart failure clinic as an outpatient  -instructed to check weight on daily basis and to be compliant with medications   Procedures:  Left heart cath  2-[place]  Cardiac MRI   Consultations:  Cardiology   Discharge Exam: Vitals:   09/10/16 0452 09/10/16 1018  BP: (!) 85/53 (!) 111/98  Pulse: 69   Resp: 16 10  Temp: 98.9 F (37.2 C)   SpO2: 96% 100%   General exam: patient feeling much better, denies CP, SOB, lightheadedness and dizziness. He is asking to be discharge.     Respiratory system: CTA bilaterally, no wheezing, no crackles, normal resp effort.  Cardiovascular system: S1 and S2, no JVD, no rubs, no gallops. Gastrointestinal system: no distension, no abd pain, positive BS, positive BS.   Central nervous system: AAOX3, no focal motor or neurologic deficit; CN 2-12 intact.   Extremities: no edema, no cyanosis  Psychiatry: judgement and insight appear normal; no SI and no hallucinations.   Discharge Instructions   Discharge Instructions    Discharge instructions    Complete by:  As directed    Take medications as prescribed Follow heart healthy diet (less than 2 gram sodium intake) Check weight on daily basis  Maintain adequate hydration Stop drinking alcohol  Follow up as instructed with Heart Failure clinic     Current Discharge Medication List    START taking these medications   Details  aspirin EC 81 MG EC tablet Take 1 tablet (81 mg total) by mouth daily. Qty: 30 tablet, Refills: 1    carvedilol (COREG) 3.125 MG tablet Take 1 tablet (3.125 mg total) by mouth 2 (two) times daily. Qty: 60 tablet, Refills: 1    losartan (COZAAR) 25 MG tablet Take 0.5 tablets (12.5 mg total) by mouth at  bedtime. Qty: 30 tablet, Refills: 1    pantoprazole (PROTONIX) 40 MG tablet Take 1 tablet (40 mg total) by mouth daily. Qty: 30 tablet, Refills: 1    spironolactone (ALDACTONE) 25 MG tablet Take 1 tablet (25 mg total) by mouth daily. Qty: 30 tablet, Refills: 1    sucralfate (CARAFATE) 1 GM/10ML suspension Take 10 mLs (1 g total) by mouth 3 (three) times daily. Qty: 420 mL, Refills: 0      CONTINUE these medications which have CHANGED   Details  atorvastatin (LIPITOR) 20 MG tablet Take 1 tablet (20 mg total) by mouth daily at 6 PM. Qty: 30 tablet, Refills: 1   Associated Diagnoses: NSTEMI (non-ST elevated myocardial infarction) ([place]      CONTINUE these medications which have NOT CHANGED   Details  Multiple Vitamin (MULTIVITAMIN WITH MINERALS) TABS tablet Take 1 tablet by mouth daily.      STOP taking these medications     isosorbide mononitrate (IMDUR) 30 MG 24 hr tablet      lisinopril (PRINIVIL,ZESTRIL) 5 MG tablet        No Known Allergies Follow-up Information    Bensimhon, DShaune Pascal MD. Schedule an appointment as soon as possible for a visit in 10 day(s).   Specialty:  Cardiology Contact information: [phone]  The results of significant diagnostics from this hospitalization (including imaging, microbiology, ancillary and laboratory) are listed below for reference.    Significant Diagnostic Studies: Dg Chest 2 View  Result Date: 09/05/2016 CLINICAL DATA:  Right chest pain today with nausea and weakness. EXAM: CHEST  2 VIEW COMPARISON:  PA and lateral chest 07/02/2016 and 07/21/2015. FINDINGS: The chest is hyperexpanded with attenuation of the pulmonary vasculature. Lungs are clear. Heart size is normal. No pneumothorax or pleural fluid. No focal bony abnormality. IMPRESSION: No acute disease.  The lungs appear emphysematous. Electronically Signed   By: Inge Rise M.D.   On: 09/05/2016  09:03   Nm Tumor Localization W Spect  Result Date: 09/08/2016 CLINICAL DATA:  HEART FAILURE. CONCERN FOR CARDIAC AMYLOIDOSIS. Chest pain. EXAM: NUCLEAR MEDICINE TUMOR LOCALIZATION. PYP CARDIAC AMYLOIDOSIS SCAN WITH SPECT TECHNIQUE: Following intravenous administration of radiopharmaceutical, anterior planar images of the chest were obtained. Regions of interest were placed on the heart and contralateral chest wall for quantitative assessment. Additional SPECT imaging of the chest was obtained. RADIOPHARMACEUTICALS:  20.4 mCi TECHNETIUM 99 PYROPHOSPHATE FINDINGS: Planar Visual assessment: Anterior planar imaging demonstrates radiotracer uptake within the heart equal to uptake within the adjacent ribs (Grade 1/ 2). Quantitative assessment : Quantitative assessment of the cardiac uptake compared to the contralateral chest wall is equal to 1.1 (H/CL = 1.1) . SPECT assessment: SPECT imaging of the chest demonstrates mild radiotracer accumulation within the LEFT ventricle equal to or slightly less than accumulation within ribs. IMPRESSION: Strict categorical analysis of visual and quantitative assessment (grade 1/ 2 and H/CLL equal 1.1) are equivocal for transthyretin amyloidosis. However, assessments are on the lower end of the range of the equivocal category and therefore favor non involvement by transthyretin amyloidosis Electronically Signed   By: Suzy Bouchard M.D.   On: 09/08/2016 15:42   Mr Cardiac Morphology W Wo Contrast  Result Date: 09/07/2016 CLINICAL DATA:  Myocarditis EXAM: CARDIAC MRI TECHNIQUE: The patient was scanned on a 1.5 Tesla GE magnet. A dedicated cardiac coil was used. Functional imaging was done using Fiesta sequences. 2,3, and 4 chamber views were done to assess for RWMA's. Modified Simpson's rule using a short axis stack was used to calculate an ejection fraction on a dedicated work Conservation officer, nature. The patient received 30 cc of Multihance. After 10 minutes inversion  recovery sequences were used to assess for infiltration and scar tissue. CONTRAST:  30 cc Multihance FINDINGS: The atria were normal sized. The RV was normal in size and function. There was no ASD/VSD or pericardial effusion. The mitral and aortic valve were normal Aortic root normal 30 mm. The LV was moderately dilated with diffuse hypokinesis and abnormal septal motion. There was some apical sparing. Delayed enhancement images were abnormal. There was fairly extensive mid myocardial uptake in the septum, apex and mid and basal anterior wall IMPRESSION: 1) Moderate LVE with global hypokinesis, abnormal septal motion and some apical sparing EF 36% 2) Abnormal delayed gadolinium uptake in basal and mid anterior wall, septum and apex Mid myocardial uptake suggests myocarditis or infiltrative cardiomyopathy 3) Normal RV size and function 4) No pericardial effusion Jenkins Rouge Electronically Signed   By: Jenkins Rouge M.D.   On: 09/07/2016 11:25   US Abdomen Limited Ruq  Result Date: 09/06/2016 CLINICAL DATA:  Right upper quadrant pain for 2 days. EXAM: ULTRASOUND ABDOMEN LIMITED RIGHT UPPER QUADRANT COMPARISON:  CT abdomen and pelvis dated July 31, 2007. FINDINGS: Gallbladder: No gallstones or wall thickening visualized. No  sonographic Percell Miller sign noted by sonographer. Common bile duct: Diameter: 5 mm, normal. Liver: In the left lobe, there is a 1.5 x 1.0 x 1.1 cm echogenic lesion. Within normal limits in parenchymal echogenicity. Portal vein is patent on color Doppler imaging with normal direction of blood flow towards the liver. IMPRESSION: 1. 1.5 cm echogenic lesion within the left hepatic lobe, not clearly seen on prior CT. This could represent a hemangioma, however, given the patient's history of alcohol abuse, further evaluation with MRI with and without contrast is recommended. 2. Normal sonographic appearance of the gallbladder. Electronically Signed   By: Titus Dubin M.D.   On: 09/06/2016 08:51     Microbiology: No results found for this or any previous visit (from the past 240 hour(s)).   Labs: Basic Metabolic Panel:  Recent Labs Lab 09/06/16 0345 09/07/16 0814 09/08/16 0343 09/08/16 1653 09/09/16 0239 09/10/16 0253  NA 132*  --  128* 127* 126* 132*  K 3.7  --  3.6 3.8 3.8 3.7  CL 102  --  93* 92* 92* 100*  CO2 20*  --  '24 25 24 24  ' GLUCOSE 124*  --  112* 105* 95 94  BUN 9  --  16 23* 35* 23*  CREATININE 0.93  --  1.11 1.38* 2.01* 1.32*  CALCIUM 9.0  --  9.4 9.1 8.8* 8.8*  MG  --  2.3  --   --   --   --    Liver Function Tests:  Recent Labs Lab 09/05/16 0842 09/05/16 1928 09/05/16 2306 09/06/16 0345  AST 64* 55* 57* 57*  ALT 80* 76* 72* 62  ALKPHOS 55 55 53 48  BILITOT 0.8 0.7 0.6 0.7  PROT 8.1 9.1* 8.9* 8.1  ALBUMIN 4.1 4.4 4.3 4.0    Recent Labs Lab 09/05/16 0952 09/05/16 1928 09/05/16 2306  LIPASE '26 22 25  ' AMYLASE  --   --  58   CBC:  Recent Labs Lab 09/05/16 1928 09/06/16 0345 09/06/16 0700 09/07/16 0814 09/08/16 0343  WBC 6.4 8.4 9.0 7.5 6.6  HGB 15.5 14.5 14.7 16.9 18.3*  HCT 43.9 41.4 41.6 48.2 51.0  MCV 94.6 94.1 93.5 93.1 93.4  PLT 259 238 237 271 271   Cardiac Enzymes:  Recent Labs Lab 09/05/16 0842 09/05/16 2306 09/06/16 0345 09/06/16 0700 09/06/16 1206  CKTOTAL  --   --   --  455*  --   TROPONINI <0.03 1.22* 2.23*  --  3.42*    Signed:  Barton Dubois MD.  [facility] 09/10/2016, 12:16 PM

## 2016-09-10 NOTE — Progress Notes (Signed)
Advanced Heart Failure Rounding Note   Subjective:    Yesterday hypotensive due to overdiuresis. Got 1L of fluid and recovered.   Feels better today. No further dizziness. Wants to go home and go fishing.  SBP ~90-110   Creatinine 2.1 -> 1.3   Objective:   Weight Range:  Vital Signs:   Temp:  [97.8 F (36.6 C)-99.4 F (37.4 C)] 98.9 F (37.2 C) (09/09 0452) Pulse Rate:  [65-69] 69 (09/09 0452) Resp:  [10-17] 10 (09/09 1018) BP: (70-134)/(46-119) 111/98 (09/09 1018) SpO2:  [96 %-100 %] 100 % (09/09 1018) Weight:  [171 lb 9.6 oz (77.8 kg)] 171 lb 9.6 oz (77.8 kg) (09/09 0452) Last BM Date: 09/07/16  Weight change: Filed Weights   09/08/16 0523 09/09/16 0500 09/10/16 0452  Weight: 172 lb 4.8 oz (78.2 kg) 172 lb 12.8 oz (78.4 kg) 171 lb 9.6 oz (77.8 kg)    Intake/Output:   Intake/Output Summary (Last 24 hours) at 09/10/16 1122 Last data filed at 09/10/16 1021  Gross per 24 hour  Intake                0 ml  Output             3950 ml  Net            -3950 ml     Physical Exam: General:  Well appearing. No resp difficulty HEENT: normal Neck: supple. no JVD. Carotids 2+ bilat; no bruits. No lymphadenopathy or thryomegaly appreciated. Cor: PMI laterally displaced. Regular rate & rhythm. No rubs, gallops or murmurs. Lungs: clear Abdomen: soft, nontender, nondistended. No hepatosplenomegaly. No bruits or masses. Good bowel sounds. Extremities: no cyanosis, clubbing, rash, edema Neuro: alert & orientedx3, cranial nerves grossly intact. moves all 4 extremities w/o difficulty. Affect pleasant   Telemetry: NSR 60-70s Personally reviewed   Labs: Basic Metabolic Panel:  Recent Labs Lab 09/06/16 0345 09/07/16 0814 09/08/16 0343 09/08/16 1653 09/09/16 0239 09/10/16 0253  NA 132*  --  128* 127* 126* 132*  K 3.7  --  3.6 3.8 3.8 3.7  CL 102  --  93* 92* 92* 100*  CO2 20*  --  24 25 24 24   GLUCOSE 124*  --  112* 105* 95 94  BUN 9  --  16 23* 35* 23*    CREATININE 0.93  --  1.11 1.38* 2.01* 1.32*  CALCIUM 9.0  --  9.4 9.1 8.8* 8.8*  MG  --  2.3  --   --   --   --     Liver Function Tests:  Recent Labs Lab 09/05/16 0842 09/05/16 1928 09/05/16 2306 09/06/16 0345  AST 64* 55* 57* 57*  ALT 80* 76* 72* 62  ALKPHOS 55 55 53 48  BILITOT 0.8 0.7 0.6 0.7  PROT 8.1 9.1* 8.9* 8.1  ALBUMIN 4.1 4.4 4.3 4.0    Recent Labs Lab 09/05/16 0952 09/05/16 1928 09/05/16 2306  LIPASE 26 22 25   AMYLASE  --   --  58   No results for input(s): AMMONIA in the last 168 hours.  CBC:  Recent Labs Lab 09/05/16 1928 09/06/16 0345 09/06/16 0700 09/07/16 0814 09/08/16 0343  WBC 6.4 8.4 9.0 7.5 6.6  HGB 15.5 14.5 14.7 16.9 18.3*  HCT 43.9 41.4 41.6 48.2 51.0  MCV 94.6 94.1 93.5 93.1 93.4  PLT 259 238 237 271 271    Cardiac Enzymes:  Recent Labs Lab 09/05/16 0842 09/05/16 2306 09/06/16 0345 09/06/16 0700 09/06/16 1206  CKTOTAL  --   --   --  [place] <0.03 1.22* 2.23*  --  3.42*    BNP: BNP (last 3 results) No results for input(s): BNP in the last 8760 hours.  ProBNP (last 3 results) No results for input(s): PROBNP in the last 8760 hours.    Other results:  Imaging: Nm Tumor Localization W Spect  Result Date: 09/08/2016 CLINICAL DATA:  HEART FAILURE. CONCERN FOR CARDIAC AMYLOIDOSIS. Chest pain. EXAM: NUCLEAR MEDICINE TUMOR LOCALIZATION. PYP CARDIAC AMYLOIDOSIS SCAN WITH SPECT TECHNIQUE: Following intravenous administration of radiopharmaceutical, anterior planar images of the chest were obtained. Regions of interest were placed on the heart and contralateral chest wall for quantitative assessment. Additional SPECT imaging of the chest was obtained. RADIOPHARMACEUTICALS:  20.4 mCi TECHNETIUM 99 PYROPHOSPHATE FINDINGS: Planar Visual assessment: Anterior planar imaging demonstrates radiotracer uptake within the heart equal to uptake within the adjacent ribs (Grade 1/ 2). Quantitative assessment : Quantitative  assessment of the cardiac uptake compared to the contralateral chest wall is equal to 1.1 (H/CL = 1.1) . SPECT assessment: SPECT imaging of the chest demonstrates mild radiotracer accumulation within the LEFT ventricle equal to or slightly less than accumulation within ribs. IMPRESSION: Strict categorical analysis of visual and quantitative assessment (grade 1/ 2 and H/CLL equal 1.1) are equivocal for transthyretin amyloidosis. However, assessments are on the lower end of the range of the equivocal category and therefore favor non involvement by transthyretin amyloidosis Electronically Signed   By: Suzy Bouchard M.D.   On: 09/08/2016 15:42     Medications:     Scheduled Medications: . aspirin EC  81 mg Oral Daily  . atorvastatin  40 mg Oral q1800  . folic acid  1 mg Oral Daily  . multivitamin with minerals  1 tablet Oral Daily  . pantoprazole  40 mg Oral Daily  . sodium chloride flush  3 mL Intravenous Q12H  . sucralfate  1 g Oral TID WC & HS  . thiamine  100 mg Oral Daily    Infusions: . sodium chloride      PRN Medications: sodium chloride, promethazine, sodium chloride flush   Assessment:   Andres Boyd is a 61 y.o. male with hx of non-obstructive CAD, HTN, ETOH and tobacco abuse, and HLD.   Admitted 09/05/16 with chest pain and elevated troponin. Echo showed drop in EF from July. cMRI with LGE.    Plan/Discussion:     1. Acute on chronic systolic CHF due to NICM - cMRI 09/07/16 LVEF 36%, with LGE in basal and mid anterior wall, septum and apex mid myocardial uptake suggests myocarditis or infiltrative cardiomyopathy.  - PYP scan without evidence of TTR amyloid.  - Likely viral myocarditis. - Was overdiuresed yesterday but improved with 1L NS - Can go home today on  Losartan 12.5 qhs Carvedilol 3.125 bid Spiro 25mg  daily ECASA 81 Atorva 20  F/u in HF Clinic 1 week   2. Non-obstructive CAD - EF out of proportion to amount of CAD - Continue ASA and atorvastatin  20 mg daily.  -  3. HTN - BP improved after volume resuscitation   4. AKI - Resolved with fluid resuscitation   5. ETOH abuse - Strongly encouraged cessation   Length of Stay: 4   Bensimhon, Daniel MD 09/10/2016, 11:22 AM  Advanced Heart Failure Team Pager [phone] (M-F; 7a - 4p)  Please contact [facility] for night-coverage after hours (4p -7a ) and weekends on [email or web address]

## 2016-09-11 LAB — IMMUNOFIXATION ELECTROPHORESIS
IGA: 334 mg/dL (ref 61–437)
IGG (IMMUNOGLOBIN G), SERUM: 1841 mg/dL — AB (ref 700–1600)
IgM (Immunoglobulin M), Srm: 127 mg/dL (ref 20–172)
TOTAL PROTEIN ELP: 7.2 g/dL (ref 6.0–8.5)

## 2016-09-11 LAB — PROTEIN ELECTROPHORESIS, SERUM
A/G RATIO SPE: 0.8 (ref 0.7–1.7)
ALPHA-1-GLOBULIN: 0.2 g/dL (ref 0.0–0.4)
Albumin ELP: 3.5 g/dL (ref 2.9–4.4)
Alpha-2-Globulin: 0.9 g/dL (ref 0.4–1.0)
Beta Globulin: 1.1 g/dL (ref 0.7–1.3)
GLOBULIN, TOTAL: 4.3 g/dL — AB (ref 2.2–3.9)
Gamma Globulin: 2.1 g/dL — ABNORMAL HIGH (ref 0.4–1.8)
TOTAL PROTEIN ELP: 7.8 g/dL (ref 6.0–8.5)

## 2016-09-12 ENCOUNTER — Telehealth (HOSPITAL_COMMUNITY): Payer: Self-pay | Admitting: Surgery

## 2016-09-12 NOTE — Telephone Encounter (Signed)
I called Andres Boyd to inform him of his post discharge appt.  The appt is made for 9/18 at 1130 am.  I asked that he call me back with any questions or concerns related to his HF or appt date/time.

## 2016-09-19 ENCOUNTER — Encounter (HOSPITAL_COMMUNITY): Payer: Self-pay

## 2016-09-19 ENCOUNTER — Ambulatory Visit (HOSPITAL_COMMUNITY)
Admission: RE | Admit: 2016-09-19 | Discharge: 2016-09-19 | Disposition: A | Discharge status: Home / Self Care | Payer: Medicaid Other | Source: Ambulatory Visit | Attending: Cardiology | Admitting: Cardiology

## 2016-09-19 ENCOUNTER — Other Ambulatory Visit (HOSPITAL_COMMUNITY): Payer: Self-pay

## 2016-09-19 VITALS — BP 118/70 | HR 70 | Wt 183.0 lb

## 2016-09-19 DIAGNOSIS — I2510 Atherosclerotic heart disease of native coronary artery without angina pectoris: Secondary | ICD-10-CM | POA: Diagnosis not present

## 2016-09-19 DIAGNOSIS — I428 Other cardiomyopathies: Secondary | ICD-10-CM

## 2016-09-19 DIAGNOSIS — I429 Cardiomyopathy, unspecified: Secondary | ICD-10-CM | POA: Diagnosis not present

## 2016-09-19 DIAGNOSIS — F1010 Alcohol abuse, uncomplicated: Secondary | ICD-10-CM | POA: Diagnosis not present

## 2016-09-19 DIAGNOSIS — Z8249 Family history of ischemic heart disease and other diseases of the circulatory system: Secondary | ICD-10-CM | POA: Insufficient documentation

## 2016-09-19 DIAGNOSIS — F1210 Cannabis abuse, uncomplicated: Secondary | ICD-10-CM | POA: Diagnosis not present

## 2016-09-19 DIAGNOSIS — Z833 Family history of diabetes mellitus: Secondary | ICD-10-CM | POA: Diagnosis not present

## 2016-09-19 DIAGNOSIS — I10 Essential (primary) hypertension: Secondary | ICD-10-CM | POA: Diagnosis not present

## 2016-09-19 DIAGNOSIS — E785 Hyperlipidemia, unspecified: Secondary | ICD-10-CM | POA: Diagnosis not present

## 2016-09-19 DIAGNOSIS — I110 Hypertensive heart disease with heart failure: Secondary | ICD-10-CM | POA: Insufficient documentation

## 2016-09-19 DIAGNOSIS — F17210 Nicotine dependence, cigarettes, uncomplicated: Secondary | ICD-10-CM | POA: Diagnosis not present

## 2016-09-19 DIAGNOSIS — I5022 Chronic systolic (congestive) heart failure: Secondary | ICD-10-CM | POA: Insufficient documentation

## 2016-09-19 DIAGNOSIS — K219 Gastro-esophageal reflux disease without esophagitis: Secondary | ICD-10-CM | POA: Insufficient documentation

## 2016-09-19 DIAGNOSIS — I252 Old myocardial infarction: Secondary | ICD-10-CM | POA: Diagnosis not present

## 2016-09-19 DIAGNOSIS — I440 Atrioventricular block, first degree: Secondary | ICD-10-CM | POA: Diagnosis not present

## 2016-09-19 DIAGNOSIS — Z7982 Long term (current) use of aspirin: Secondary | ICD-10-CM | POA: Diagnosis not present

## 2016-09-19 LAB — BASIC METABOLIC PANEL
Anion gap: 6 (ref 5–15)
BUN: 14 mg/dL (ref 6–20)
CO2: 25 mmol/L (ref 22–32)
CREATININE: 1.29 mg/dL — AB (ref 0.61–1.24)
Calcium: 9.2 mg/dL (ref 8.9–10.3)
Chloride: 107 mmol/L (ref 101–111)
GFR calc Af Amer: >60 mL/min (ref >60)
GFR calc non Af Amer: 58 mL/min — ABNORMAL LOW (ref >60)
GLUCOSE: 93 mg/dL (ref 65–99)
Potassium: 4.3 mmol/L (ref 3.5–5.1)
Sodium: 138 mmol/L (ref 135–145)

## 2016-09-19 MED ORDER — LOSARTAN POTASSIUM 25 MG PO TABS: 25.0000 mg | ORAL_TABLET | Freq: Every day | ORAL | 3 refills | Status: DC

## 2016-09-19 MED ORDER — DIGOXIN 125 MCG PO TABS: 0.1250 mg | ORAL_TABLET | Freq: Every day | ORAL | 3 refills | Status: DC

## 2016-09-19 NOTE — Patient Instructions (Addendum)
INCREASE Losartan to 25 mg (1 whole tablet) once daily at bedtime.  START Digoxin 0.125 mg tablet once daily.  Routine lab work today. Will notify you of abnormal results, otherwise no news is good news!  Follow up 2 weeks with CHF clinical pharmacist Ileene Patrick.  _______________________________________________________________  _______________________________________________________________  Follow up 1 month.  _______________________________________________________________  _______________________________________________________________  Take all medication as prescribed the day of your appointment. Bring all medications with you to your appointment.  Do the following things EVERYDAY: 1) Weigh yourself in the morning before breakfast. Write it down and keep it in a log. 2) Take your medicines as prescribed 3) Eat low salt foods-Limit salt (sodium) to 2000 mg per day.  4) Stay as active as you can everyday 5) Limit all fluids for the day to less than 2 liters

## 2016-09-19 NOTE — Progress Notes (Signed)
Paramedicine Encounter   Patient ID: Andres Boyd , male,   DOB: [birth year],60 y.o.,  MRN: [id number]   Met patient in clinic today with provider.  Time spent with patient 46mn   First meeting with pt in the clinic. He was recently in the hosp for chf. Pt states he goes to [facility] on [address], no issues obtaining his meds post d/c. Reports he lives with son, his son fixes his pill box up for him weekly, meds are affordable, uses [facility] and is set up with medicaid transportation if needed. Needs more info on better diet and fluid restriction. Will call him next week to set up home visit next week.   KMarylouise Stacks EMT-Paramedic 09/19/2016   ACTION: Home visit completed

## 2016-09-19 NOTE — Progress Notes (Signed)
Advanced Heart Failure Clinic Note    Primary Care: No PCP Primary Cardiologist: Dr. Marlou Porch, Dr. Haroldine Laws  HPI: Andres Boyd is a 61 y.o. male with hx of non-obstructive CAD, HTN, ETOH and tobacco abuse, and HLD.   Pt previously admitted 07/03/2016 wth sharp chest pain. At this time was drinking > 8 ETOH beverages each night and attributed it to GERD. Trop peaked at 0.31. UDS + for marijuana and opioids. Felt to be related to ETOH intake, but Nuclear stress showed possible inferior ischemia. Cath showed moderated non-obstructive disease as below.   Pt [facility] 09/05[facility]16 with severe midsternal chest pain with radiation to right and left chest, right back, and abdomen. No SOB. Associated with diaphoresis and N/V. Given SL NTG x 3 with no relief. Troponin negative so sent home. He continued to worsen throughout the day, so returned to ED. Recheck Troponin 0.31. Cardiology consulted. Pertinent labs on admission include K 4.0, Creatinine 1.22, Troponin peaked at 3.42 on 9.5.18. D-dimer negative.   Repeat cath 09/06/16 as below with no change to CAD from 07/2016. Echo with significant drop in EF since July (50-55% -> 25-30%). cMRI with LGE uptake suggestive of myocarditis vs infiltrative CMP. PYP scan without evidence of TTR amyloid. He was diuresed 7 pounds and discharged on 09/10/16. Discharge weight was 171 pounds. Started on losartan, Coreg, Spiro.   He presents today for hospital follow up. Weights at home 177-178 pounds. Drinking more than 2L a day, trying to watch sodium but he does the best he can with limited income. Can walk a mile without SOB. Denies lower extremity edema, orthopnea, chest pain. Taking all medications. Drinking 2 beers in the last 3 weeks. Smokes occassionally.    Review of Systems: [y] = yes, [ ]  = no   General: Weight gain Blue.Reese ]; Weight loss [ ] ; Anorexia [ ] ; Fatigue [ ] ; Fever [ ] ; Chills [ ] ; Weakness [ ]   Cardiac: Chest pain/pressure [ ] ; Resting SOB [ ] ;  Exertional SOB [ ] ; Orthopnea [ ] ; Pedal Edema [ ] ; Palpitations [ ] ; Syncope [ ] ; Presyncope [ ] ; Paroxysmal nocturnal dyspnea[ ]   Pulmonary: Cough [ ] ; Wheezing[ ] ; Hemoptysis[ ] ; Sputum [ ] ; Snoring [ ]   GI: Vomiting[ ] ; Dysphagia[ ] ; Melena[ ] ; Hematochezia [ ] ; Heartburn[ ] ; Abdominal pain [ ] ; Constipation [ ] ; Diarrhea [ ] ; BRBPR [ ]   GU: Hematuria[ ] ; Dysuria [ ] ; Nocturia[ ]   Vascular: Pain in legs with walking [ ] ; Pain in feet with lying flat [ ] ; Non-healing sores [ ] ; Stroke [ ] ; TIA [ ] ; Slurred speech [ ] ;  Neuro: Headaches[ ] ; Vertigo[ ] ; Seizures[ ] ; Paresthesias[ ] ;Blurred vision [ ] ; Diplopia [ ] ; Vision changes [ ]   Ortho/Skin: Arthritis Blue.Reese ]; Joint pain [ ] ; Muscle pain [ ] ; Joint swelling [ ] ; Back Pain [ ] ; Rash [ ]   Psych: Depression[ ] ; Anxiety[ ]   Heme: Bleeding problems [ ] ; Clotting disorders [ ] ; Anemia [ ]   Endocrine: Diabetes [ ] ; Thyroid dysfunction[ ]    Past Medical History:  Diagnosis Date  . 1st degree AV block   . Coronary artery disease   . ETOH abuse   . Hyperlipidemia   . Hypertension   . Myocardial infarct ([place]) 07/2015  . Tobacco abuse     Current Outpatient Prescriptions  Medication Sig Dispense Refill  . aspirin EC 81 MG EC tablet Take 1 tablet (81 mg total) by mouth daily. 30 tablet 1  . atorvastatin (LIPITOR) 20  MG tablet Take 1 tablet (20 mg total) by mouth daily at 6 PM. 30 tablet 1  . carvedilol (COREG) 3.125 MG tablet Take 1 tablet (3.125 mg total) by mouth 2 (two) times daily. 60 tablet 1  . losartan (COZAAR) 25 MG tablet Take 0.5 tablets (12.5 mg total) by mouth at bedtime. 30 tablet 1  . Multiple Vitamin (MULTIVITAMIN WITH MINERALS) TABS tablet Take 1 tablet by mouth daily.    . pantoprazole (PROTONIX) 40 MG tablet Take 1 tablet (40 mg total) by mouth daily. 30 tablet 1  . spironolactone (ALDACTONE) 25 MG tablet Take 1 tablet (25 mg total) by mouth daily. 30 tablet 1  . sucralfate (CARAFATE) 1 GM/10ML suspension Take 10 mLs (1 g  total) by mouth 3 (three) times daily. 420 mL 0   No current facility-administered medications for this encounter.     No Known Allergies    Social History   Social History  . Marital status: Single    Spouse name: N/A  . Number of children: N/A  . Years of education: N/A   Occupational History  . Not on file.   Social History Main Topics  . Smoking status: Current Every Day Smoker    Packs/day: 1.00    Years: 36.00    Types: Cigarettes  . Smokeless tobacco: Never Used  . Alcohol use 1.2 - 1.8 oz/week    2 - 3 Cans of beer per week     Comment: 2-3 32oz cans daily  . Drug use: Yes    Types: Marijuana  . Sexual activity: Not on file   Other Topics Concern  . Not on file   Social History Narrative   Patient unemployed. Lives in [place] with son.       Family History  Problem Relation Age of Onset  . Diabetes Mother   . Hypertension Mother   . Diabetes Father     Vitals:   09/19/16 1137  BP: 118/70  Pulse: 70  SpO2: 100%  Weight: 183 lb (83 kg)     PHYSICAL EXAM: General:  Well appearing. No respiratory difficulty HEENT: normal Neck: supple. 6-7  Cm JVD. Carotids 2+ bilat; no bruits. No lymphadenopathy or thyromegaly appreciated. Cor: PMI nondisplaced. Regular rate & rhythm. No rubs, gallops or murmurs. Lungs: clear Abdomen: soft, nontender, nondistended. No hepatosplenomegaly. No bruits or masses. Good bowel sounds. Extremities: no cyanosis, clubbing, rash, edema Neuro: alert & oriented x 3, cranial nerves grossly intact. moves all 4 extremities w/o difficulty. Affect pleasant.    ASSESSMENT & PLAN:  1. Chronic systolic CHF: NICM, degree of CAD out of proportion with CM. cMRI 09/07/16 with LVEF 36%, LGE in basal and mid anterior wall. PYP scan without TTR amyloid. 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  - NYHA II - Volume stable on exam. No need for lasix today.  - Increase losartan to 25 mg daily - Continue Spiro 25 mg daily.  - Continue Coreg 3.125 mg  BID.  - Add digoxin 0.125 mg daily.  - BMET today. - He is meeting with paramedicine today.   2. Non obstructive CAD - Continue ASA and atorvastatin.  - Denies chest pain  3. HTN - Well controlled on current regimen  4. ETOH abuse  - Encouraged cessation.  - He has reduced his ETOH intake. Congratulated him  Follow up with HF PharmD in 2 weeks, may be able to start Entresto. Also will need dig level that day. Follow up with me in 1 month.  Arbutus Leas, NP 09/19/16

## 2016-09-28 ENCOUNTER — Other Ambulatory Visit (HOSPITAL_COMMUNITY): Payer: Self-pay | Admitting: Pharmacist

## 2016-09-28 ENCOUNTER — Other Ambulatory Visit (HOSPITAL_COMMUNITY): Payer: Self-pay

## 2016-09-28 MED ORDER — NICOTINE 14 MG/24HR TD PT24: 14.0000 mg | MEDICATED_PATCH | Freq: Every day | TRANSDERMAL | 0 refills | Status: DC

## 2016-09-28 NOTE — Progress Notes (Signed)
Paramedicine Encounter    Patient ID: Andres Boyd, male    DOB: [birth year], 61 y.o.   MRN: [id number]   Patient Care Team: Patient, No Pcp Per as PCP - General (General Practice)  Patient Active Problem List   Diagnosis Date Noted  . AKI (acute kidney injury) ([place])   . Hyponatremia   . Acute systolic CHF (congestive heart failure) ([place])   . Prolonged Q-T interval on ECG 09/06/2016  . Atypical chest pain   . Elevated troponin   . AV block, 1st degree 07/02/2016  . Chest pain 02/10/2016  . Claudication ([place]) 02/10/2016  . Hypotension 07/21/2015  . History of prolonged Q-T interval on ECG 07/21/2015  . Alcohol abuse 07/21/2015  . Coronary artery disease 07/20/2015  . HLD (hyperlipidemia) 07/19/2015  . Hypertension 07/19/2015  . Tobacco use disorder 07/19/2015  . NSTEMI (non-ST elevated myocardial infarction) ([place]) 07/18/2015    Current Outpatient Prescriptions:  .  aspirin EC 81 MG EC tablet, Take 1 tablet (81 mg total) by mouth daily., Disp: 30 tablet, Rfl: 1 .  atorvastatin (LIPITOR) 20 MG tablet, Take 1 tablet (20 mg total) by mouth daily at 6 PM., Disp: 30 tablet, Rfl: 1 .  carvedilol (COREG) 3.125 MG tablet, Take 1 tablet (3.125 mg total) by mouth 2 (two) times daily., Disp: 60 tablet, Rfl: 1 .  digoxin (LANOXIN) 0.125 MG tablet, Take 1 tablet (0.125 mg total) by mouth daily., Disp: 90 tablet, Rfl: 3 .  losartan (COZAAR) 25 MG tablet, Take 1 tablet (25 mg total) by mouth at bedtime., Disp: 90 tablet, Rfl: 3 .  pantoprazole (PROTONIX) 40 MG tablet, Take 1 tablet (40 mg total) by mouth daily., Disp: 30 tablet, Rfl: 1 .  spironolactone (ALDACTONE) 25 MG tablet, Take 1 tablet (25 mg total) by mouth daily., Disp: 30 tablet, Rfl: 1 .  Multiple Vitamin (MULTIVITAMIN WITH MINERALS) TABS tablet, Take 1 tablet by mouth daily., Disp: , Rfl:  .  sucralfate (CARAFATE) 1 GM/10ML suspension, Take 10 mLs (1 g total) by mouth 3 (three) times daily. (Patient not taking: Reported on 09/28/2016),  Disp: 420 mL, Rfl: 0 No Known Allergies   Social History   Social History  . Marital status: Single    Spouse name: N/A  . Number of children: N/A  . Years of education: N/A   Occupational History  . Not on file.   Social History Main Topics  . Smoking status: Current Every Day Smoker    Packs/day: 1.00    Years: 36.00    Types: Cigarettes  . Smokeless tobacco: Never Used  . Alcohol use 1.2 - 1.8 oz/week    2 - 3 Cans of beer per week     Comment: 2-3 32oz cans daily  . Drug use: Yes    Types: Marijuana  . Sexual activity: Not on file   Other Topics Concern  . Not on file   Social History Narrative   Patient unemployed. Lives in [place] with son.     Physical Exam      Future Appointments Date Time Provider [place]  10/02/2016 1:15 PM [facility] PHARMACY [facility] None  10/17/2016 1:30 PM [facility] PA/NP [facility] None   BP 106/64   Pulse 74   Resp 16   Wt 178 lb (80.7 kg)   SpO2 98%   BMI 26.29 kg/m  Weight yesterday-179.6 Last visit weight-177  Pt reports he is feeling fair,he took a morning walk and was able to do so without sob.  Lives with son and his son does his pill box. His son is here with him. No errors in his pill box. No issues getting his meds financially. [facility] on [address] for his meds. Has transportation set up. Pts son reports he still drinks alcohol and still smokes. Will see if clinic will assist with patches for the smoking-about 5 cigarettes daily per the pt-he also uses the e-cigs.  Encouraged him to stop drinking altogether-he is not interested in any support group or anything to help. Son reports that he has family that comes over that drinks and he drinks more then.  His diet is improving, son reports he is doing better with that-reviewed better options for him- son reported that he was suppose to have a rx of ntg but he doesn't have it.   ACTION: Home visit completed  Marylouise Stacks,  EMT-Paramedic 09/28/16

## 2016-09-28 NOTE — Telephone Encounter (Signed)
Received a message from [employer]ellen [place] [employer], [employer]) that Mr. Deskin would like to try nicotine patches for smoking cessation. Currently smoking 5-6 cigarettes per day so will start at 14 mg daily.   Ruta Hinds. Velva Harman, PharmD, BCPS, CPP Clinical Pharmacist Pager: [phone] Phone: [phone] 09/28/2016 2:45 PM

## 2016-10-02 ENCOUNTER — Other Ambulatory Visit (HOSPITAL_COMMUNITY): Payer: Self-pay

## 2016-10-02 ENCOUNTER — Ambulatory Visit (HOSPITAL_COMMUNITY)
Admission: RE | Admit: 2016-10-02 | Discharge: 2016-10-02 | Disposition: A | Discharge status: Home / Self Care | Payer: Medicaid Other | Source: Ambulatory Visit | Attending: Cardiology | Admitting: Cardiology

## 2016-10-02 VITALS — BP 116/70 | HR 57 | Wt 185.2 lb

## 2016-10-02 DIAGNOSIS — I2510 Atherosclerotic heart disease of native coronary artery without angina pectoris: Secondary | ICD-10-CM | POA: Diagnosis not present

## 2016-10-02 DIAGNOSIS — F17210 Nicotine dependence, cigarettes, uncomplicated: Secondary | ICD-10-CM | POA: Diagnosis not present

## 2016-10-02 DIAGNOSIS — E785 Hyperlipidemia, unspecified: Secondary | ICD-10-CM | POA: Diagnosis not present

## 2016-10-02 DIAGNOSIS — I5022 Chronic systolic (congestive) heart failure: Secondary | ICD-10-CM | POA: Diagnosis present

## 2016-10-02 DIAGNOSIS — I428 Other cardiomyopathies: Secondary | ICD-10-CM | POA: Insufficient documentation

## 2016-10-02 DIAGNOSIS — I110 Hypertensive heart disease with heart failure: Secondary | ICD-10-CM | POA: Insufficient documentation

## 2016-10-02 DIAGNOSIS — K219 Gastro-esophageal reflux disease without esophagitis: Secondary | ICD-10-CM | POA: Diagnosis not present

## 2016-10-02 LAB — BASIC METABOLIC PANEL
ANION GAP: 4 — AB (ref 5–15)
BUN: 18 mg/dL (ref 6–20)
CO2: 27 mmol/L (ref 22–32)
Calcium: 9.3 mg/dL (ref 8.9–10.3)
Chloride: 107 mmol/L (ref 101–111)
Creatinine, Ser: 1.17 mg/dL (ref 0.61–1.24)
GLUCOSE: 84 mg/dL (ref 65–99)
Potassium: 4.1 mmol/L (ref 3.5–5.1)
Sodium: 138 mmol/L (ref 135–145)

## 2016-10-02 LAB — DIGOXIN LEVEL: Digoxin Level: <0.2 ng/mL — ABNORMAL LOW (ref 0.8–2.0)

## 2016-10-02 MED ORDER — NITROGLYCERIN 0.4 MG SL SUBL: 0.4000 mg | SUBLINGUAL_TABLET | SUBLINGUAL | 3 refills | Status: DC | PRN

## 2016-10-02 MED ORDER — SACUBITRIL-VALSARTAN 24-26 MG PO TABS: 1.0000 | ORAL_TABLET | Freq: Two times a day (BID) | ORAL | 11 refills | Status: DC

## 2016-10-02 NOTE — Progress Notes (Signed)
Paramedicine Encounter   Patient ID: Andres Boyd , male,   DOB: November 17, 1955,60 y.o.,  MRN: [id number]   Met patient in clinic today with provider.  Time spent with patient 81mn  His med is changing from losartan to entresto. His son does his pill box and he will let son know that its being changed.  Pt denies any complaints today. His nicotine patches was sent in to pharmacy last week. He will go by there to p/u.  Will f/u with him next week.  Weight at clinic 185.  Vest reading-24  KMarylouise Stacks EMT-Paramedic 10/02/2016   ACTION: Home visit completed     chf

## 2016-10-02 NOTE — Patient Instructions (Addendum)
It was great to see you today!  Please STOP losartan.   Please START Entresto 24-26 mg TWICE DAILY.   Blood work today. We will call you with any changes.   Please keep your appointment with Jettie Booze, NP-C on 10/17/16.

## 2016-10-02 NOTE — Progress Notes (Signed)
HF MD: Haroldine Laws  HPI:  Andres Boyd a 61 y.o.African American malewith hx of non-obstructive CAD, HTN, ETOH and tobacco abuse, and HLD.   Pt previously admitted 07/03/2016 wth sharp chest pain. At this time was drinking >8 ETOH beverages each night and attributed it to GERD. Trop peaked at 0.31. UDS + for marijuana and opioids. Felt to be related to ETOH intake, but Nuclear stress showed possible inferior ischemia. Cath showed moderated non-obstructive disease as below.   Pt presented to [facility] 09/05/16 with severe midsternal chest pain with radiation to right and left chest, right back, and abdomen. No SOB. Associated with diaphoresis and N/V. Given SL NTG x 3 with no relief. Troponin negative so sent home. He continued to worsen throughout the day, so returned to ED. Recheck Troponin 0.31. Cardiology consulted. Pertinent labs on admission include K 4.0, Creatinine 1.22, Troponin peaked at 3.42 on 9.5.18. D-dimer negative.   Repeat cath 09/06/16 as below with no change to CAD from 07/2016. Echo with significant drop in EF since July (50-55% -> 25-30%). cMRI with LGE uptake suggestive of myocarditis vs infiltrative CMP. PYP scan without evidence of TTR amyloid. He was diuresed 7 pounds and discharged on 09/10/16. Discharge weight was 171 pounds. Started on losartan, Coreg, Spiro.   He presents today for pharmacist-led HF medication titration. At last HF clinic visit on 09/19/16, losartan was increased to 25 mg daily and digoxin 0.125 mg daily was started. Walking about 1 mile to and from the store every day. Taking all medications as prescribed. Katie with paramedicine has started seeing him but his brother fills his pillbox for him weekly. Has not drank any beer recently but still smoking 1-2 cigarettes twice weekly.     . Shortness of breath/dyspnea on exertion? no  . Orthopnea/PND? no . Edema? no . Lightheadedness/dizziness? no . Daily weights at home? Yes - stable ~178 lb  . Blood pressure/heart  rate monitoring at home? no . Following low-sodium/fluid-restricted diet? Yes - attempting to stay <2L fluid per day  HF Medications: Carvedilol 3.125 mg PO BID Digoxin 0.125 mg PO daily Losartan 25 mg PO QHS Spironolactone 25 mg PO daily   Has the patient been experiencing any side effects to the medications prescribed?  no  Does the patient have any problems obtaining medications due to transportation or finances?   No - [place] Medicaid  Understanding of regimen: fair Understanding of indications: fair Potential of compliance: good Patient understands to avoid NSAIDs. Patient understands to avoid decongestants.    Pertinent Lab Values: . 10/02/16: Serum creatinine 1.17, BUN 18, Potassium 138, Sodium 138, Digoxin <0.2   Vital Signs: . Weight: 185 lb (last clinic visit weight: 183 lb) . Blood pressure: 116/70 mmHg  . Heart rate: 57 bpm        ReDS Vest - 10/02/16 1300      ReDS Vest   MR  No   Estimated volume prior to reading Med   Fitting Posture Standing   Height Marker Tall   Ruler Value 11.5   Center Strip Aligned   ReDS Value 24       Assessment: 1. Chronicsystolic CHF (EF 69-62%), due to NICM (likely viral). NYHA class IIsymptoms.  - Volume status stable  - Replace losartan with Entresto 24-26 mg BID  - Continue carvedilol 3.125 mg BID, digoxin 0.125 mg daily, and spironolactone 25 mg daily   - We did discuss the possibility of the addition of Bidil to his regimen for its benefits in  African American patients but will optimize current regimen first  - Basic disease state pathophysiology, medication indication, mechanism and side effects reviewed at length with patient and he verbalized understanding 2. Non obstructive CAD - Continue ASA and atorvastatin - Denies chest pain 3. HTN - Well controlled on current regimen 4. ETOH/tobacco abuse  - He has reduced his ETOH intake greatly. Congratulated him - Will attempt nicotine patches for smoking  cessation  Plan: 1) Medication changes: Based on clinical presentation, vital signs and recent labs will switch from losartan to Entresto 24-26 mg BID 2) Labs: BMET/Digoxin level today and repeat BMET at next visit 3) Follow-up: 10/17/16 with Jettie Booze, NP-C   Ruta Hinds. Velva Harman, PharmD, BCPS, CPP Clinical Pharmacist Pager: [phone] Phone: [phone] 10/02/2016 12:52 PM   Agree  Glori Bickers, MD  5:17 PM

## 2016-10-16 ENCOUNTER — Telehealth (HOSPITAL_COMMUNITY): Payer: Self-pay | Admitting: Pharmacist

## 2016-10-16 NOTE — Telephone Encounter (Signed)
Entresto PA approved by [place] [employer] through 09/27/17.  Ruta Hinds. Velva Harman, PharmD, BCPS, CPP Clinical Pharmacist Pager: [phone] Phone: [phone] 10/16/2016 9:26 AM

## 2016-10-17 ENCOUNTER — Other Ambulatory Visit (HOSPITAL_COMMUNITY): Payer: Self-pay

## 2016-10-17 ENCOUNTER — Ambulatory Visit (HOSPITAL_COMMUNITY)
Admission: RE | Admit: 2016-10-17 | Discharge: 2016-10-17 | Disposition: A | Discharge status: Home / Self Care | Payer: Medicaid Other | Source: Ambulatory Visit | Attending: Internal Medicine | Admitting: Internal Medicine

## 2016-10-17 VITALS — BP 112/76 | HR 59 | Wt 187.6 lb

## 2016-10-17 DIAGNOSIS — Z7982 Long term (current) use of aspirin: Secondary | ICD-10-CM | POA: Diagnosis not present

## 2016-10-17 DIAGNOSIS — I429 Cardiomyopathy, unspecified: Secondary | ICD-10-CM | POA: Insufficient documentation

## 2016-10-17 DIAGNOSIS — F17210 Nicotine dependence, cigarettes, uncomplicated: Secondary | ICD-10-CM | POA: Insufficient documentation

## 2016-10-17 DIAGNOSIS — Z79899 Other long term (current) drug therapy: Secondary | ICD-10-CM | POA: Insufficient documentation

## 2016-10-17 DIAGNOSIS — I110 Hypertensive heart disease with heart failure: Secondary | ICD-10-CM | POA: Diagnosis present

## 2016-10-17 DIAGNOSIS — Z8249 Family history of ischemic heart disease and other diseases of the circulatory system: Secondary | ICD-10-CM | POA: Insufficient documentation

## 2016-10-17 DIAGNOSIS — I428 Other cardiomyopathies: Secondary | ICD-10-CM

## 2016-10-17 DIAGNOSIS — F1010 Alcohol abuse, uncomplicated: Secondary | ICD-10-CM | POA: Insufficient documentation

## 2016-10-17 DIAGNOSIS — I10 Essential (primary) hypertension: Secondary | ICD-10-CM

## 2016-10-17 DIAGNOSIS — I2510 Atherosclerotic heart disease of native coronary artery without angina pectoris: Secondary | ICD-10-CM | POA: Diagnosis not present

## 2016-10-17 DIAGNOSIS — I5022 Chronic systolic (congestive) heart failure: Secondary | ICD-10-CM | POA: Diagnosis not present

## 2016-10-17 DIAGNOSIS — Z833 Family history of diabetes mellitus: Secondary | ICD-10-CM | POA: Insufficient documentation

## 2016-10-17 DIAGNOSIS — F17200 Nicotine dependence, unspecified, uncomplicated: Secondary | ICD-10-CM

## 2016-10-17 NOTE — Progress Notes (Signed)
Advanced Heart Failure Clinic Note    Primary Care: No PCP Primary Cardiologist: Dr. Marlou Porch, Dr. Haroldine Laws  HPI: Andres Boyd is a 61 y.o. male with hx of non-obstructive CAD, HTN, ETOH and tobacco abuse, and HLD.   Pt previously admitted 07/03/2016 wth sharp chest pain. At this time was drinking > 8 ETOH beverages each night and attributed it to GERD. Trop peaked at 0.31. UDS + for marijuana and opioids. Felt to be related to ETOH intake, but Nuclear stress showed possible inferior ischemia. Cath showed moderated non-obstructive disease as below.   Pt presented to [facility] 09/05/16 with severe midsternal chest pain with radiation to right and left chest, right back, and abdomen. No SOB. Associated with diaphoresis and N/V. Given SL NTG x 3 with no relief. Troponin negative so sent home. He continued to worsen throughout the day, so returned to ED. Recheck Troponin 0.31. Cardiology consulted. Pertinent labs on admission include K 4.0, Creatinine 1.22, Troponin peaked at 3.42 on 9.5.18. D-dimer negative.   Repeat cath 09/06/16 as below with no change to CAD from 07/2016. Echo with significant drop in EF since July (50-55% -> 25-30%). cMRI with LGE uptake suggestive of myocarditis vs infiltrative CMP. PYP scan without evidence of TTR amyloid. He was diuresed 7 pounds and discharged on 09/10/16. Discharge weight was 171 pounds. Started on losartan, Coreg, Spiro.   Returns today for HF follow up. Weights at home are 180 pounds. No SOB with walking - walks a few miles a day. He does drink "a couple of beers a week". When I ask him to elaborate he is elusive. He smokes 2-3 cigarettes a day. He is taking all of his medications. Denies orthopnea, PND.   Review of Systems: [y] = yes, [ ]  = no   General: Weight gain Blue.Reese ]; Weight loss [ ] ; Anorexia [ ] ; Fatigue [ ] ; Fever [ ] ; Chills [ ] ; Weakness [ ]   Cardiac: Chest pain/pressure [ ] ; Resting SOB [ ] ; Exertional SOB [ ] ; Orthopnea [ ] ; Pedal Edema [ ] ; Palpitations  [ ] ; Syncope [ ] ; Presyncope [ ] ; Paroxysmal nocturnal dyspnea[ ]   Pulmonary: Cough [ ] ; Wheezing[ ] ; Hemoptysis[ ] ; Sputum [ ] ; Snoring [ ]   GI: Vomiting[ ] ; Dysphagia[ ] ; Melena[ ] ; Hematochezia [ ] ; Heartburn[ ] ; Abdominal pain [ ] ; Constipation [ ] ; Diarrhea [ ] ; BRBPR [ ]   GU: Hematuria[ ] ; Dysuria [ ] ; Nocturia[ ]   Vascular: Pain in legs with walking [ ] ; Pain in feet with lying flat [ ] ; Non-healing sores [ ] ; Stroke [ ] ; TIA [ ] ; Slurred speech [ ] ;  Neuro: Headaches[ ] ; Vertigo[ ] ; Seizures[ ] ; Paresthesias[ ] ;Blurred vision [ ] ; Diplopia [ ] ; Vision changes [ ]   Ortho/Skin: Arthritis Blue.Reese ]; Joint pain [ ] ; Muscle pain [ ] ; Joint swelling [ ] ; Back Pain [ ] ; Rash [ ]   Psych: Depression[ ] ; Anxiety[ ]   Heme: Bleeding problems [ ] ; Clotting disorders [ ] ; Anemia [ ]   Endocrine: Diabetes [ ] ; Thyroid dysfunction[ ]    Past Medical History:  Diagnosis Date  . 1st degree AV block   . Coronary artery disease   . ETOH abuse   . Hyperlipidemia   . Hypertension   . Myocardial infarct ([place]) 07/2015  . Tobacco abuse     Current Outpatient Prescriptions  Medication Sig Dispense Refill  . aspirin EC 81 MG EC tablet Take 1 tablet (81 mg total) by mouth daily. 30 tablet 1  . atorvastatin (LIPITOR) 20 MG tablet  Take 1 tablet (20 mg total) by mouth daily at 6 PM. 30 tablet 1  . carvedilol (COREG) 3.125 MG tablet Take 1 tablet (3.125 mg total) by mouth 2 (two) times daily. 60 tablet 1  . digoxin (LANOXIN) 0.125 MG tablet Take 1 tablet (0.125 mg total) by mouth daily. 90 tablet 3  . Multiple Vitamin (MULTIVITAMIN WITH MINERALS) TABS tablet Take 1 tablet by mouth daily.    . nitroGLYCERIN (NITROSTAT) 0.4 MG SL tablet Place 1 tablet (0.4 mg total) under the tongue every 5 (five) minutes as needed for chest pain. 90 tablet 3  . pantoprazole (PROTONIX) 40 MG tablet Take 1 tablet (40 mg total) by mouth daily. 30 tablet 1  . sacubitril-valsartan (ENTRESTO) 24-26 MG Take 1 tablet by mouth 2 (two) times  daily. 60 tablet 11  . spironolactone (ALDACTONE) 25 MG tablet Take 1 tablet (25 mg total) by mouth daily. 30 tablet 1  . nicotine (NICODERM CQ - DOSED IN MG/24 HOURS) 14 mg/24hr patch Place 1 patch (14 mg total) onto the skin daily. (Patient not taking: Reported on 10/17/2016) 28 patch 0   No current facility-administered medications for this encounter.     No Known Allergies    Social History   Social History  . Marital status: Single    Spouse name: N/A  . Number of children: N/A  . Years of education: N/A   Occupational History  . Not on file.   Social History Main Topics  . Smoking status: Current Every Day Smoker    Packs/day: 1.00    Years: 36.00    Types: Cigarettes  . Smokeless tobacco: Never Used  . Alcohol use 1.2 - 1.8 oz/week    2 - 3 Cans of beer per week     Comment: 2-3 32oz cans daily  . Drug use: Yes    Types: Marijuana  . Sexual activity: Not on file   Other Topics Concern  . Not on file   Social History Narrative   Patient unemployed. Lives in [place] with son.       Family History  Problem Relation Age of Onset  . Diabetes Mother   . Hypertension Mother   . Diabetes Father     Vitals:   10/17/16 1345  BP: 112/76  Pulse: (!) 59  SpO2: 99%  Weight: 187 lb 9.6 oz (85.1 kg)     PHYSICAL EXAM: General: Well appearing. No resp difficulty. HEENT: Normal Neck: Supple. JVP 5-6. Carotids 2+ bilat; no bruits. No thyromegaly or nodule noted. Cor: PMI nondisplaced. RRR, No M/G/R noted Lungs: CTAB, normal effort. Abdomen: Soft, non-tender, non-distended, no HSM. No bruits or masses. +BS  Extremities: No cyanosis, clubbing, or rash. R and LLE no edema.  Neuro: Alert & orientedx3, cranial nerves grossly intact. moves all 4 extremities w/o difficulty. Affect pleasant   ASSESSMENT & PLAN:  1. Chronic systolic CHF: NICM, degree of CAD out of proportion with CM. cMRI 09/07/16 with LVEF 36%, LGE in basal and mid anterior wall. PYP scan without  TTR amyloid. 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 Echo 09/2016 EF 25-30%.  - NYHA II - Volume stable on exam.  - Continue Entresto 24/26 mg BID. Will not increase with soft BP.  - Continue Spiro 25 mg daily - Continue Coreg 3.125 mg BID. HR is 59. Will keep same dose.  - Continue digoxin 0.125 mg daily. Recent dig level low.  - continue paramedicine.   2. Non obstructive CAD - Denies chest pain - Continue statin  and ASA.   3. HTN - Well controlled on current regime.   4. ETOH abuse  - Encouraged complete cessation.   Follow up in 3 months with an Echo.     Arbutus Leas, NP 10/17/16

## 2016-10-17 NOTE — Progress Notes (Signed)
Created in error

## 2016-10-17 NOTE — Patient Instructions (Signed)
Your physician has requested that you have an echocardiogram. Echocardiography is a painless test that uses sound waves to create images of your heart. It provides your doctor with information about the size and shape of your heart and how well your heart's chambers and valves are working. This procedure takes approximately one hour. There are no restrictions for this procedure.  Your physician recommends that you schedule a follow-up appointment in: 3 months  

## 2016-10-18 ENCOUNTER — Other Ambulatory Visit (HOSPITAL_COMMUNITY): Payer: Self-pay

## 2016-10-18 NOTE — Progress Notes (Signed)
Paramedicine Encounter    Patient ID: Andres Boyd, male    DOB: [birth year], 61 y.o.   MRN: [id number]   Patient Care Team: Patient, No Pcp Per as PCP - General (General Practice)  Patient Active Problem List   Diagnosis Date Noted  . AKI (acute kidney injury) ([place])   . Hyponatremia   . Acute systolic CHF (congestive heart failure) ([place])   . Prolonged Q-T interval on ECG 09/06/2016  . Atypical chest pain   . Elevated troponin   . AV block, 1st degree 07/02/2016  . Chest pain 02/10/2016  . Claudication ([place]) 02/10/2016  . Hypotension 07/21/2015  . History of prolonged Q-T interval on ECG 07/21/2015  . Alcohol abuse 07/21/2015  . Coronary artery disease 07/20/2015  . HLD (hyperlipidemia) 07/19/2015  . Hypertension 07/19/2015  . Tobacco use disorder 07/19/2015  . NSTEMI (non-ST elevated myocardial infarction) ([place]) 07/18/2015    Current Outpatient Prescriptions:  .  aspirin EC 81 MG EC tablet, Take 1 tablet (81 mg total) by mouth daily., Disp: 30 tablet, Rfl: 1 .  atorvastatin (LIPITOR) 20 MG tablet, Take 1 tablet (20 mg total) by mouth daily at 6 PM., Disp: 30 tablet, Rfl: 1 .  carvedilol (COREG) 3.125 MG tablet, Take 1 tablet (3.125 mg total) by mouth 2 (two) times daily., Disp: 60 tablet, Rfl: 1 .  digoxin (LANOXIN) 0.125 MG tablet, Take 1 tablet (0.125 mg total) by mouth daily., Disp: 90 tablet, Rfl: 3 .  nicotine (NICODERM CQ - DOSED IN MG/24 HOURS) 14 mg/24hr patch, Place 1 patch (14 mg total) onto the skin daily., Disp: 28 patch, Rfl: 0 .  pantoprazole (PROTONIX) 40 MG tablet, Take 1 tablet (40 mg total) by mouth daily., Disp: 30 tablet, Rfl: 1 .  sacubitril-valsartan (ENTRESTO) 24-26 MG, Take 1 tablet by mouth 2 (two) times daily., Disp: 60 tablet, Rfl: 11 .  spironolactone (ALDACTONE) 25 MG tablet, Take 1 tablet (25 mg total) by mouth daily., Disp: 30 tablet, Rfl: 1 .  Multiple Vitamin (MULTIVITAMIN WITH MINERALS) TABS tablet, Take 1 tablet by mouth daily., Disp: , Rfl:  .   nitroGLYCERIN (NITROSTAT) 0.4 MG SL tablet, Place 1 tablet (0.4 mg total) under the tongue every 5 (five) minutes as needed for chest pain. (Patient not taking: Reported on 10/18/2016), Disp: 90 tablet, Rfl: 3 No Known Allergies   Social History   Social History  . Marital status: Single    Spouse name: N/A  . Number of children: N/A  . Years of education: N/A   Occupational History  . Not on file.   Social History Main Topics  . Smoking status: Current Every Day Smoker    Packs/day: 1.00    Years: 36.00    Types: Cigarettes  . Smokeless tobacco: Never Used  . Alcohol use 1.2 - 1.8 oz/week    2 - 3 Cans of beer per week     Comment: 2-3 32oz cans daily  . Drug use: Yes    Types: Marijuana  . Sexual activity: Not on file   Other Topics Concern  . Not on file   Social History Narrative   Patient unemployed. Lives in [place] with son.     Physical Exam      No future appointments. BP (!) 108/50   Pulse (!) 58   Resp 16   Wt 180 lb (81.6 kg)   SpO2 98%   BMI 26.58 kg/m  Weight yesterday-187 Last visit weight-178  Pt reports he is feeling good,  he states he needs to change his PCP to one that is closer by since transportation is an issue for him.  His digoxin was called in to [facility] and needs to be moved over to [place] [facility]--[phone] to get dig moved over and also to ask about his nicotine patches. meds verified and pill box was done by his son. Pt denies any dizziness, no sob. States he is trying to limit his sodium intake. Trying to stay below the fluid limit, sometimes goes over but that's seldom. No missed doses.   ACTION: Home visit completed  Marylouise Stacks, EMT-Paramedic 10/18/16

## 2016-11-02 ENCOUNTER — Other Ambulatory Visit (HOSPITAL_COMMUNITY): Payer: Self-pay

## 2016-11-02 NOTE — Progress Notes (Signed)
Paramedicine Encounter    Patient ID: Andres Boyd, male    DOB: Jul 17, 1955, 61 y.o.   MRN: [id number]   Patient Care Team: Patient, No Pcp Per as PCP - General (General Practice)  Patient Active Problem List   Diagnosis Date Noted  . AKI (acute kidney injury) ([place])   . Hyponatremia   . Acute systolic CHF (congestive heart failure) ([place])   . Prolonged Q-T interval on ECG 09/06/2016  . Atypical chest pain   . Elevated troponin   . AV block, 1st degree 07/02/2016  . Chest pain 02/10/2016  . Claudication ([employer]) 02/10/2016  . Hypotension 07/21/2015  . History of prolonged Q-T interval on ECG 07/21/2015  . Alcohol abuse 07/21/2015  . Coronary artery disease 07/20/2015  . HLD (hyperlipidemia) 07/19/2015  . Hypertension 07/19/2015  . Tobacco use disorder 07/19/2015  . NSTEMI (non-ST elevated myocardial infarction) ([place]) 07/18/2015    Current Outpatient Prescriptions:  .  aspirin EC 81 MG EC tablet, Take 1 tablet (81 mg total) by mouth daily., Disp: 30 tablet, Rfl: 1 .  atorvastatin (LIPITOR) 20 MG tablet, Take 1 tablet (20 mg total) by mouth daily at 6 PM., Disp: 30 tablet, Rfl: 1 .  carvedilol (COREG) 3.125 MG tablet, Take 1 tablet (3.125 mg total) by mouth 2 (two) times daily., Disp: 60 tablet, Rfl: 1 .  digoxin (LANOXIN) 0.125 MG tablet, Take 1 tablet (0.125 mg total) by mouth daily., Disp: 90 tablet, Rfl: 3 .  pantoprazole (PROTONIX) 40 MG tablet, Take 1 tablet (40 mg total) by mouth daily., Disp: 30 tablet, Rfl: 1 .  sacubitril-valsartan (ENTRESTO) 24-26 MG, Take 1 tablet by mouth 2 (two) times daily., Disp: 60 tablet, Rfl: 11 .  spironolactone (ALDACTONE) 25 MG tablet, Take 1 tablet (25 mg total) by mouth daily., Disp: 30 tablet, Rfl: 1 .  Multiple Vitamin (MULTIVITAMIN WITH MINERALS) TABS tablet, Take 1 tablet by mouth daily., Disp: , Rfl:  .  nicotine (NICODERM CQ - DOSED IN MG/24 HOURS) 14 mg/24hr patch, Place 1 patch (14 mg total) onto the skin daily. (Patient not taking:  Reported on 11/02/2016), Disp: 28 patch, Rfl: 0 .  nitroGLYCERIN (NITROSTAT) 0.4 MG SL tablet, Place 1 tablet (0.4 mg total) under the tongue every 5 (five) minutes as needed for chest pain. (Patient not taking: Reported on 10/18/2016), Disp: 90 tablet, Rfl: 3 No Known Allergies   Social History   Social History  . Marital status: Single    Spouse name: N/A  . Number of children: N/A  . Years of education: N/A   Occupational History  . Not on file.   Social History Main Topics  . Smoking status: Current Every Day Smoker    Packs/day: 1.00    Years: 36.00    Types: Cigarettes  . Smokeless tobacco: Never Used  . Alcohol use 1.2 - 1.8 oz/week    2 - 3 Cans of beer per week     Comment: 2-3 32oz cans daily  . Drug use: Yes    Types: Marijuana  . Sexual activity: Not on file   Other Topics Concern  . Not on file   Social History Narrative   Patient unemployed. Lives in [place] with son.     Physical Exam      No future appointments. BP 106/64   Pulse 64   Resp 15   Wt 182 lb (82.6 kg)   SpO2 98%   BMI 26.88 kg/m  Weight yesterday-182 Last visit weight-180  Pt reports  that he is feeling pretty good. Pt denies any sob, no dizziness, no h/a, no c/p.  Pt states he is sleeping good throughout the night. Pt has not p/u the nicotine patches yet. He isnt ready to quit smoking. Pt states he missed only one evening dose this week due to him going to sleep early. Pt states he is trying to follow the low sodium diet and limit his fluid intake.  Still strongly encouraged him to stop smoking-offered any assistance with that and he advised he just needed to make that decision to do it. Pt does a lot of walking and gets a lot of exercise. He has a goal to get the patches and will work on the smoking.   ACTION: Home visit completed  Marylouise Stacks, EMT-Paramedic 11/02/16

## 2016-11-14 ENCOUNTER — Telehealth (HOSPITAL_COMMUNITY): Payer: Self-pay

## 2016-11-14 NOTE — Telephone Encounter (Signed)
Called Mr. Fairbank to sch visit this week however no answer and VM left.

## 2016-12-27 ENCOUNTER — Other Ambulatory Visit (HOSPITAL_COMMUNITY): Payer: Self-pay

## 2016-12-27 NOTE — Progress Notes (Signed)
GP:[id number] and graduate and d/c pt from program  I called pt to see if he was avail for me to come out for fina visit tomor, he reports he is busy and has an apointment with new PCP. I have been trying to meet up with him for several wks to do this however his sch hasnt worked out to do that. After speaking to him on phone he is comfortable with being d/c at this time. He has the means and tools to get what he needs and advised him should he feel he needs this service again to contact myself or the clinic to be re-established. He is very appreciative for the help and services provided.   Ashtyn Freilich-Paramedic 12/27/16

## 2016-12-28 ENCOUNTER — Telehealth (HOSPITAL_COMMUNITY): Payer: Self-pay | Admitting: Surgery

## 2016-12-28 NOTE — Telephone Encounter (Signed)
Mr. Andres Boyd has met all requirements for successful "graduation" from [place].  He will be discharged at this time from the program.  He will continue to follow in the [facility].

## 2016-12-29 ENCOUNTER — Ambulatory Visit (INDEPENDENT_AMBULATORY_CARE_PROVIDER_SITE_OTHER): Payer: Medicaid Other | Admitting: Family Medicine

## 2016-12-29 ENCOUNTER — Encounter: Payer: Self-pay | Admitting: Family Medicine

## 2016-12-29 ENCOUNTER — Other Ambulatory Visit: Payer: Self-pay

## 2016-12-29 VITALS — BP 110/70 | HR 74 | Temp 98.6°F | Ht 69.0 in | Wt 196.0 lb

## 2016-12-29 DIAGNOSIS — Z23 Encounter for immunization: Secondary | ICD-10-CM | POA: Diagnosis not present

## 2016-12-29 DIAGNOSIS — I10 Essential (primary) hypertension: Secondary | ICD-10-CM | POA: Diagnosis not present

## 2016-12-29 DIAGNOSIS — E782 Mixed hyperlipidemia: Secondary | ICD-10-CM

## 2016-12-29 DIAGNOSIS — I5021 Acute systolic (congestive) heart failure: Secondary | ICD-10-CM

## 2016-12-29 DIAGNOSIS — F1010 Alcohol abuse, uncomplicated: Secondary | ICD-10-CM | POA: Diagnosis not present

## 2016-12-29 DIAGNOSIS — F17200 Nicotine dependence, unspecified, uncomplicated: Secondary | ICD-10-CM

## 2016-12-29 DIAGNOSIS — Z9229 Personal history of other drug therapy: Secondary | ICD-10-CM

## 2016-12-29 NOTE — Progress Notes (Signed)
Subjective: Chief Complaint  Patient presents with  . New Patient (Initial Visit)     HPI: Andres Boyd is a 61 y.o. presenting to clinic today to discuss the following:  1 New diagnosis of HFrEF 2 HTN 3 HLD 4 Tobacco use disorder 5 ETOH use disorder  HFrEF: Patient is here to establish care as a new patient. He was recently diagnosed with HFrEF and is being seen by Cardiology regularly. He reports no chest pain today, no SOB, no PND, and no orthopnea needing only one pillow at night. He has no difficulty obtaining or taking his medications and reports no side effects.  HTN: Patient is currently at goal and taking medications as directed.  HLD: Patient last lipid panel was wnl and he is trying to eat non-fried, low fat foods. Walks 1.5-2 miles every day for exercise. He is taking atorvastatin 20mg  daily and has no problems taking this medication.  Tobacco use Disorder: He is a current smoker with a 1/2ppd for 37 years history. He is wanting to quit and is currently using Nicorette gum. He does not want to use the patch and he just recently started with the gum. His goal is to at least cut down to 4-5 cigarettes in 3 months.  ETOH use Disorder: He reports drinking 6 beers per week with only 2 at the most at one time. He states he may have 2 beers 3 times per week and sometimes not even that much. He denies any liquor or other alcoholic beverages. CAGE questions were all negative.  Denies fever, chills, chest pain, SOB, PND, orthopnea, nausea, vomiting, abdominal pain, difficulty urinating, blood in the stool, or irregular bowel movements.  Health Maintenance: TDAP vaccination. Received his influenza vaccine 2 months ago at [facility]. Has also been screened for HIV, Hep C, and reports colonoscopy within the past year.     ROS noted in HPI.   Past Medical, Surgical, Social, and Family History Reviewed & Updated per EMR.   Pertinent Historical Findings include:   Social History     Tobacco Use  Smoking Status Current Every Day Smoker  . Packs/day: 1.00  . Years: 36.00  . Pack years: 36.00  . Types: Cigarettes  Smokeless Tobacco Never Used      Objective: BP 110/70   Pulse 74   Temp 98.6 F (37 C) (Oral)   Ht 5\' 9"  (1.753 m)   Wt 196 lb (88.9 kg)   SpO2 98%   BMI 28.94 kg/m  Vitals and nursing notes reviewed  Physical Exam  Constitutional: He is oriented to person, place, and time and well-developed, well-nourished, and in no distress. No distress.  HENT:  Head: Normocephalic and atraumatic.  Right Ear: External ear normal.  Left Ear: External ear normal.  Nose: Nose normal.  Mouth/Throat: Oropharynx is clear and moist. No oropharyngeal exudate.  Eyes: Conjunctivae and EOM are normal. No scleral icterus.  Neck: Normal range of motion. Neck supple. No JVD present. No tracheal deviation present. No thyromegaly present.  Cardiovascular: Normal rate, regular rhythm, normal heart sounds and intact distal pulses. Exam reveals no gallop.  No murmur heard. Pulmonary/Chest: Effort normal and breath sounds normal. No respiratory distress. He has no wheezes.  Abdominal: Soft. Bowel sounds are normal. He exhibits no distension. There is no tenderness. There is no guarding.  Musculoskeletal: Normal range of motion. He exhibits no edema.  Neurological: He is alert and oriented to person, place, and time. No cranial nerve deficit.  Skin: Skin is warm and dry. No rash noted. No erythema.     No results found for this or any previous visit (from the past 72 hour(s)).  Assessment/Plan:  Acute systolic CHF (congestive heart failure) ([place]) Patient is not currently experiencing any symptoms of acute CHF exacerbation and is on optimal medical therapy. He is being follow by Cardiology and is going to appointments regularly.  Cont Carvedilol 3.125mg  BID Cont Dig 0.125mg  daily Cont Entresto 24-26mg  BID Cont Spironolactone 25mg  daily  Hypertension Pt has BP at  goal today. His current regimen for his HFrEF is also treating his BP.   No changes to medications at this time.  Cont Carvedilol 3.125mg  BID, Cont Entresto 24-26mg  BID, cont Spironolactone 25mg  daily  HLD (hyperlipidemia) Last lipid panel done in hospital on 07/03/2016 and lipids were all within normal limits.  Cont Atorvastatin 20mg  daily. Will recheck lipids yearly as his cardiac history is concerning for CAD, MI, HFrEF.  Tobacco use disorder Patient is motivated to quit and is using nicorette gum to help with cravings.   Goal is to at least decrease smoking to 4-5 per day in 3 months.   Talked with patient at length about the health benefits he will gain if he stops smoking including helping his cardiovascular health, pulmonary health, and decreasing his risk of developing lung cancer  Alcohol abuse Patient has history of ETOH abuse but today does not endorse any tendencies that put him at risk for becoming alcohol dependent.  CAGE questions were all negative.  He is drinking only beer, at most 2 per night, at most 3 times per week.  Of note, his son is concerned with him and wrote a note that he may not be completely forthcoming about the amount that he is drinking. I did not get this sense from the patient but I thought it was worth documenting.     PATIENT EDUCATION PROVIDED: See AVS    Diagnosis and plan along with any newly prescribed medication(s) were discussed in detail with this patient today. The patient verbalized understanding and agreed with the plan. Patient advised if symptoms worsen return to clinic or ER.   Health Maintainance:   Orders Placed This Encounter  Procedures  . Tdap vaccine greater than or equal to 7yo IM    No orders of the defined types were placed in this encounter.    Harolyn Rutherford, DO 12/29/2016, 1:35 PM PGY-1, [facility]

## 2016-12-29 NOTE — Assessment & Plan Note (Signed)
Patient is not currently experiencing any symptoms of acute CHF exacerbation and is on optimal medical therapy. He is being follow by Cardiology and is going to appointments regularly.  Cont Carvedilol 3.125mg  BID Cont Dig 0.125mg  daily Cont Entresto 24-26mg  BID Cont Spironolactone 25mg  daily

## 2016-12-29 NOTE — Patient Instructions (Signed)
It was great to meet you today! Thank you for letting me participate in your care!  Today, we discussed your overall health. The most important thing you can change to positively effect your health is to stop smoking. I'm encouraged that you are ready to quit and are actively cutting back and using Nicorette gum. Our goal is to have at least cut down to 4-5 cigarettes per day in 3 months.  You are on optimal medical management for your congestive heart failure with reduced ejection fraction. Please continue taking your medications as directed and make sure to follow up with Cardiologist to be seen regularly.  You received your TDAP vaccination today.  Be well, Harolyn Rutherford, DO PGY-1, [facility]

## 2016-12-29 NOTE — Assessment & Plan Note (Signed)
Patient has history of ETOH abuse but today does not endorse any tendencies that put him at risk for becoming alcohol dependent.  CAGE questions were all negative.  He is drinking only beer, at most 2 per night, at most 3 times per week.  Of note, his son is concerned with him and wrote a note that he may not be completely forthcoming about the amount that he is drinking. I did not get this sense from the patient but I thought it was worth documenting.

## 2016-12-29 NOTE — Assessment & Plan Note (Signed)
Patient is motivated to quit and is using nicorette gum to help with cravings.   Goal is to at least decrease smoking to 4-5 per day in 3 months.   Talked with patient at length about the health benefits he will gain if he stops smoking including helping his cardiovascular health, pulmonary health, and decreasing his risk of developing lung cancer

## 2016-12-29 NOTE — Assessment & Plan Note (Signed)
Pt has BP at goal today. His current regimen for his HFrEF is also treating his BP.   No changes to medications at this time.  Cont Carvedilol 3.125mg  BID, Cont Entresto 24-26mg  BID, cont Spironolactone 25mg  daily

## 2016-12-29 NOTE — Assessment & Plan Note (Signed)
Last lipid panel done in hospital on 07/03/2016 and lipids were all within normal limits.  Cont Atorvastatin 20mg  daily. Will recheck lipids yearly as his cardiac history is concerning for CAD, MI, HFrEF.

## 2017-02-09 ENCOUNTER — Other Ambulatory Visit: Payer: Self-pay

## 2017-02-09 NOTE — Telephone Encounter (Signed)
Patient is receiving these medications through his Cardiologist. Please have patient call their office to have these medications refilled. Thank you and please let me know if there is anything else I can do.

## 2017-02-09 NOTE — Telephone Encounter (Signed)
Patient's son left message on nurse line that patient needs these meds refilled. Bottles state no refill. Danley Danker, RN [facility] RN)

## 2017-02-12 ENCOUNTER — Other Ambulatory Visit (HOSPITAL_COMMUNITY): Payer: Self-pay | Admitting: *Deleted

## 2017-02-12 MED ORDER — SACUBITRIL-VALSARTAN 24-26 MG PO TABS: 1.0000 | ORAL_TABLET | Freq: Two times a day (BID) | ORAL | 3 refills | Status: DC

## 2017-02-12 MED ORDER — DIGOXIN 125 MCG PO TABS: 0.1250 mg | ORAL_TABLET | Freq: Every day | ORAL | 3 refills | Status: DC

## 2017-02-12 MED ORDER — CARVEDILOL 3.125 MG PO TABS: 3.1250 mg | ORAL_TABLET | Freq: Two times a day (BID) | ORAL | 3 refills | Status: DC

## 2017-03-09 ENCOUNTER — Encounter: Payer: Self-pay | Admitting: Cardiology

## 2017-03-09 ENCOUNTER — Other Ambulatory Visit: Payer: Self-pay | Admitting: Cardiology

## 2017-03-09 ENCOUNTER — Ambulatory Visit (INDEPENDENT_AMBULATORY_CARE_PROVIDER_SITE_OTHER): Payer: Medicaid Other | Admitting: Cardiology

## 2017-03-09 VITALS — BP 98/70 | HR 60 | Ht 69.0 in | Wt 201.0 lb

## 2017-03-09 DIAGNOSIS — I10 Essential (primary) hypertension: Secondary | ICD-10-CM | POA: Diagnosis not present

## 2017-03-09 DIAGNOSIS — E782 Mixed hyperlipidemia: Secondary | ICD-10-CM | POA: Diagnosis not present

## 2017-03-09 DIAGNOSIS — I428 Other cardiomyopathies: Secondary | ICD-10-CM

## 2017-03-09 DIAGNOSIS — I5181 Takotsubo syndrome: Secondary | ICD-10-CM

## 2017-03-09 DIAGNOSIS — F17200 Nicotine dependence, unspecified, uncomplicated: Secondary | ICD-10-CM

## 2017-03-09 DIAGNOSIS — I214 Non-ST elevation (NSTEMI) myocardial infarction: Secondary | ICD-10-CM | POA: Diagnosis not present

## 2017-03-09 DIAGNOSIS — I5022 Chronic systolic (congestive) heart failure: Secondary | ICD-10-CM

## 2017-03-09 DIAGNOSIS — I739 Peripheral vascular disease, unspecified: Secondary | ICD-10-CM | POA: Diagnosis not present

## 2017-03-09 MED ORDER — NITROGLYCERIN 0.4 MG SL SUBL: 0.4000 mg | SUBLINGUAL_TABLET | SUBLINGUAL | 3 refills | Status: DC | PRN

## 2017-03-09 NOTE — Patient Instructions (Signed)
Medication Instructions:   Your physician recommends that you continue on your current medications as directed. Please refer to the Current Medication list given to you today.    Testing/Procedures:  Your physician has requested that you have an echocardiogram. Echocardiography is a painless test that uses sound waves to create images of your heart. It provides your doctor with information about the size and shape of your heart and how well your heart's chambers and valves are working. This procedure takes approximately one hour. There are no restrictions for this procedure.   Your physician has requested that you have a lower  extremity arterial duplex. This test is an ultrasound of the arteries in the legs or arms. It looks at arterial blood flow in the legs and arms. Allow one hour for Lower and Upper Arterial scans. There are no restrictions or special instructions    Follow-Up:  3 MONTHS WITH DR Meda Coffee OR WITH AN EXTENDER ON HER TEAM, SAME DAY AS DR NELSON IS IN THE OFFICE       If you need a refill on your cardiac medications before your next appointment, please call your pharmacy.

## 2017-03-09 NOTE — Progress Notes (Signed)
Cardiology Office Note    Date:  03/09/2017   ID:  Andres Boyd, DOB February 13, 1955, MRN [id number]  PCP:  Nuala Alpha, DO  Cardiologist: Dr. Meda Coffee  No chief complaint on file.   History of Present Illness:  Andres Boyd is a 62 y.o. male patient admitted with NSTEMI who underwent cardiac cath and only had mild plaque in mildly reduced EF, could be Takatsubo variant, medical management recommended. He also has hypertension, dyslipidemia started on lipitor, hyponatremia that improved and a prolonged QT with no dysrhythmias. Patient was hypertensive on admission but then developed orthostatic hypotension treated with IV fluids. He was not sent home on any medications though we had talked about sending him home on lisinopril 5 mg once daily. Patient was also smoking a pack of cigarettes a day since he was 62 years old and drinks 5-6 beers daily. Magnesium level is okay. Avoid QT prolonging drugs. No history of syncope or palpitations or family history of syncope or sudden death. 2-D echo EF 40-45%.  Patient didn't fill any meds after discharge because he couldn't afford it. HeHas no means of transportation and has to get his medications filled at [facility] that he could walk to. 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 His blood pressure is elevated today. He quit smoking for a couple days but is back to a pack per day. He says he is only drinking one beer a couple days a week.  02/10/2016 - patient is coming after 6 months, he has developed chest pain that are nonexertional in the last couple weeks, they would last seconds to minutes a sharp retrosternal and are not radiating anywhere. Patient alsoexertional shortness of breath. No fever chills no cough no lower extremity edema. He has also noticed significant claudications in both of his calves when walking longer distances. He continues to smoke about  half pack a day.  04/12/2016 - 2 months follow-up, the patient is doing well, he is tolerating all his medications including Imdur, he now has no chest pain. He also denies shortness of breath, lower extremity edema orthopnea or paroxysmal nocturnal dyspnea. He continues to smoke.  03/09/2017 - the patient is coming after almost a year, he has been noticing worsening dyspnea on exertion but no chest pain. No lower extremity edema orthopnea or proximal nocturnal dyspnea. He had repeat echocardiogram done in September 2018 that showed worsening LVEF from 50-55% to 25-30%. He was started on Entresto. She has also noticed tightness in his calves while walking. He denies palpitations other than when he exerts himself and no syncope.   Past Medical History:  Diagnosis Date  . 1st degree AV block   . Coronary artery disease   . ETOH abuse   . Hyperlipidemia   . Hypertension   . Myocardial infarct ([place]) 07/2015  . Tobacco abuse     Past Surgical History:  Procedure Laterality Date  . CARDIAC CATHETERIZATION N/A 07/19/2015   Procedure: Left Heart Cath and Coronary Angiography;  Surgeon: Jettie Booze, MD;  Location: [place] [facility];  Service: Cardiovascular;  Laterality: N/A;  . LEFT HEART CATH AND CORONARY ANGIOGRAPHY N/A 07/04/2016   Procedure: Left Heart Cath and Coronary Angiography;  Surgeon: Jettie Booze, MD;  Location: [place] [facility];  Service: Cardiovascular;  Laterality: N/A;  . LEFT HEART CATH AND CORONARY ANGIOGRAPHY N/A 09/06/2016   Procedure: LEFT HEART CATH AND CORONARY ANGIOGRAPHY;  Surgeon: Sherren Mocha, MD;  Location: [place] [facility];  Service: Cardiovascular;  Laterality: N/A;    Current Medications: Outpatient Medications Prior to Visit  Medication Sig Dispense Refill  . aspirin EC 81 MG EC tablet Take 1 tablet (81 mg total) by mouth daily. 30 tablet 1  . atorvastatin (LIPITOR) 20 MG tablet Take 1 tablet (20 mg total) by mouth daily at 6 PM. 30 tablet 1    . carvedilol (COREG) 3.125 MG tablet Take 1 tablet (3.125 mg total) by mouth 2 (two) times daily. 60 tablet 3  . digoxin (LANOXIN) 0.125 MG tablet Take 1 tablet (0.125 mg total) by mouth daily. 90 tablet 3  . Multiple Vitamin (MULTIVITAMIN WITH MINERALS) TABS tablet Take 1 tablet by mouth daily.    . pantoprazole (PROTONIX) 40 MG tablet Take 1 tablet (40 mg total) by mouth daily. 30 tablet 1  . sacubitril-valsartan (ENTRESTO) 24-26 MG Take 1 tablet by mouth 2 (two) times daily. 60 tablet 3  . spironolactone (ALDACTONE) 25 MG tablet Take 1 tablet (25 mg total) by mouth daily. 30 tablet 1  . nitroGLYCERIN (NITROSTAT) 0.4 MG SL tablet Place 1 tablet (0.4 mg total) under the tongue every 5 (five) minutes as needed for chest pain. (Patient not taking: Reported on 10/18/2016) 90 tablet 3   No facility-administered medications prior to visit.      Allergies:   Patient has no known allergies.   Social History   Socioeconomic History  . Marital status: Single    Spouse name: None  . Number of children: None  . Years of education: None  . Highest education level: None  Social Needs  . Financial resource strain: None  . Food insecurity - worry: None  . Food insecurity - inability: None  . Transportation needs - medical: None  . Transportation needs - non-medical: None  Occupational History  . None  Tobacco Use  . Smoking status: Current Every Day Smoker    Packs/day: 1.00    Years: 36.00    Pack years: 36.00    Types: Cigarettes  . Smokeless tobacco: Never Used  Substance and Sexual Activity  . Alcohol use: Yes    Alcohol/week: 1.2 - 1.8 oz    Types: 2 - 3 Cans of beer per week    Comment: 2-3 32oz cans daily  . Drug use: Yes    Types: Marijuana  . Sexual activity: None  Other Topics Concern  . None  Social History Narrative   Patient unemployed. Lives in [place] with son.      Family History:  The patient's   family history includes Diabetes in his father, maternal  grandfather, maternal grandmother, and mother; Hypertension in his maternal grandfather, maternal grandmother, and mother.   ROS:   Please see the history of present illness.    Review of Systems  Constitution: Negative.  HENT: Negative.   Cardiovascular: Negative.   Respiratory: Positive for cough and shortness of breath.   Endocrine: Negative.   Hematologic/Lymphatic: Negative.   Musculoskeletal: Negative.   Gastrointestinal: Negative.   Genitourinary: Negative.   Neurological: Negative.    All other systems reviewed and are negative.  PHYSICAL EXAM:   VS:  BP 98/70 (BP Location: Left Arm, Patient Position: Sitting, Cuff Size: Normal)   Pulse 60   Ht 5\' 9"  (1.753 m)   Wt 201 lb (91.2 kg)   SpO2 97%   BMI 29.68 kg/m   Physical Exam  GEN: Well nourished, well developed, in  no acute distress   Neck: no JVD, carotid bruits, or masses Cardiac:RRR; Positive S4, no murmurs, rubs, or gallops  Respiratory:  Increased breath sounds with scattered rhonchi  GI: soft, nontender, nondistended, + BS Ext: Right arm at cath site without hematoma or hemorrhage, lower extremities without cyanosis, clubbing, or edema, poor distal pulses bilaterally MS: no deformity or atrophy  Skin: warm and dry, no rash Neuro:  Alert and Oriented x 3, Strength and sensation are intact Psych: euthymic mood, full affect  Wt Readings from Last 3 Encounters:  03/09/17 201 lb (91.2 kg)  12/29/16 196 lb (88.9 kg)  11/02/16 182 lb (82.6 kg)      Studies/Labs Reviewed:   EKG:  EKG is  ordered today.  The ekg ordered today demonstrates Sinus bradycardia with first-degree AV block at 54 bpm prolonged QT 422 ms, no acute change  Recent Labs: 09/06/2016: ALT 62 09/07/2016: Magnesium 2.3 09/08/2016: Hemoglobin 18.3; Platelets 271 10/02/2016: BUN 18; Creatinine, Ser 1.17; Potassium 4.1; Sodium 138   Lipid Panel    Component Value Date/Time   CHOL 120 07/03/2016 0402   CHOL 119 02/10/2016 1139   TRIG 86  07/03/2016 0402   HDL 49 07/03/2016 0402   HDL 42 02/10/2016 1139   CHOLHDL 2.4 07/03/2016 0402   VLDL 17 07/03/2016 0402   LDLCALC 54 07/03/2016 0402   LDLCALC 60 02/10/2016 1139    Additional studies/ records that were reviewed today include:  2-D echo 7/17/17Study Conclusions   - Left ventricle: There is akinesis of the basal and mid inferior   and inferoseptal walls. The cavity size was normal. There was   mild concentric hypertrophy. Systolic function was mildly to   moderately reduced. The estimated ejection fraction was in the   range of 40% to 45%. Doppler parameters are consistent with   abnormal left ventricular relaxation (grade 1 diastolic   dysfunction). There was no evidence of elevated ventricular   filling pressure by Doppler parameters. - Aortic valve: Trileaflet; normal thickness leaflets. There was no   regurgitation. - Mitral valve: Structurally normal valve. There was trivial   regurgitation. - Left atrium: The atrium was normal in size. - Right ventricle: Systolic function was normal. - Right atrium: The atrium was normal in size. - Tricuspid valve: There was trivial regurgitation. - Pulmonic valve: There was no regurgitation. - Pulmonary arteries: Systolic pressure was within the normal   range. - Inferior vena cava: The vessel was normal in size. - Pericardium, extracardiac: There was no pericardial effusion.   Impressions:   - CAD in the RCA territory.  Cardiac cath 07/19/15  Conclusion   Mid RCA lesion, 25% stenosed.  Ramus lesion, 25% stenosed.  Ost 2nd Diag to 2nd Diag lesion, 50% stenosed.  Prox Cx lesion, 25% stenosed.  There is mild left ventricular systolic dysfunction with hypokinesis noted at the base and normal contraction at the apex. Could be a Takatsubo variant.   No clear culprit lesion for elevated troponin.  Continue medical therapy including blood pressure control and tobacco cessation.     TTE: 09/07/2016 - Left  ventricle: LVEF is severely depressed with diffuse   hypokinesis, basal inferior/inferoseptal akinesis. The cavity   size was mildly dilated. Wall thickness was normal. Systolic   function was severely reduced. The estimated ejection fraction   was in the range of 25% to 30%. - Mitral valve: There was mild regurgitation.  Impressions:  - Compared to report of April 2018, LVEF is now depressed.  EKG performed today 03/09/2017 shows sinus bradycardia with first degree AV block otherwise normal EKG unchanged from prior, this was personally reviewed.    ASSESSMENT:    1. NSTEMI (non-ST elevated myocardial infarction) ([place])   2. NICM (nonischemic cardiomyopathy) ([place])   3. Tobacco use disorder   4. Takotsubo cardiomyopathy   5. Chronic systolic CHF (congestive heart failure) ([place])   6. Essential hypertension   7. Mixed hyperlipidemia   8. Claudication in peripheral vascular disease ([place])    PLAN:  In order of problems listed above:  1.  Nonischemic cardiomyopathy - - worsening LVEF from 5055% 25-30%, the patient was started on Spironolactone and Entresto - We will repeat echocardiogram now and if EF remains less than 35% we will referred for placement of an ICD, he doesn't qualify for biventricular pacemaker as his QRS is narrow. - Continue carvedilol  2. NSTEMI with only minimal nonobstructive CAD On cath in July 2018 at cath but some LV dysfunction could be Takatsubo variant. The other option includes coronary spasm.   3. Hypertension - controlled.  4. Hyperlipidemia - on lipitor that is well tolerated.  5. Claudications - with poor peripheral pulses, we'll schedule bilateral arterial duplex.  Medication Adjustments/Labs and Tests Ordered: Current medicines are reviewed at length with the patient today.  Concerns regarding medicines are outlined above.  Medication changes, Labs and Tests ordered today are listed in the Patient Instructions below. Patient Instructions    Medication Instructions:   Your physician recommends that you continue on your current medications as directed. Please refer to the Current Medication list given to you today.    Testing/Procedures:  Your physician has requested that you have an echocardiogram. Echocardiography is a painless test that uses sound waves to create images of your heart. It provides your doctor with information about the size and shape of your heart and how well your heart's chambers and valves are working. This procedure takes approximately one hour. There are no restrictions for this procedure.   Your physician has requested that you have a lower  extremity arterial duplex. This test is an ultrasound of the arteries in the legs or arms. It looks at arterial blood flow in the legs and arms. Allow one hour for Lower and Upper Arterial scans. There are no restrictions or special instructions    Follow-Up:  3 MONTHS WITH DR Meda Coffee OR WITH AN EXTENDER ON HER TEAM, SAME DAY AS DR Andres Boyd IS IN THE OFFICE       If you need a refill on your cardiac medications before your next appointment, please call your pharmacy.      Signed, Andres Dawley, MD  03/09/2017 12:10 PM    [facility] [address], [place], [place]  [address] Phone: [phone]; Fax: [phone]

## 2017-03-12 ENCOUNTER — Telehealth: Payer: Self-pay | Admitting: Cardiology

## 2017-03-12 DIAGNOSIS — I214 Non-ST elevation (NSTEMI) myocardial infarction: Secondary | ICD-10-CM

## 2017-03-12 MED ORDER — SPIRONOLACTONE 25 MG PO TABS: 25.0000 mg | ORAL_TABLET | Freq: Every day | ORAL | 3 refills | Status: DC

## 2017-03-12 MED ORDER — ATORVASTATIN CALCIUM 20 MG PO TABS: 20.0000 mg | ORAL_TABLET | Freq: Every day | ORAL | 3 refills | Status: DC

## 2017-03-12 MED ORDER — PANTOPRAZOLE SODIUM 40 MG PO TBEC: 40.0000 mg | DELAYED_RELEASE_TABLET | Freq: Every day | ORAL | 3 refills | Status: DC

## 2017-03-12 NOTE — Telephone Encounter (Signed)
Yes please refill.  We saw the pt last Friday with no med changes.  Please refill. Thanks

## 2017-03-12 NOTE — Telephone Encounter (Signed)
Pt's son is calling requesting a refill on atorvastatin, pantoprazole and spironolactone. Pt's son stated that pt was prescribed these medication in the hospital. Would Dr. Meda Coffee like to refill these medications? Pt's son would like a call back concerning this matter. Please address

## 2017-03-12 NOTE — Telephone Encounter (Signed)
Pt's medication was sent to pt's pharmacy as requested. Confirmation received.  °

## 2017-03-21 ENCOUNTER — Other Ambulatory Visit: Payer: Self-pay

## 2017-03-21 ENCOUNTER — Ambulatory Visit (HOSPITAL_COMMUNITY)
Admission: RE | Admit: 2017-03-21 | Discharge: 2017-03-21 | Disposition: A | Discharge status: Home / Self Care | Payer: Medicaid Other | Source: Ambulatory Visit | Attending: Cardiovascular Disease | Admitting: Cardiovascular Disease

## 2017-03-21 ENCOUNTER — Ambulatory Visit (HOSPITAL_BASED_OUTPATIENT_CLINIC_OR_DEPARTMENT_OTHER): Payer: Medicaid Other

## 2017-03-21 DIAGNOSIS — I5181 Takotsubo syndrome: Secondary | ICD-10-CM

## 2017-03-21 DIAGNOSIS — I10 Essential (primary) hypertension: Secondary | ICD-10-CM | POA: Diagnosis not present

## 2017-03-21 DIAGNOSIS — E782 Mixed hyperlipidemia: Secondary | ICD-10-CM

## 2017-03-21 DIAGNOSIS — I5030 Unspecified diastolic (congestive) heart failure: Secondary | ICD-10-CM | POA: Insufficient documentation

## 2017-03-21 DIAGNOSIS — I5022 Chronic systolic (congestive) heart failure: Secondary | ICD-10-CM | POA: Diagnosis not present

## 2017-03-21 DIAGNOSIS — I771 Stricture of artery: Secondary | ICD-10-CM | POA: Diagnosis not present

## 2017-03-21 DIAGNOSIS — R9439 Abnormal result of other cardiovascular function study: Secondary | ICD-10-CM | POA: Diagnosis not present

## 2017-03-21 DIAGNOSIS — I739 Peripheral vascular disease, unspecified: Secondary | ICD-10-CM | POA: Diagnosis not present

## 2017-03-21 DIAGNOSIS — F17200 Nicotine dependence, unspecified, uncomplicated: Secondary | ICD-10-CM | POA: Diagnosis present

## 2017-03-21 DIAGNOSIS — I214 Non-ST elevation (NSTEMI) myocardial infarction: Secondary | ICD-10-CM | POA: Diagnosis not present

## 2017-03-21 DIAGNOSIS — I428 Other cardiomyopathies: Secondary | ICD-10-CM | POA: Diagnosis not present

## 2017-03-21 DIAGNOSIS — I708 Atherosclerosis of other arteries: Secondary | ICD-10-CM | POA: Diagnosis not present

## 2017-05-02 ENCOUNTER — Other Ambulatory Visit (HOSPITAL_COMMUNITY): Payer: Self-pay | Admitting: *Deleted

## 2017-06-11 ENCOUNTER — Encounter: Payer: Self-pay | Admitting: Nurse Practitioner

## 2017-06-11 ENCOUNTER — Ambulatory Visit: Payer: Medicaid Other | Admitting: Nurse Practitioner

## 2017-06-11 ENCOUNTER — Encounter (INDEPENDENT_AMBULATORY_CARE_PROVIDER_SITE_OTHER): Payer: Self-pay

## 2017-06-11 VITALS — BP 118/70 | HR 60 | Ht 69.0 in | Wt 199.0 lb

## 2017-06-11 DIAGNOSIS — I2510 Atherosclerotic heart disease of native coronary artery without angina pectoris: Secondary | ICD-10-CM | POA: Diagnosis not present

## 2017-06-11 DIAGNOSIS — I428 Other cardiomyopathies: Secondary | ICD-10-CM

## 2017-06-11 DIAGNOSIS — I10 Essential (primary) hypertension: Secondary | ICD-10-CM | POA: Diagnosis not present

## 2017-06-11 DIAGNOSIS — E782 Mixed hyperlipidemia: Secondary | ICD-10-CM | POA: Diagnosis not present

## 2017-06-11 DIAGNOSIS — I5022 Chronic systolic (congestive) heart failure: Secondary | ICD-10-CM | POA: Diagnosis not present

## 2017-06-11 LAB — BASIC METABOLIC PANEL
BUN/Creatinine Ratio: 14 (ref 10–24)
BUN: 18 mg/dL (ref 8–27)
CO2: 21 mmol/L (ref 20–29)
Calcium: 9.4 mg/dL (ref 8.6–10.2)
Chloride: 101 mmol/L (ref 96–106)
Creatinine, Ser: 1.29 mg/dL — ABNORMAL HIGH (ref 0.76–1.27)
GFR calc Af Amer: 68 mL/min/{1.73_m2} (ref >59)
GFR calc non Af Amer: 59 mL/min/{1.73_m2} — ABNORMAL LOW (ref >59)
Glucose: 107 mg/dL — ABNORMAL HIGH (ref 65–99)
Potassium: 4.7 mmol/L (ref 3.5–5.2)
Sodium: 136 mmol/L (ref 134–144)

## 2017-06-11 LAB — LIPID PANEL
Chol/HDL Ratio: 3.5 ratio (ref 0.0–5.0)
Cholesterol, Total: 142 mg/dL (ref 100–199)
HDL: 41 mg/dL (ref >39)
LDL Calculated: 66 mg/dL (ref 0–99)
Triglycerides: 173 mg/dL — ABNORMAL HIGH (ref 0–149)
VLDL Cholesterol Cal: 35 mg/dL (ref 5–40)

## 2017-06-11 LAB — HEPATIC FUNCTION PANEL
ALT: 33 IU/L (ref 0–44)
AST: 29 IU/L (ref 0–40)
Albumin: 4.4 g/dL (ref 3.6–4.8)
Alkaline Phosphatase: 55 IU/L (ref 39–117)
Bilirubin Total: 0.3 mg/dL (ref 0.0–1.2)
Bilirubin, Direct: 0.11 mg/dL (ref 0.00–0.40)
Total Protein: 8 g/dL (ref 6.0–8.5)

## 2017-06-11 LAB — DIGOXIN LEVEL: Digoxin, Serum: 0.4 ng/mL — ABNORMAL LOW (ref 0.5–0.9)

## 2017-06-11 MED ORDER — CARVEDILOL 3.125 MG PO TABS: 3.1250 mg | ORAL_TABLET | Freq: Two times a day (BID) | ORAL | 3 refills | Status: DC

## 2017-06-11 MED ORDER — SACUBITRIL-VALSARTAN 24-26 MG PO TABS: 1.0000 | ORAL_TABLET | Freq: Two times a day (BID) | ORAL | 3 refills | Status: DC

## 2017-06-11 NOTE — Progress Notes (Signed)
CARDIOLOGY OFFICE NOTE  Date:  06/11/2017    Andres Boyd Date of Birth: April 02, 1955 Medical Record #[id number]  PCP:  Nuala Alpha, DO  Cardiologist:  Meda Coffee  Chief Complaint  Patient presents with  . Hyperlipidemia  . Hypertension  . Coronary Artery Disease  . Congestive Heart Failure    3 month check - seen for Dr. Meda Coffee    History of Present Illness: Andres Boyd is a 62 y.o. male who presents today for a 3 month check. Seen for Dr. Meda Coffee.   He has a history of tobacco use, HTN and HLD. Admitted in 2017 and had negative cath at that time.  He also has a history of hyponatremia and a prolonged QT with no dysrhythmias.  No history of syncope or palpitations or family history of syncope or sudden death.   He was readmitted to [facility] 07/02/16 for chest pain. Cardiac enzymes were +, with troponin peaking at 0.31. LDL was controlled with statin at 54 mg/dL. Hgb A1c 5.6. He first underwent a NST, as it was felt that his CP was atypical. This showed a large size, moderate intensity partially reversible inferior, inferoseptal apical inferior and apical perfusion defect (SDS 5) - suggestive of scar with mild peri-infarct ischemia. LVEF 52% with mild inferior hypokinesis. It was an intermediate risk study. Subsequently, he was referred for [facility]. This was performed by Dr. Irish Lack on 07/04/16 via the right radial artery. He was found to have mild nonobstructive CAD with 25% mid RCA, 25% RI, 50% ostial 2nd diag, 255 prox LCx. EF normal at 50-55%. LVEDP was normal. Continued medical therapy was recommended. Concern noted that this may be Takotasubo variant. He was unable to fill his medicines from that discharge due to cost/transportation.   He had repeat echocardiogram done in September 2018 that showed worsening LVEF from 50-55% to 25-30%. He was started on Entresto. He has continued to smoke.   Last seen by Dr. Meda Coffee back in March - was felt to be doing ok. Was to have repeat echo. He was on  aldactone and Entresto.  Echo showed that his EF had normalized again. Chronic sinus bradycardia noted.   Comes in today. Here alone. He feels like he is doing well. No chest pain. Not short of breath. Weight is stable. Not dizzy or lightheaded. Says he is smoking "less". Needs some refills and labs today. He took his medicines around 6:30 AM today. He really has no complaint and feels like he is doing well.    Past Medical History:  Diagnosis Date  . 1st degree AV block   . Coronary artery disease   . ETOH abuse   . Hyperlipidemia   . Hypertension   . Myocardial infarct ([place]) 07/2015  . Tobacco abuse     Past Surgical History:  Procedure Laterality Date  . CARDIAC CATHETERIZATION N/A 07/19/2015   Procedure: Left Heart Cath and Coronary Angiography;  Surgeon: Jettie Booze, MD;  Location: [place] [facility];  Service: Cardiovascular;  Laterality: N/A;  . LEFT HEART CATH AND CORONARY ANGIOGRAPHY N/A 07/04/2016   Procedure: Left Heart Cath and Coronary Angiography;  Surgeon: Jettie Booze, MD;  Location: [place] [facility];  Service: Cardiovascular;  Laterality: N/A;  . LEFT HEART CATH AND CORONARY ANGIOGRAPHY N/A 09/06/2016   Procedure: LEFT HEART CATH AND CORONARY ANGIOGRAPHY;  Surgeon: Sherren Mocha, MD;  Location: [place] [facility];  Service: Cardiovascular;  Laterality: N/A;     Medications: Current Meds  Medication Sig  . aspirin EC 81 MG EC tablet Take 1 tablet (81 mg total) by mouth daily.  Marland Kitchen atorvastatin (LIPITOR) 20 MG tablet Take 1 tablet (20 mg total) by mouth daily at 6 PM.  . carvedilol (COREG) 3.125 MG tablet Take 1 tablet (3.125 mg total) by mouth 2 (two) times daily.  . digoxin (LANOXIN) 0.125 MG tablet Take 1 tablet (0.125 mg total) by mouth daily.  . Multiple Vitamin (MULTIVITAMIN WITH MINERALS) TABS tablet Take 1 tablet by mouth daily.  . pantoprazole (PROTONIX) 40 MG tablet Take 1 tablet (40 mg total) by mouth daily.  . sacubitril-valsartan (ENTRESTO)  24-26 MG Take 1 tablet by mouth 2 (two) times daily.  Marland Kitchen spironolactone (ALDACTONE) 25 MG tablet Take 1 tablet (25 mg total) by mouth daily.  . [DISCONTINUED] carvedilol (COREG) 3.125 MG tablet Take 1 tablet (3.125 mg total) by mouth 2 (two) times daily.  . [DISCONTINUED] sacubitril-valsartan (ENTRESTO) 24-26 MG Take 1 tablet by mouth 2 (two) times daily.     Allergies: No Known Allergies  Social History: The patient  reports that he has been smoking cigarettes.  He has a 36.00 pack-year smoking history. He has never used smokeless tobacco. He reports that he drinks about 1.2 - 1.8 oz of alcohol per week. He reports that he has current or past drug history. Drug: Marijuana.   Family History: The patient's family history includes Diabetes in his father, maternal grandfather, maternal grandmother, and mother; Hypertension in his maternal grandfather, maternal grandmother, and mother.   Review of Systems: Please see the history of present illness.   Otherwise, the review of systems is positive for none.   All other systems are reviewed and negative.   Physical Exam: VS:  BP 118/70   Pulse 60   Ht 5\' 9"  (1.753 m)   Wt 199 lb (90.3 kg)   SpO2 97%   BMI 29.39 kg/m  .  BMI Body mass index is 29.39 kg/m.  Wt Readings from Last 3 Encounters:  06/11/17 199 lb (90.3 kg)  03/09/17 201 lb (91.2 kg)  12/29/16 196 lb (88.9 kg)    General: Pleasant. Well developed, well nourished and in no acute distress.   HEENT: Normal.  Neck: Supple, no JVD, carotid bruits, or masses noted.  Cardiac: Regular rate and rhythm. Heart tones are distant. No edema.  Respiratory:  Lungs are fairly clear to auscultation bilaterally with normal work of breathing.  GI: Soft and nontender.  MS: No deformity or atrophy. Gait and ROM intact.  Skin: Warm and dry. Color is normal.  Neuro:  Strength and sensation are intact and no gross focal deficits noted.  Psych: Alert, appropriate and with normal  affect.   LABORATORY DATA:  EKG:  EKG is not ordered today.   Lab Results  Component Value Date   WBC 6.6 09/08/2016   HGB 18.3 (H) 09/08/2016   HCT 51.0 09/08/2016   PLT 271 09/08/2016   GLUCOSE 84 10/02/2016   CHOL 120 07/03/2016   TRIG 86 07/03/2016   HDL 49 07/03/2016   LDLCALC 54 07/03/2016   ALT 62 09/06/2016   AST 57 (H) 09/06/2016   NA 138 10/02/2016   K 4.1 10/02/2016   CL 107 10/02/2016   CREATININE 1.17 10/02/2016   BUN 18 10/02/2016   CO2 27 10/02/2016   TSH 1.030 02/10/2016   INR 1.05 09/06/2016   HGBA1C 5.6 07/03/2016     BNP (last 3 results) No results for input(s): BNP in  the last 8760 hours.  ProBNP (last 3 results) No results for input(s): PROBNP in the last 8760 hours.   Other Studies Reviewed Today:  Echo Study Conclusions 03/2017  - Left ventricle: The cavity size was normal. Wall thickness was   normal. Systolic function was normal. The estimated ejection   fraction was in the range of 60% to 65%. Wall motion was normal;   there were no regional wall motion abnormalities. Doppler   parameters are consistent with abnormal left ventricular   relaxation (grade 1 diastolic dysfunction). - Aortic valve: There was no stenosis. - Mitral valve: There was trivial regurgitation. - Right ventricle: The cavity size was normal. Systolic function   was normal. - Tricuspid valve: Peak RV-RA gradient (S): 21 mm Hg. - Pulmonary arteries: PA peak pressure: 24 mm Hg (S). - Inferior vena cava: The vessel was normal in size. The   respirophasic diameter changes were in the normal range (>= 50%),   consistent with normal central venous pressure.  Impressions:  - Normal LV size with EF 60-65%. Normal wall motion. Normal RV size   and systolic function. No significant valvular abnormalities.     TTE: 09/07/2016 - Left ventricle: LVEF is severely depressed with diffuse hypokinesis, basal inferior/inferoseptal akinesis. The cavity size was mildly  dilated. Wall thickness was normal. Systolic function was severely reduced. The estimated ejection fraction was in the range of 25% to 30%. - Mitral valve: There was mild regurgitation.  Impressions:  - Compared to report of April 2018, LVEF is now depressed.  Cardiac cath 07/19/15  Conclusion   Mid RCA lesion, 25% stenosed.  Ramus lesion, 25% stenosed.  Ost 2nd Diag to 2nd Diag lesion, 50% stenosed.  Prox Cx lesion, 25% stenosed.  There is mild left ventricular systolic dysfunction with hypokinesis noted at the base and normal contraction at the apex. Could be a Takatsubo variant.  No clear culprit lesion for elevated troponin. Continue medical therapy including blood pressure control and tobacco cessation.     Assessment/Plan:  1. CAD with prior NSTEMI - to be managed medically. Doing well clinically. No changes made today.   2. NICM - his EF has normalized by recent echo. No changes made today. He is doing well clinically with NYHA I/II symptoms. Lab today.   3. Tobacco abuse - says he is smoking less - total abstinence recommended.  4. HLD - on statin - needs labs today  5. HTN - BP is great on current regimen. Lab today. No changes were made today.   6. PVD - medically treated - again, needs smoking cessation. No reports of claudication today.   Current medicines are reviewed with the patient today.  The patient does not have concerns regarding medicines other than what has been noted above.  The following changes have been made:  See above.  Labs/ tests ordered today include:    Orders Placed This Encounter  Procedures  . Basic metabolic panel  . Hepatic function panel  . Lipid panel  . Digoxin level     Disposition:   FU with Dr. Meda Coffee and her team in 6 months.    Patient is agreeable to this plan and will call if any problems develop in the interim.   SignedTruitt Merle, NP  06/11/2017 9:17 AM  [place] [address] [place], [place]  [address] Phone: [phone] Fax: [phone]

## 2017-06-11 NOTE — Patient Instructions (Addendum)
We will be checking the following labs today - BMET, lipids, HPF and dig level   Medication Instructions:    Continue with your current medicines.   I sent in a refill for the Coreg and the [facility] today   Call your pharmacy if you need other refills.     Testing/Procedures To Be Arranged:  N/A  Follow-Up:   See Dr. Meda Coffee in about 6 months.     Other Special Instructions:   Keep working on stopping smoking.     If you need a refill on your cardiac medications before your next appointment, please call your pharmacy.   Call the [place] office at [phone] if you have any questions, problems or concerns.

## 2017-10-09 ENCOUNTER — Telehealth: Payer: Self-pay | Admitting: Nurse Practitioner

## 2017-10-09 NOTE — Telephone Encounter (Signed)
New Message:     Son called and said pharmacist sya pt needs a prior authorization for his Entresto. Pt needs this asap, he is completely out of his Entresto.

## 2017-10-09 NOTE — Telephone Encounter (Signed)
Spoke with pharmacist at [facility] and asked him to try [facility] free trial voucher. Pharmacist attempted to use the voucher but patient has already used one this year. He states patient picked up #60 tabs on 09/17/17.  I spoke with patient's son, Elberta Fortis, who states patient has 6 tablets. I advised that our office will send a PA tomorrow and to call back with questions or concerns. Elberta Fortis thanked me for the call.

## 2017-10-10 ENCOUNTER — Telehealth: Payer: Self-pay

## 2017-10-10 NOTE — Telephone Encounter (Signed)
**Note De-Identified Raysean Graumann Obfuscation** See phone note from 10/10/17.

## 2017-10-10 NOTE — Telephone Encounter (Addendum)
I attempted to do an [place] PA over the phone with Eta at [facility] but because this is not the pts initial PA for [name] stated that I had to go to [employer] site and print an [place] PA request form.  I have completed the PA request form, obtained an Echo result from 2018 and one from 2019 to show improvement since the pt started [name], NP has signed the PA request form, and I have faxed all to [employer].  The pt is aware.

## 2017-10-19 ENCOUNTER — Encounter

## 2017-12-10 ENCOUNTER — Ambulatory Visit: Payer: Medicaid Other | Admitting: Physician Assistant

## 2017-12-16 ENCOUNTER — Encounter: Payer: Self-pay | Admitting: Physician Assistant

## 2017-12-16 NOTE — Progress Notes (Addendum)
Cardiology Office Note    Date:  12/18/2017  ID:  Andres Boyd, DOB 1955-08-25, MRN [id number] PCP:  Nuala Alpha, DO  Cardiologist:  Ena Dawley, MD   Chief Complaint: f/u CHF  History of Present Illness:  Andres Boyd is a 62 y.o. male with history of nonischemic cardiomyopathy, probable PAD by duplex 03/2017, nonobstructive CAD, alcohol abuse, sinus bradycardia, tobacco abuse, HTN, HLD, hyponatremia, CKD III, prolonged QT.   He has a history of NSTEMI in 07/2015 with peak troponin of troponin 2. He underwent cardiac cath and only had mild plaque in mildly reduced EF, felt to represent Takatsubo variant vs coronary spasm - medical management recommended. EF was 40-45%. He was readmitted to [facility] 7/18 for chest pain and elevated troponin. At that time he was drinking > 8 ETOH beverages each night. UDS was + for marijuana and opioids. Nuclear stress test was abnormal so he underwent cath showing mild nonobstructive CAD, EF was normal at 50-55% with some anterolateral hypokinesis. LVEDP was normal. He was readmitted 09/2016 with severe midsternal chest pain - initially sent home from ER for negative troponin then returned with persistent symptoms and troponin eventually peaking at 3.42. D-dimer was negative. Repeat cath 09/06/16 showed no change to mild CAD from 07/2016. Echo showed significant drop in EF since July (50-55% -> 25-30%), with cath showing EF 35-40%. cMRI with LGE uptake was suggestive of myocarditis vs infiltrative CMP. HIV, hep screen and iron studies were normal. Dr. Haroldine Laws evaluated the patient. PYP scan without evidence of TTR amyloid. 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 He had previously been unable to fill his medicines from that discharge due to cost/transportation. 2D echo since that time (03/2017) showed EF 60-65%, grade 1 DD, normal wall motion, normal RV. He also previously had LE duplex for claudication  03/2017 showing 30-49% stenosis in R popliteal and total occlusion in posterior tibial for which Dr. Meda Coffee recommended medical therapy. Last labs 06/2017 showed Cr 1.29, K 4.7, LFTs wnl, LDL 66, digoxin level 0.4, last CBC 09/2016 Hgb 18.3.  He returns for 6 month follow-up today doing well. He denies any cardiac complaints. No CP, SOB, orthopnea, PND, LEE, chest pain, dizziness or syncope. He is tolerating all meds without difficulty. He is still trying to cut down smoking. He also has cut down alcohol as well and no longer drinks daily, only a few drinks per week. No claudication. No slow-healing wounds reported.  Past Medical History:  Diagnosis Date  . 1st degree AV block   . CKD (chronic kidney disease), stage III ([place])   . ETOH abuse   . Hyperlipidemia   . Hypertension   . Hyponatremia   . Mild CAD   . NICM (nonischemic cardiomyopathy) ([place])   . PAD (peripheral artery disease) ([place])    a. LE vasc testing 03/2017 - LE duplex for claudication 03/2017 showing 30-49% stenosis in R popliteal and total occlusion in posterior tibial. Managed medically.  . Prolonged QT interval   . Sinus bradycardia   . Tobacco abuse     Past Surgical History:  Procedure Laterality Date  . CARDIAC CATHETERIZATION N/A 07/19/2015   Procedure: Left Heart Cath and Coronary Angiography;  Surgeon: Jettie Booze, MD;  Location: [place] [facility];  Service: Cardiovascular;  Laterality: N/A;  . LEFT HEART CATH AND CORONARY ANGIOGRAPHY N/A 07/04/2016   Procedure: Left Heart Cath and Coronary Angiography;  Surgeon: Jettie Booze, MD;  Location: [place] [facility];  Service: Cardiovascular;  Laterality: N/A;  . LEFT HEART CATH AND CORONARY ANGIOGRAPHY N/A 09/06/2016   Procedure: LEFT HEART CATH AND CORONARY ANGIOGRAPHY;  Surgeon: Sherren Mocha, MD;  Location: [place] [facility];  Service: Cardiovascular;  Laterality: N/A;    Current Medications: Current Meds  Medication Sig  . aspirin EC 81 MG EC tablet  Take 1 tablet (81 mg total) by mouth daily.  Marland Kitchen atorvastatin (LIPITOR) 20 MG tablet Take 1 tablet (20 mg total) by mouth daily at 6 PM.  . carvedilol (COREG) 3.125 MG tablet Take 1 tablet (3.125 mg total) by mouth 2 (two) times daily.  . digoxin (LANOXIN) 0.125 MG tablet Take 1 tablet (0.125 mg total) by mouth daily.  . Multiple Vitamin (MULTIVITAMIN WITH MINERALS) TABS tablet Take 1 tablet by mouth daily.  . nitroGLYCERIN (NITROSTAT) 0.4 MG SL tablet Place 1 tablet (0.4 mg total) under the tongue every 5 (five) minutes as needed for chest pain.  . pantoprazole (PROTONIX) 40 MG tablet Take 1 tablet (40 mg total) by mouth daily.  . sacubitril-valsartan (ENTRESTO) 24-26 MG Take 1 tablet by mouth 2 (two) times daily.  Marland Kitchen spironolactone (ALDACTONE) 25 MG tablet Take 1 tablet (25 mg total) by mouth daily.  . [DISCONTINUED] atorvastatin (LIPITOR) 20 MG tablet Take 1 tablet (20 mg total) by mouth daily at 6 PM.  . [DISCONTINUED] pantoprazole (PROTONIX) 40 MG tablet Take 1 tablet (40 mg total) by mouth daily.  . [DISCONTINUED] sacubitril-valsartan (ENTRESTO) 24-26 MG Take 1 tablet by mouth 2 (two) times daily.    Allergies:   Patient has no known allergies.   Social History   Socioeconomic History  . Marital status: Single    Spouse name: Not on file  . Number of children: Not on file  . Years of education: Not on file  . Highest education level: Not on file  Occupational History  . Not on file  Social Needs  . Financial resource strain: Not on file  . Food insecurity:    Worry: Not on file    Inability: Not on file  . Transportation needs:    Medical: Not on file    Non-medical: Not on file  Tobacco Use  . Smoking status: Current Every Day Smoker    Packs/day: 1.00    Years: 36.00    Pack years: 36.00    Types: Cigarettes  . Smokeless tobacco: Never Used  Substance and Sexual Activity  . Alcohol use: Yes    Alcohol/week: 2.0 - 3.0 standard drinks    Types: 2 - 3 Cans of beer per  week    Comment: 2-3 32oz cans daily  . Drug use: Yes    Types: Marijuana  . Sexual activity: Not on file  Lifestyle  . Physical activity:    Days per week: Not on file    Minutes per session: Not on file  . Stress: Not on file  Relationships  . Social connections:    Talks on phone: Not on file    Gets together: Not on file    Attends religious service: Not on file    Active member of club or organization: Not on file    Attends meetings of clubs or organizations: Not on file    Relationship status: Not on file  Other Topics Concern  . Not on file  Social History Narrative   Patient unemployed. Lives in [place] with son.      Family History:  The patient's family history includes Diabetes in his father, maternal grandfather, maternal grandmother, and mother; Hypertension in his maternal grandfather, maternal grandmother, and mother.  ROS:   Please see the history of present illness. He does report a "smokers cough" - this is not with recumbency or exertion. We discussed seeing PCP for this given tobacco history. All other systems are reviewed and otherwise negative.    PHYSICAL EXAM:   VS:  BP 110/74   Pulse (!) 56   Ht 5\' 9"  (1.753 m)   Wt 194 lb 12.8 oz (88.4 kg)   SpO2 96%   BMI 28.77 kg/m   BMI: Body mass index is 28.77 kg/m. GEN: Well nourished, well developed AAM in no acute distress HEENT: normocephalic, atraumatic Neck: no JVD, carotid bruits, or masses Cardiac: RRR; no murmurs, rubs, or gallops, no edema. Diminished pedal pulses bilaterally. Respiratory:  clear to auscultation bilaterally, normal work of breathing GI: soft, nontender, nondistended, + BS MS: no deformity or atrophy Skin: warm and dry, no rash Neuro:  Alert and Oriented x 3, Strength and sensation are intact, follows commands Psych: euthymic mood, full affect  Wt Readings from Last 3 Encounters:  12/18/17 194 lb 12.8 oz (88.4 kg)  06/11/17 199 lb (90.3 kg)  03/09/17 201 lb (91.2 kg)       Studies/Labs Reviewed:   EKG:  EKG was ordered today and personally reviewed by me and demonstrates sinus bradycardia 1st degree AV block, no acute St-T changes, QTc 349ms  Recent Labs: 06/11/2017: ALT 33; BUN 18; Creatinine, Ser 1.29; Potassium 4.7; Sodium 136   Lipid Panel    Component Value Date/Time   CHOL 142 06/11/2017 0923   TRIG 173 (H) 06/11/2017 0923   HDL 41 06/11/2017 0923   CHOLHDL 3.5 06/11/2017 0923   CHOLHDL 2.4 07/03/2016 0402   VLDL 17 07/03/2016 0402   LDLCALC 66 06/11/2017 0923    Additional studies/ records that were reviewed today include: Summarized above.   ASSESSMENT & PLAN:   1. NICM/chronic systolic CHF - he appears euvolemic. He's lost a few pounds by trying to eat smaller portions. Initial BP by assisting tech was 98/54. BP personally rechecked by me, 110/74. He is tolerating all meds without difficulty. I do not think we can further titrate at this time. Continue present regimen. Update CMET, digoxin level. Discussed importance of abstinence from smoking and alcohol. He indicates motivation to work on this. 2. Mild CAD - no recent anginal sx. Continue ASA, BB, statin. Check CMET, CBC, lipids. 3. PAD - no symptoms of claudication. Continue medical therapy with statin. Could consider statin titration if LDL is >70. Tobacco cessation advised. We discussed warning signs of ischemia. 4. History of prolonged QT - this has normalized on EKG. Continue to monitor.  Disposition: F/u with Dr. Meda Coffee in 6 months.  Medication Adjustments/Labs and Tests Ordered: Current medicines are reviewed at length with the patient today.  Concerns regarding medicines are outlined above. Medication changes, Labs and Tests ordered today are summarized above and listed in the Patient Instructions accessible in Encounters.   Signed, Charlie Pitter, PA-C  12/18/2017 9:37 AM    [place], [place], [place]  [address] Phone: [phone];  Fax: [phone]

## 2017-12-18 ENCOUNTER — Encounter: Payer: Self-pay | Admitting: Physician Assistant

## 2017-12-18 ENCOUNTER — Ambulatory Visit (INDEPENDENT_AMBULATORY_CARE_PROVIDER_SITE_OTHER): Payer: Medicaid Other | Admitting: Physician Assistant

## 2017-12-18 VITALS — BP 110/74 | HR 56 | Ht 69.0 in | Wt 194.8 lb

## 2017-12-18 DIAGNOSIS — I2510 Atherosclerotic heart disease of native coronary artery without angina pectoris: Secondary | ICD-10-CM

## 2017-12-18 DIAGNOSIS — Z87898 Personal history of other specified conditions: Secondary | ICD-10-CM

## 2017-12-18 DIAGNOSIS — I739 Peripheral vascular disease, unspecified: Secondary | ICD-10-CM

## 2017-12-18 DIAGNOSIS — I428 Other cardiomyopathies: Secondary | ICD-10-CM | POA: Diagnosis not present

## 2017-12-18 DIAGNOSIS — I5022 Chronic systolic (congestive) heart failure: Secondary | ICD-10-CM | POA: Diagnosis not present

## 2017-12-18 LAB — COMPREHENSIVE METABOLIC PANEL WITH GFR
ALT: 34 IU/L (ref 0–44)
AST: 29 IU/L (ref 0–40)
Albumin/Globulin Ratio: 1.3 (ref 1.2–2.2)
Albumin: 4.9 g/dL — ABNORMAL HIGH (ref 3.6–4.8)
Alkaline Phosphatase: 62 IU/L (ref 39–117)
BUN/Creatinine Ratio: 13 (ref 10–24)
BUN: 16 mg/dL (ref 8–27)
Bilirubin Total: 0.3 mg/dL (ref 0.0–1.2)
CO2: 23 mmol/L (ref 20–29)
Calcium: 9.8 mg/dL (ref 8.6–10.2)
Chloride: 98 mmol/L (ref 96–106)
Creatinine, Ser: 1.26 mg/dL (ref 0.76–1.27)
GFR calc Af Amer: 70 mL/min/1.73
GFR calc non Af Amer: 60 mL/min/1.73
Globulin, Total: 3.7 g/dL (ref 1.5–4.5)
Glucose: 81 mg/dL (ref 65–99)
Potassium: 5.4 mmol/L — ABNORMAL HIGH (ref 3.5–5.2)
Sodium: 136 mmol/L (ref 134–144)
Total Protein: 8.6 g/dL — ABNORMAL HIGH (ref 6.0–8.5)

## 2017-12-18 LAB — LIPID PANEL
Chol/HDL Ratio: 3 ratio (ref 0.0–5.0)
Cholesterol, Total: 136 mg/dL (ref 100–199)
HDL: 45 mg/dL
LDL Calculated: 68 mg/dL (ref 0–99)
Triglycerides: 113 mg/dL (ref 0–149)
VLDL Cholesterol Cal: 23 mg/dL (ref 5–40)

## 2017-12-18 LAB — CBC
Hematocrit: 42.5 % (ref 37.5–51.0)
Hemoglobin: 14.9 g/dL (ref 13.0–17.7)
MCH: 32 pg (ref 26.6–33.0)
MCHC: 35.1 g/dL (ref 31.5–35.7)
MCV: 91 fL (ref 79–97)
Platelets: 261 10*3/uL (ref 150–450)
RBC: 4.65 x10E6/uL (ref 4.14–5.80)
RDW: 13 % (ref 12.3–15.4)
WBC: 5.1 10*3/uL (ref 3.4–10.8)

## 2017-12-18 LAB — DIGOXIN LEVEL: Digoxin, Serum: 0.6 ng/mL (ref 0.5–0.9)

## 2017-12-18 MED ORDER — ATORVASTATIN CALCIUM 20 MG PO TABS: 20.0000 mg | ORAL_TABLET | Freq: Every day | ORAL | 3 refills | Status: DC

## 2017-12-18 MED ORDER — SACUBITRIL-VALSARTAN 24-26 MG PO TABS: 1.0000 | ORAL_TABLET | Freq: Two times a day (BID) | ORAL | 3 refills | Status: DC

## 2017-12-18 MED ORDER — PANTOPRAZOLE SODIUM 40 MG PO TBEC: 40.0000 mg | DELAYED_RELEASE_TABLET | Freq: Every day | ORAL | 3 refills | Status: DC

## 2017-12-18 NOTE — Patient Instructions (Addendum)
Medication Instructions:  Your physician recommends that you continue on your current medications as directed. Please refer to the Current Medication list given to you today.  If you need a refill on your cardiac medications before your next appointment, please call your pharmacy.   Lab work: TODAY:  CMET, CBC, LIPIDS, & DIGOXIN LEVEL  If you have labs (blood work) drawn today and your tests are completely normal, you will receive your results only by: Marland Kitchen MyChart Message (if you have MyChart) OR . A paper copy in the mail If you have any lab test that is abnormal or we need to change your treatment, we will call you to review the results.  Testing/Procedures: None ordered  Follow-Up: At [facility], you and your health needs are our priority.  As part of our continuing mission to provide you with exceptional heart care, we have created designated Provider Care Teams.  These Care Teams include your primary Cardiologist (physician) and Advanced Practice Providers (APPs -  Physician Assistants and Nurse Practitioners) who all work together to provide you with the care you need, when you need it. You will need a follow up appointment in 6 months.  Please call our office 2 months in advance to schedule this appointment.  You may see Ena Dawley, MD or one of the following Advanced Practice Providers on your designated Care Team:   [place], PA-C Melina Copa, PA-C . Ermalinda Barrios, PA-C  Any Other Special Instructions Will Be Listed Below (If Applicable).

## 2017-12-18 NOTE — Addendum Note (Signed)
Addended by: Gaetano Net on: 12/18/2017 12:11 PM   Modules accepted: Orders

## 2017-12-19 ENCOUNTER — Other Ambulatory Visit: Payer: Medicaid Other

## 2017-12-19 ENCOUNTER — Telehealth: Payer: Self-pay | Admitting: *Deleted

## 2017-12-19 DIAGNOSIS — Z79899 Other long term (current) drug therapy: Secondary | ICD-10-CM

## 2017-12-19 LAB — BASIC METABOLIC PANEL
BUN/Creatinine Ratio: 19 (ref 10–24)
BUN: 25 mg/dL (ref 8–27)
CO2: 28 mmol/L (ref 20–29)
CREATININE: 1.33 mg/dL — AB (ref 0.76–1.27)
Calcium: 9.7 mg/dL (ref 8.6–10.2)
Chloride: 103 mmol/L (ref 96–106)
GFR calc Af Amer: 65 mL/min/{1.73_m2} (ref >59)
GFR calc non Af Amer: 56 mL/min/{1.73_m2} — ABNORMAL LOW (ref >59)
Glucose: 95 mg/dL (ref 65–99)
Potassium: 5.3 mmol/L — ABNORMAL HIGH (ref 3.5–5.2)
Sodium: 139 mmol/L (ref 134–144)

## 2017-12-19 MED ORDER — SPIRONOLACTONE 25 MG PO TABS: 12.5000 mg | ORAL_TABLET | Freq: Every day | ORAL | 3 refills | Status: DC

## 2017-12-19 NOTE — Telephone Encounter (Signed)
-----   Message from Charlie Pitter, [place] sent at 12/18/2017  5:37 PM EST ----- Please let patient know hemoglobin has decreased from prior but this appeared falsely elevated. It is now normal. His labs show elevated potassium. This was previously normal in 06/2017 so I would recommend bringing him in for a recheck tomorrow to repeat to decide if meds need to be adjusted or if just a lab error. Dayna Dunn PA-C

## 2017-12-19 NOTE — Telephone Encounter (Signed)
-----   Message from Charlie Pitter, [place] sent at 12/19/2017 12:41 PM EST ----- Please let patient know potassium remains consistently elevated.   Please decrease spironolactone to 1/2 tablet daily with recheck BMET on Monday. Please DECREASE dietary intake of sources high in potassium including bananas, squash, yogurt, white beans, sweet potatoes, leafy greens, and avocados. Review all supplements and make sure not taking any extra potassium in either vitamin forms or salt substitutes. Dayna Dunn PA-C

## 2017-12-19 NOTE — Addendum Note (Signed)
Addended by: Gaetano Net on: 12/19/2017 08:26 AM   Modules accepted: Orders

## 2017-12-24 ENCOUNTER — Other Ambulatory Visit: Payer: Medicaid Other

## 2018-01-25 ENCOUNTER — Other Ambulatory Visit: Payer: Self-pay | Admitting: Cardiology

## 2018-01-25 NOTE — Telephone Encounter (Signed)
PA for Entresto 24-26 approved 10/11/2017 through 10/06/2018  Reference # [id number]

## 2018-02-28 ENCOUNTER — Telehealth: Payer: Self-pay | Admitting: *Deleted

## 2018-02-28 NOTE — Telephone Encounter (Signed)
Received fax from pharmacy requesting a PA for ENTRESTO, I called medicaid and confirmed, as per our notes, that medication is covered through 10/2018. I call patients pharmacy and informed them of this.

## 2018-03-04 ENCOUNTER — Other Ambulatory Visit: Payer: Self-pay

## 2018-03-04 ENCOUNTER — Other Ambulatory Visit: Payer: Self-pay | Admitting: Internal Medicine

## 2018-03-04 MED ORDER — DIGOXIN 125 MCG PO TABS: 0.1250 mg | ORAL_TABLET | Freq: Every day | ORAL | 2 refills | Status: DC

## 2018-04-02 ENCOUNTER — Telehealth: Payer: Self-pay

## 2018-04-02 NOTE — Telephone Encounter (Signed)
**Note De-Identified [employer] Obfuscation** We received another [place] PA from [place]. I called [facility] to advise them that we did an [place] PA on 01/25/2018 and that it was approved as follows:  January 25, 2018  Carylon Perches, CMA      4:37 PM  Note    PA for [facility] 24-26 approved 10/11/2017 through 10/06/2018  Reference # [id number]     I called [facility] pharmacy to advise but they insist that they need another [place] PA done so I did it and received the following immediate answer from covermymeds:  [name] (Key: [id number]) Rx #: [id number] Entresto 24-26MG  tablets   Form [employer] Electronic PA Form Created 1 day ago Sent to Plan 17 minutes ago Plan Response 16 minutes ago Submit Clinical Questions 1 minute ago Determination Favorable less than a minute ago  Message from Plan PA Case: [id number], Status: Approved, Coverage Starts on: 01/02/2018 12:00:00 AM, Coverage Ends on: 01/02/2019 12:00:00 AM.   I have notified [facility] of this approval.

## 2018-06-05 ENCOUNTER — Other Ambulatory Visit: Payer: Self-pay | Admitting: Nurse Practitioner

## 2018-06-05 ENCOUNTER — Other Ambulatory Visit: Payer: Self-pay | Admitting: Cardiology

## 2018-07-12 ENCOUNTER — Other Ambulatory Visit: Payer: Self-pay

## 2018-08-15 ENCOUNTER — Encounter (HOSPITAL_COMMUNITY): Payer: Medicaid Other | Admitting: Internal Medicine

## 2018-10-23 ENCOUNTER — Other Ambulatory Visit: Payer: Self-pay

## 2018-10-23 ENCOUNTER — Ambulatory Visit (HOSPITAL_COMMUNITY)
Admission: RE | Admit: 2018-10-23 | Discharge: 2018-10-23 | Disposition: A | Discharge status: Home / Self Care | Payer: Medicare HMO | Source: Ambulatory Visit | Attending: Internal Medicine | Admitting: Internal Medicine

## 2018-10-23 ENCOUNTER — Encounter (HOSPITAL_COMMUNITY): Payer: Self-pay | Admitting: Internal Medicine

## 2018-10-23 VITALS — BP 130/80 | HR 61 | Wt 197.0 lb

## 2018-10-23 DIAGNOSIS — I5022 Chronic systolic (congestive) heart failure: Secondary | ICD-10-CM | POA: Diagnosis not present

## 2018-10-23 DIAGNOSIS — I428 Other cardiomyopathies: Secondary | ICD-10-CM | POA: Diagnosis not present

## 2018-10-23 DIAGNOSIS — I130 Hypertensive heart and chronic kidney disease with heart failure and stage 1 through stage 4 chronic kidney disease, or unspecified chronic kidney disease: Secondary | ICD-10-CM | POA: Diagnosis not present

## 2018-10-23 DIAGNOSIS — I2510 Atherosclerotic heart disease of native coronary artery without angina pectoris: Secondary | ICD-10-CM | POA: Insufficient documentation

## 2018-10-23 DIAGNOSIS — F17210 Nicotine dependence, cigarettes, uncomplicated: Secondary | ICD-10-CM | POA: Diagnosis not present

## 2018-10-23 DIAGNOSIS — Z7982 Long term (current) use of aspirin: Secondary | ICD-10-CM | POA: Insufficient documentation

## 2018-10-23 DIAGNOSIS — F1010 Alcohol abuse, uncomplicated: Secondary | ICD-10-CM | POA: Insufficient documentation

## 2018-10-23 DIAGNOSIS — N183 Chronic kidney disease, stage 3 unspecified: Secondary | ICD-10-CM | POA: Insufficient documentation

## 2018-10-23 DIAGNOSIS — R001 Bradycardia, unspecified: Secondary | ICD-10-CM | POA: Insufficient documentation

## 2018-10-23 DIAGNOSIS — Z79899 Other long term (current) drug therapy: Secondary | ICD-10-CM | POA: Insufficient documentation

## 2018-10-23 DIAGNOSIS — K219 Gastro-esophageal reflux disease without esophagitis: Secondary | ICD-10-CM | POA: Diagnosis not present

## 2018-10-23 DIAGNOSIS — E785 Hyperlipidemia, unspecified: Secondary | ICD-10-CM | POA: Diagnosis not present

## 2018-10-23 DIAGNOSIS — I10 Essential (primary) hypertension: Secondary | ICD-10-CM | POA: Diagnosis not present

## 2018-10-23 DIAGNOSIS — I440 Atrioventricular block, first degree: Secondary | ICD-10-CM | POA: Diagnosis not present

## 2018-10-23 DIAGNOSIS — I739 Peripheral vascular disease, unspecified: Secondary | ICD-10-CM | POA: Insufficient documentation

## 2018-10-23 LAB — BASIC METABOLIC PANEL
Anion gap: 10 (ref 5–15)
BUN: 23 mg/dL (ref 8–23)
CO2: 22 mmol/L (ref 22–32)
Calcium: 9.2 mg/dL (ref 8.9–10.3)
Chloride: 104 mmol/L (ref 98–111)
Creatinine, Ser: 1.06 mg/dL (ref 0.61–1.24)
GFR calc Af Amer: >60 mL/min (ref >60)
GFR calc non Af Amer: >60 mL/min (ref >60)
Glucose, Bld: 107 mg/dL — ABNORMAL HIGH (ref 70–99)
Potassium: 4.4 mmol/L (ref 3.5–5.1)
Sodium: 136 mmol/L (ref 135–145)

## 2018-10-23 MED ORDER — ENTRESTO 49-51 MG PO TABS: 1.0000 | ORAL_TABLET | Freq: Two times a day (BID) | ORAL | 3 refills | Status: DC

## 2018-10-23 NOTE — Progress Notes (Signed)
Advanced Heart Failure Clinic Note   Referring Physician: PCP: Nuala Alpha, DO PCP-Cardiologist: Ena Dawley, MD  [facility]: Dr. Haroldine Laws   HPI: Andres Boyd a 63 y.o.African American malewith hx of non-obstructive CAD, HTN, ETOH, tobacco abuse, ETOH abuse and HLD and systolic heart failure.    Pt previously admitted 07/03/2016 wth sharp chest pain. At this time was drinking >8 ETOH beverages each night and attributed it to GERD. Trop peaked at 0.31. UDS + for marijuana and opioids. Felt to be related to ETOH intake, but Nuclear stress showed possible inferior ischemia. Cath showed moderated non-obstructive disease as below.   Pt presented to [facility] 09/05/16 with severe midsternal chest pain with radiation to right and left chest, right back, and abdomen. No SOB. Associated with diaphoresis and N/V. Given SL NTG x 3 with no relief. Troponin negative so sent home. He continued to worsen throughout the day, so returned to ED. Recheck Troponin 0.31. Cardiology consulted. Pertinent labs on admission include K 4.0, Creatinine 1.22, Troponin peaked at 3.42 on 9.5.18. D-dimer negative.   Repeat cath 09/06/16 as below with no change to CAD from 07/2016. Echo with significant drop in EF since July (50-55% -> 25-30%). cMRI with LGE uptake suggestive of myocarditis vs infiltrative CMP. PYP scan without evidence of TTR amyloid. He was diuresed 7 pounds and discharged on 09/10/16. Discharge weight was 171 pounds. Started on losartan, Coreg, Spiro and digoxin. Losartan eventually changed to [facility].   He has not been seen in [facility] since 2018. He has been followed regularly by general cardiology. Followed by Dr. Meda Coffee. He had a repeat echo 03/2017 that showed improved LVEF back to normal at 60-65% w/ G1 DD. Normal RV size and systolic function. No significant valvular abnormalities.   He reports that he has done fairly well since his last OV. He denies any exertional dyspnea. No orthopnea/PND, weight gain  or LEE. Denies CP. Reports full med compliance. No side effects. BP 130/80. EKG shows sinus bradycardia, 57 bpm, w/ 1st degree AV block. Asymptomatic. He continues to smoke 1/2 ppd. Drinks beers ~3-4 times a week.   2D echo 03/2017  Study Conclusions  - Left ventricle: The cavity size was normal. Wall thickness was   normal. Systolic function was normal. The estimated ejection   fraction was in the range of 60% to 65%. Wall motion was normal;   there were no regional wall motion abnormalities. Doppler   parameters are consistent with abnormal left ventricular   relaxation (grade 1 diastolic dysfunction). - Aortic valve: There was no stenosis. - Mitral valve: There was trivial regurgitation. - Right ventricle: The cavity size was normal. Systolic function   was normal. - Tricuspid valve: Peak RV-RA gradient (S): 21 mm Hg. - Pulmonary arteries: PA peak pressure: 24 mm Hg (S). - Inferior vena cava: The vessel was normal in size. The   respirophasic diameter changes were in the normal range (>= 50%),   consistent with normal central venous pressure.  Impressions:  - Normal LV size with EF 60-65%. Normal wall motion. Normal RV size   and systolic function. No significant valvular abnormalities.  [facility]HC 09/06/2016 Coronary Findings  Diagnostic Dominance: Right Left Main  The vessel exhibits minimal luminal irregularities. The left main is calcified with no obstructive disease  Left Anterior Descending  The vessel exhibits minimal luminal irregularities.  First Diagonal Branch  The vessel exhibits minimal luminal irregularities.  Second Diagonal [employer] 2nd Diag to 2nd Diag lesion 50% stenosed  Ost 2nd Diag to 2nd Diag lesion.  Ramus Intermedius  There is mild the vessel.  Left Circumflex  The vessel exhibits minimal luminal irregularities.  Right Coronary Artery  The vessel exhibits minimal luminal irregularities. Patent vessel with no obstructive disease  Prox RCA lesion 30%  stenosed  Prox RCA lesion.  Intervention- non medical therapy    Review of Systems: [y] = yes, [ ]  = no   General: Weight gain [ ] ; Weight loss [ ] ; Anorexia [ ] ; Fatigue [ ] ; Fever [ ] ; Chills [ ] ; Weakness [ ]   Cardiac: Chest pain/pressure [ ] ; Resting SOB [ ] ; Exertional SOB [ ] ; Orthopnea [ ] ; Pedal Edema [ ] ; Palpitations [ ] ; Syncope [ ] ; Presyncope [ ] ; Paroxysmal nocturnal dyspnea[ ]   Pulmonary: Cough [ ] ; Wheezing[ ] ; Hemoptysis[ ] ; Sputum [ ] ; Snoring [ ]   GI: Vomiting[ ] ; Dysphagia[ ] ; Melena[ ] ; Hematochezia [ ] ; Heartburn[ ] ; Abdominal pain [ ] ; Constipation [ ] ; Diarrhea [ ] ; BRBPR [ ]   GU: Hematuria[ ] ; Dysuria [ ] ; Nocturia[ ]   Vascular: Pain in legs with walking [ ] ; Pain in feet with lying flat [ ] ; Non-healing sores [ ] ; Stroke [ ] ; TIA [ ] ; Slurred speech [ ] ;  Neuro: Headaches[ ] ; Vertigo[ ] ; Seizures[ ] ; Paresthesias[ ] ;Blurred vision [ ] ; Diplopia [ ] ; Vision changes [ ]   Ortho/Skin: Arthritis [ ] ; Joint pain [ ] ; Muscle pain [ ] ; Joint swelling [ ] ; Back Pain [ ] ; Rash [ ]   Psych: Depression[ ] ; Anxiety[ ]   Heme: Bleeding problems [ ] ; Clotting disorders [ ] ; Anemia [ ]   Endocrine: Diabetes [ ] ; Thyroid dysfunction[ ]    Past Medical History:  Diagnosis Date  . 1st degree AV block   . CKD (chronic kidney disease), stage III   . ETOH abuse   . Hyperlipidemia   . Hypertension   . Hyponatremia   . Mild CAD   . NICM (nonischemic cardiomyopathy) ([place])   . PAD (peripheral artery disease) ([place])    a. LE vasc testing 03/2017 - LE duplex for claudication 03/2017 showing 30-49% stenosis in R popliteal and total occlusion in posterior tibial. Managed medically.  . Prolonged QT interval   . Sinus bradycardia   . Tobacco abuse     Current Outpatient Medications  Medication Sig Dispense Refill  . aspirin EC 81 MG EC tablet Take 1 tablet (81 mg total) by mouth daily. 30 tablet 1  . atorvastatin (LIPITOR) 20 MG tablet Take 1 tablet (20 mg total) by mouth daily at 6 PM. 90  tablet 3  . carvedilol (COREG) 3.125 MG tablet TAKE 1 TABLET(3.125 MG) BY MOUTH TWICE DAILY 180 tablet 3  . digoxin (LANOXIN) 0.125 MG tablet Take 1 tablet (0.125 mg total) by mouth daily. 90 tablet 2  . Multiple Vitamin (MULTIVITAMIN WITH MINERALS) TABS tablet Take 1 tablet by mouth daily.    . nitroGLYCERIN (NITROSTAT) 0.4 MG SL tablet Place 1 tablet (0.4 mg total) under the tongue every 5 (five) minutes as needed for chest pain. 90 tablet 3  . pantoprazole (PROTONIX) 40 MG tablet Take 1 tablet (40 mg total) by mouth daily. 90 tablet 3  . sacubitril-valsartan (ENTRESTO) 24-26 MG Take 1 tablet by mouth 2 (two) times daily. 180 tablet 3  . spironolactone (ALDACTONE) 25 MG tablet TAKE 1 TABLET BY MOUTH ONCE DAILY 90 tablet 3   No current facility-administered medications for this encounter.     No Known Allergies    Social History  Socioeconomic History  . Marital status: Single    Spouse name: Not on file  . Number of children: Not on file  . Years of education: Not on file  . Highest education level: Not on file  Occupational History  . Not on file  Social Needs  . Financial resource strain: Not on file  . Food insecurity    Worry: Not on file    Inability: Not on file  . Transportation needs    Medical: Not on file    Non-medical: Not on file  Tobacco Use  . Smoking status: Current Every Day Smoker    Packs/day: 1.00    Years: 36.00    Pack years: 36.00    Types: Cigarettes  . Smokeless tobacco: Never Used  Substance and Sexual Activity  . Alcohol use: Yes    Alcohol/week: 2.0 - 3.0 standard drinks    Types: 2 - 3 Cans of beer per week    Comment: 2-3 32oz cans daily  . Drug use: Yes    Types: Marijuana  . Sexual activity: Not on file  Lifestyle  . Physical activity    Days per week: Not on file    Minutes per session: Not on file  . Stress: Not on file  Relationships  . Social Herbalist on phone: Not on file    Gets together: Not on file     Attends religious service: Not on file    Active member of club or organization: Not on file    Attends meetings of clubs or organizations: Not on file    Relationship status: Not on file  . Intimate partner violence    Fear of current or ex partner: Not on file    Emotionally abused: Not on file    Physically abused: Not on file    Forced sexual activity: Not on file  Other Topics Concern  . Not on file  Social History Narrative   Patient unemployed. Lives in [place] with son.       Family History  Problem Relation Age of Onset  . Diabetes Mother   . Hypertension Mother   . Diabetes Father   . Diabetes Maternal Grandmother   . Hypertension Maternal Grandmother   . Diabetes Maternal Grandfather   . Hypertension Maternal Grandfather     Vitals:   10/23/18 0920  BP: 130/80  Pulse: 61  SpO2: 98%  Weight: 89.4 kg (197 lb)     PHYSICAL EXAM: General:  Well appearing. No respiratory difficulty HEENT: normal Neck: supple. no JVD. Carotids 2+ bilat; no bruits. No lymphadenopathy or thyromegaly appreciated. Cor: PMI nondisplaced. Regular rate & rhythm. No rubs, gallops or murmurs. Lungs: clear Abdomen: soft, nontender, nondistended. No hepatosplenomegaly. No bruits or masses. Good bowel sounds. Extremities: no cyanosis, clubbing, rash, edema Neuro: alert & oriented x 3, cranial nerves grossly intact. moves all 4 extremities w/o difficulty. Affect pleasant.  ECG: sinus bradycardia 57 bpm. 1st degree AV block    ASSESSMENT & PLAN:  1. H/osystolic CHF (previous EF 25-30%), due to NICM (likely viral): EF normalized to 60-65% on repeat echo 03/2017 after treatment w/ guidelines directed medical therapy.  No limitations w/ exercise/ ADLs>>>NYHA Functional Class I. Euvolemic on exam. - Will discontinue digoxin. - BP 130/80. Check BMP today. If stable, plan to increase Entresto to 49-51  - Continue carvedilol 3.125 mg BID. Unable to titrate due to baseline bradycardia (HR  upper 50s and asymptomatic) - Continue spironolactone 25  mg daily  -Will repeat 2D echo to reassess LVF to ensure no interval change.   2. Non obstructive CAD - Stable w/o CP - Continue ASA and atorvastatin (lipid management per general cardiology)  3. HTN -BP mildly elevated at 130/80.  -Plan to increase Entresto to 49-51.  -Check BMP today -Continue Coreg 3.125 mg bid  -Continue Spiro 25 mg daily  4. ETOH/tobacco abuse  - abstinence encouraged   F/u: will arrange f/u w/ APP in 3 weeks to reassess response to Entresto dose titration and to review echo results. If EF remains normal, will plan routine f/u in general cardiology clinic going forward. General cardiology can refer back to [facility] if needed.   Lyda Jester, PA-C 10/23/18   Patient seen and examined with the above-signed Advanced Practice Provider and/or Housestaff. I personally reviewed laboratory data, imaging studies and relevant notes. I independently examined the patient and formulated the important aspects of the plan. I have edited the note to reflect any of my changes or salient points. I have personally discussed the plan with the patient and/or family.  He is much improved. Repeat echo from last year shows normalized EF. (Personally reviewed). Now NYHA I. Volume looks good. BP borderline high. Will increase Entresto to 49/51. Stop digoxin. Repeat echo. If EF preserved can graduate HF program and f/u with [facility]> Check labs today.  Glori Bickers, MD  9:25 PM

## 2018-10-23 NOTE — Patient Instructions (Signed)
Stop Digoxin  Increase Entresto to 49/51 mg Twice daily   Labs done today, we will contact you for abnormal readings  Your physician has requested that you have an echocardiogram. Echocardiography is a painless test that uses sound waves to create images of your heart. It provides your doctor with information about the size and shape of your heart and how well your heart's chambers and valves are working. This procedure takes approximately one hour. There are no restrictions for this procedure.  Your physician recommends that you schedule a follow-up appointment in: 3 weeks  If you have any questions or concerns before your next appointment please send [place] a message through [place] or call our office at [phone].  At the [facility], you and your health needs are our priority. As part of our continuing mission to provide you with exceptional heart care, we have created designated Provider Care Teams. These Care Teams include your primary Cardiologist (physician) and Advanced Practice Providers (APPs- Physician Assistants and Nurse Practitioners) who all work together to provide you with the care you need, when you need it.   You may see any of the following providers on your designated Care Team at your next follow up: Marland Kitchen Dr Glori Bickers . Dr Loralie Champagne . Darrick Grinder, NP . Lyda Jester, PA   Please be sure to bring in all your medications bottles to every appointment.

## 2018-10-28 ENCOUNTER — Other Ambulatory Visit: Payer: Self-pay

## 2018-10-28 ENCOUNTER — Ambulatory Visit (HOSPITAL_COMMUNITY)
Admission: RE | Admit: 2018-10-28 | Discharge: 2018-10-28 | Disposition: A | Discharge status: Home / Self Care | Payer: Medicare HMO | Source: Ambulatory Visit | Attending: Family Medicine | Admitting: Family Medicine

## 2018-10-28 DIAGNOSIS — E785 Hyperlipidemia, unspecified: Secondary | ICD-10-CM | POA: Insufficient documentation

## 2018-10-28 DIAGNOSIS — I5022 Chronic systolic (congestive) heart failure: Secondary | ICD-10-CM | POA: Insufficient documentation

## 2018-10-28 DIAGNOSIS — I083 Combined rheumatic disorders of mitral, aortic and tricuspid valves: Secondary | ICD-10-CM | POA: Diagnosis not present

## 2018-10-28 DIAGNOSIS — I110 Hypertensive heart disease with heart failure: Secondary | ICD-10-CM | POA: Insufficient documentation

## 2018-10-28 NOTE — Progress Notes (Signed)
Echocardiogram 2D Echocardiogram has been performed.  Oneal Deputy Felicity Penix 10/28/2018, 10:55 AM

## 2018-11-10 ENCOUNTER — Other Ambulatory Visit: Payer: Self-pay | Admitting: Physician Assistant

## 2018-11-12 ENCOUNTER — Encounter (HOSPITAL_COMMUNITY): Payer: Self-pay

## 2018-11-12 ENCOUNTER — Ambulatory Visit (HOSPITAL_COMMUNITY)
Admission: RE | Admit: 2018-11-12 | Discharge: 2018-11-12 | Disposition: A | Discharge status: Home / Self Care | Payer: Medicare HMO | Source: Ambulatory Visit | Attending: Cardiology | Admitting: Cardiology

## 2018-11-12 ENCOUNTER — Other Ambulatory Visit: Payer: Self-pay

## 2018-11-12 VITALS — BP 101/80 | HR 62 | Wt 196.2 lb

## 2018-11-12 DIAGNOSIS — I5022 Chronic systolic (congestive) heart failure: Secondary | ICD-10-CM | POA: Insufficient documentation

## 2018-11-12 DIAGNOSIS — I5042 Chronic combined systolic (congestive) and diastolic (congestive) heart failure: Secondary | ICD-10-CM | POA: Diagnosis not present

## 2018-11-12 DIAGNOSIS — I2510 Atherosclerotic heart disease of native coronary artery without angina pectoris: Secondary | ICD-10-CM | POA: Insufficient documentation

## 2018-11-12 DIAGNOSIS — I428 Other cardiomyopathies: Secondary | ICD-10-CM | POA: Insufficient documentation

## 2018-11-12 DIAGNOSIS — Z8249 Family history of ischemic heart disease and other diseases of the circulatory system: Secondary | ICD-10-CM | POA: Insufficient documentation

## 2018-11-12 DIAGNOSIS — K219 Gastro-esophageal reflux disease without esophagitis: Secondary | ICD-10-CM | POA: Insufficient documentation

## 2018-11-12 DIAGNOSIS — F17210 Nicotine dependence, cigarettes, uncomplicated: Secondary | ICD-10-CM | POA: Insufficient documentation

## 2018-11-12 DIAGNOSIS — E785 Hyperlipidemia, unspecified: Secondary | ICD-10-CM | POA: Diagnosis not present

## 2018-11-12 DIAGNOSIS — Z7982 Long term (current) use of aspirin: Secondary | ICD-10-CM | POA: Diagnosis not present

## 2018-11-12 DIAGNOSIS — N189 Chronic kidney disease, unspecified: Secondary | ICD-10-CM | POA: Insufficient documentation

## 2018-11-12 DIAGNOSIS — Z79899 Other long term (current) drug therapy: Secondary | ICD-10-CM | POA: Diagnosis not present

## 2018-11-12 DIAGNOSIS — I130 Hypertensive heart and chronic kidney disease with heart failure and stage 1 through stage 4 chronic kidney disease, or unspecified chronic kidney disease: Secondary | ICD-10-CM | POA: Insufficient documentation

## 2018-11-12 LAB — BASIC METABOLIC PANEL
Anion gap: 8 (ref 5–15)
BUN: 13 mg/dL (ref 8–23)
CO2: 26 mmol/L (ref 22–32)
Calcium: 9.3 mg/dL (ref 8.9–10.3)
Chloride: 106 mmol/L (ref 98–111)
Creatinine, Ser: 1.19 mg/dL (ref 0.61–1.24)
GFR calc Af Amer: >60 mL/min (ref >60)
GFR calc non Af Amer: >60 mL/min (ref >60)
Glucose, Bld: 114 mg/dL — ABNORMAL HIGH (ref 70–99)
Potassium: 4.6 mmol/L (ref 3.5–5.1)
Sodium: 140 mmol/L (ref 135–145)

## 2018-11-12 NOTE — Progress Notes (Signed)
Advanced Heart Failure Clinic Note   Referring Physician: PCP: Nuala Alpha, DO PCP-Cardiologist: Ena Dawley, MD  [facility]: Dr. Haroldine Laws   HPI: Andres Boyd a 63 y.o.African American malewith hx of non-obstructive CAD, HTN, ETOH, tobacco abuse, ETOH abuse and HLD and systolic heart failure.    Pt previously admitted 07/03/2016 wth sharp chest pain. At this time was drinking >8 ETOH beverages each night and attributed it to GERD. Trop peaked at 0.31. UDS + for marijuana and opioids. Felt to be related to ETOH intake, but Nuclear stress showed possible inferior ischemia. Cath showed moderated non-obstructive disease as below.   Pt presented to [facility] 09/05/16 with severe midsternal chest pain with radiation to right and left chest, right back, and abdomen. No SOB. Associated with diaphoresis and N/V. Given SL NTG x 3 with no relief. Troponin negative so sent home. He continued to worsen throughout the day, so returned to ED. Recheck Troponin 0.31. Cardiology consulted. Pertinent labs on admission include K 4.0, Creatinine 1.22, Troponin peaked at 3.42 on 9.5.18. D-dimer negative.   Repeat cath 09/06/16 as below with no change to CAD from 07/2016. Echo with significant drop in EF since July (50-55% -> 25-30%). cMRI with LGE uptake suggestive of myocarditis vs infiltrative CMP. PYP scan without evidence of TTR amyloid. He was diuresed 7 pounds and discharged on 09/10/16. Discharge weight was 171 pounds. Started on losartan, Coreg, Spiro and digoxin. Losartan eventually changed to [facility].   He was lost to f/u in the [facility] for a period of time but had been followed regularly by general cardiology. Followed by Dr. Meda Coffee. He had a repeat echo 03/2017 that showed improved LVEF back to normal at 60-65% w/ G1 DD. Normal RV size and systolic function. No significant valvular abnormalities.   He was seen back in [facility] on 10/21 and was doing well w/ NYHA class I symptoms. He was euvolemic on exam. Still  smoking 1/2 ppd and drinking beers ~3-4 times a week. His digoxin was discontinued and Entresto increased to 49/51 bid. He was also set up for repeat echo, which showed mildly decreased LVEF at 45-50%. RV normal. PA pressure normal.   Today in clinic, he is doing well w/o symptoms. NYHA I. Tolerating higher dose of Entresto ok. BP 101/80. Wt stable at 196 lb.    2D echo 10/2018  1. Left ventricular ejection fraction, by visual estimation, is 45 to 50%. The left ventricle has mildly decreased function. There is no left ventricular hypertrophy.  2. Global right ventricle has normal systolic function.The right ventricular size is normal. No increase in right ventricular wall thickness.  3. Left atrial size was normal.  4. Right atrial size was normal.  5. The mitral valve is normal in structure. Trace mitral valve regurgitation.  6. The tricuspid valve is grossly normal. Tricuspid valve regurgitation was not visualized by color flow Doppler.  7. The aortic valve is tricuspid Aortic valve regurgitation was not visualized by color flow Doppler. Mild to moderate aortic valve sclerosis/calcification without any evidence of aortic stenosis.  8. The pulmonic valve was grossly normal. Pulmonic valve regurgitation is not visualized by color flow Doppler.  9. Normal pulmonary artery systolic pressure. 10. The atrial septum is grossly normal.  LHC 09/06/2016 Coronary Findings  Diagnostic Dominance: Right Left Main  The vessel exhibits minimal luminal irregularities. The left main is calcified with no obstructive disease  Left Anterior Descending  The vessel exhibits minimal luminal irregularities.  First Diagonal Branch  The  vessel exhibits minimal luminal irregularities.  Second Diagonal [employer] 2nd Diag to 2nd Diag lesion 50% stenosed  Ost 2nd Diag to 2nd Diag lesion.  Ramus Intermedius  There is mild the vessel.  Left Circumflex  The vessel exhibits minimal luminal irregularities.  Right  Coronary Artery  The vessel exhibits minimal luminal irregularities. Patent vessel with no obstructive disease  Prox RCA lesion 30% stenosed  Prox RCA lesion.  Intervention- non medical therapy    Review of Systems: [y] = yes, [ ]  = no   General: Weight gain [ ] ; Weight loss [ ] ; Anorexia [ ] ; Fatigue [ ] ; Fever [ ] ; Chills [ ] ; Weakness [ ]   Cardiac: Chest pain/pressure [ ] ; Resting SOB [ ] ; Exertional SOB [ ] ; Orthopnea [ ] ; Pedal Edema [ ] ; Palpitations [ ] ; Syncope [ ] ; Presyncope [ ] ; Paroxysmal nocturnal dyspnea[ ]   Pulmonary: Cough [ ] ; Wheezing[ ] ; Hemoptysis[ ] ; Sputum [ ] ; Snoring [ ]   GI: Vomiting[ ] ; Dysphagia[ ] ; Melena[ ] ; Hematochezia [ ] ; Heartburn[ ] ; Abdominal pain [ ] ; Constipation [ ] ; Diarrhea [ ] ; BRBPR [ ]   GU: Hematuria[ ] ; Dysuria [ ] ; Nocturia[ ]   Vascular: Pain in legs with walking [ ] ; Pain in feet with lying flat [ ] ; Non-healing sores [ ] ; Stroke [ ] ; TIA [ ] ; Slurred speech [ ] ;  Neuro: Headaches[ ] ; Vertigo[ ] ; Seizures[ ] ; Paresthesias[ ] ;Blurred vision [ ] ; Diplopia [ ] ; Vision changes [ ]   Ortho/Skin: Arthritis [ ] ; Joint pain [ ] ; Muscle pain [ ] ; Joint swelling [ ] ; Back Pain [ ] ; Rash [ ]   Psych: Depression[ ] ; Anxiety[ ]   Heme: Bleeding problems [ ] ; Clotting disorders [ ] ; Anemia [ ]   Endocrine: Diabetes [ ] ; Thyroid dysfunction[ ]    Past Medical History:  Diagnosis Date  . 1st degree AV block   . CKD (chronic kidney disease), stage III   . ETOH abuse   . Hyperlipidemia   . Hypertension   . Hyponatremia   . Mild CAD   . NICM (nonischemic cardiomyopathy) ([place])   . PAD (peripheral artery disease) ([place])    a. LE vasc testing 03/2017 - LE duplex for claudication 03/2017 showing 30-49% stenosis in R popliteal and total occlusion in posterior tibial. Managed medically.  . Prolonged QT interval   . Sinus bradycardia   . Tobacco abuse     Current Outpatient Medications  Medication Sig Dispense Refill  . aspirin EC 81 MG EC tablet Take 1 tablet  (81 mg total) by mouth daily. 30 tablet 1  . atorvastatin (LIPITOR) 20 MG tablet Take 1 tablet (20 mg total) by mouth daily at 6 PM. 90 tablet 3  . carvedilol (COREG) 3.125 MG tablet TAKE 1 TABLET(3.125 MG) BY MOUTH TWICE DAILY 180 tablet 3  . Multiple Vitamin (MULTIVITAMIN WITH MINERALS) TABS tablet Take 1 tablet by mouth daily.    . nitroGLYCERIN (NITROSTAT) 0.4 MG SL tablet Place 1 tablet (0.4 mg total) under the tongue every 5 (five) minutes as needed for chest pain. 90 tablet 3  . pantoprazole (PROTONIX) 40 MG tablet Take 1 tablet (40 mg total) by mouth daily. 90 tablet 3  . sacubitril-valsartan (ENTRESTO) 49-51 MG Take 1 tablet by mouth 2 (two) times daily. 60 tablet 3  . spironolactone (ALDACTONE) 25 MG tablet TAKE 1 TABLET BY MOUTH ONCE DAILY 90 tablet 3   No current facility-administered medications for this encounter.     No Known Allergies    Social  History   Socioeconomic History  . Marital status: Single    Spouse name: Not on file  . Number of children: Not on file  . Years of education: Not on file  . Highest education level: Not on file  Occupational History  . Not on file  Social Needs  . Financial resource strain: Not on file  . Food insecurity    Worry: Not on file    Inability: Not on file  . Transportation needs    Medical: Not on file    Non-medical: Not on file  Tobacco Use  . Smoking status: Current Every Day Smoker    Packs/day: 1.00    Years: 36.00    Pack years: 36.00    Types: Cigarettes  . Smokeless tobacco: Never Used  Substance and Sexual Activity  . Alcohol use: Yes    Alcohol/week: 2.0 - 3.0 standard drinks    Types: 2 - 3 Cans of beer per week    Comment: 2-3 32oz cans daily  . Drug use: Yes    Types: Marijuana  . Sexual activity: Not on file  Lifestyle  . Physical activity    Days per week: Not on file    Minutes per session: Not on file  . Stress: Not on file  Relationships  . Social Herbalist on phone: Not on file     Gets together: Not on file    Attends religious service: Not on file    Active member of club or organization: Not on file    Attends meetings of clubs or organizations: Not on file    Relationship status: Not on file  . Intimate partner violence    Fear of current or ex partner: Not on file    Emotionally abused: Not on file    Physically abused: Not on file    Forced sexual activity: Not on file  Other Topics Concern  . Not on file  Social History Narrative   Patient unemployed. Lives in [place] with son.       Family History  Problem Relation Age of Onset  . Diabetes Mother   . Hypertension Mother   . Diabetes Father   . Diabetes Maternal Grandmother   . Hypertension Maternal Grandmother   . Diabetes Maternal Grandfather   . Hypertension Maternal Grandfather     Vitals:   11/12/18 0902  BP: 101/80  Pulse: 62  SpO2: 98%  Weight: 89 kg (196 lb 3.2 oz)     PHYSICAL EXAM: PHYSICAL EXAM: General:  Well appearing. No respiratory difficulty HEENT: normal Neck: supple. no JVD. Carotids 2+ bilat; no bruits. No lymphadenopathy or thyromegaly appreciated. Cor: PMI nondisplaced. Regular rate & rhythm. No rubs, gallops or murmurs. Lungs: clear Abdomen: soft, nontender, nondistended. No hepatosplenomegaly. No bruits or masses. Good bowel sounds. Extremities: no cyanosis, clubbing, rash, edema Neuro: alert & oriented x 3, cranial nerves grossly intact. moves all 4 extremities w/o difficulty. Affect pleasant.   ECG: not performed    ASSESSMENT & PLAN:  1. H/osystolic CHF (previous EF 25-30%), due to NICM (likely viral): EF normalized to 60-65% on repeat echo 03/2017 after treatment w/ guidelines directed medical therapy. Echo repeated 10/2018, LVEF mildly reduced 45-50%. RV normal. PAP normal.  No limitations w/ exercise/ ADLs>>>NYHA Functional Class I. Euvolemic on exam. - Continue Entresto 49-51 bid. BP too soft for further titration today. Check BMP.  - Continue  carvedilol 3.125 mg BID. Unable to titrate due to baseline bradycardia (  HR upper 50s/low 60s and asymptomatic) -Continue spironolactone 25 mg daily  -Discussed importance of strict med compliance, low sodium diet and daily weights. -Will release from [facility] at this time w/ plans to continue regular f/u by general cardiology. We will be happy to see him again in the future if indicated.  -f/u w/ Dr. Meda Coffee in 6 months.   2. Non obstructive CAD - Stable w/o CP - Continue ASA and atorvastatin (lipid management per general cardiology)  3. HTN -Controlled on current regimen -Continue Entresto to 49-51 bid -Continue Coreg 3.125 mg bid  -Continue Spiro 25 mg daily -Check BMP today   4. ETOH/tobacco abuse  - abstinence encouraged    Lyda Jester, PA-C 11/12/18

## 2018-11-12 NOTE — Patient Instructions (Addendum)
YOU GRADUATED!!!! YEAAAAAA!!!!   Labs were ordered today for you today. We will only call you if something is Abnormal.. NO CALL IS A GOOD CALL!!!   Since you Graduated from the Heart Failure Clinic, you were referred to Dr. Meda Coffee @ [address]. The scheduler from that office will contact you in 6 months for that follow up.   At the [facility], you and your health needs are our priority. As part of our continuing mission to provide you with exceptional heart care, we have created designated Provider Care Teams. These Care Teams include your primary Cardiologist (physician) and Advanced Practice Providers (APPs- Physician Assistants and Nurse Practitioners) who all work together to provide you with the care you need, when you need it.   You may see any of the following providers on your designated Care Team at your next follow up: Marland Kitchen Dr Glori Bickers . Dr Loralie Champagne . Darrick Grinder, NP . Lyda Jester, PA   Please be sure to bring in all your medications bottles to every appointment.

## 2018-11-13 ENCOUNTER — Encounter (HOSPITAL_COMMUNITY): Payer: Medicare HMO

## 2018-12-04 ENCOUNTER — Other Ambulatory Visit: Payer: Self-pay | Admitting: Cardiology

## 2018-12-29 ENCOUNTER — Emergency Department (HOSPITAL_COMMUNITY): Payer: Medicare HMO

## 2018-12-29 ENCOUNTER — Observation Stay (HOSPITAL_COMMUNITY)
Admission: EM | Admit: 2018-12-29 | Discharge: 2018-12-31 | Disposition: A | Discharge status: Home / Self Care | Payer: Medicare HMO | Attending: General Surgery | Admitting: General Surgery

## 2018-12-29 ENCOUNTER — Encounter (HOSPITAL_COMMUNITY): Payer: Self-pay | Admitting: Emergency Medicine

## 2018-12-29 ENCOUNTER — Other Ambulatory Visit: Payer: Self-pay

## 2018-12-29 DIAGNOSIS — I130 Hypertensive heart and chronic kidney disease with heart failure and stage 1 through stage 4 chronic kidney disease, or unspecified chronic kidney disease: Secondary | ICD-10-CM | POA: Diagnosis not present

## 2018-12-29 DIAGNOSIS — E785 Hyperlipidemia, unspecified: Secondary | ICD-10-CM | POA: Diagnosis not present

## 2018-12-29 DIAGNOSIS — I739 Peripheral vascular disease, unspecified: Secondary | ICD-10-CM | POA: Diagnosis not present

## 2018-12-29 DIAGNOSIS — I2510 Atherosclerotic heart disease of native coronary artery without angina pectoris: Secondary | ICD-10-CM | POA: Insufficient documentation

## 2018-12-29 DIAGNOSIS — K3580 Unspecified acute appendicitis: Secondary | ICD-10-CM | POA: Diagnosis not present

## 2018-12-29 DIAGNOSIS — R0902 Hypoxemia: Secondary | ICD-10-CM | POA: Diagnosis not present

## 2018-12-29 DIAGNOSIS — I10 Essential (primary) hypertension: Secondary | ICD-10-CM | POA: Diagnosis not present

## 2018-12-29 DIAGNOSIS — N183 Chronic kidney disease, stage 3 unspecified: Secondary | ICD-10-CM | POA: Insufficient documentation

## 2018-12-29 DIAGNOSIS — I5022 Chronic systolic (congestive) heart failure: Secondary | ICD-10-CM | POA: Insufficient documentation

## 2018-12-29 DIAGNOSIS — Z7982 Long term (current) use of aspirin: Secondary | ICD-10-CM | POA: Diagnosis not present

## 2018-12-29 DIAGNOSIS — R109 Unspecified abdominal pain: Secondary | ICD-10-CM | POA: Diagnosis not present

## 2018-12-29 DIAGNOSIS — I428 Other cardiomyopathies: Secondary | ICD-10-CM | POA: Insufficient documentation

## 2018-12-29 DIAGNOSIS — I252 Old myocardial infarction: Secondary | ICD-10-CM | POA: Insufficient documentation

## 2018-12-29 DIAGNOSIS — F17210 Nicotine dependence, cigarettes, uncomplicated: Secondary | ICD-10-CM | POA: Insufficient documentation

## 2018-12-29 DIAGNOSIS — Z79899 Other long term (current) drug therapy: Secondary | ICD-10-CM | POA: Insufficient documentation

## 2018-12-29 DIAGNOSIS — R079 Chest pain, unspecified: Secondary | ICD-10-CM | POA: Diagnosis not present

## 2018-12-29 DIAGNOSIS — K219 Gastro-esophageal reflux disease without esophagitis: Secondary | ICD-10-CM | POA: Diagnosis not present

## 2018-12-29 DIAGNOSIS — Z20828 Contact with and (suspected) exposure to other viral communicable diseases: Secondary | ICD-10-CM | POA: Insufficient documentation

## 2018-12-29 DIAGNOSIS — R1084 Generalized abdominal pain: Secondary | ICD-10-CM | POA: Diagnosis not present

## 2018-12-29 DIAGNOSIS — R0789 Other chest pain: Secondary | ICD-10-CM | POA: Diagnosis not present

## 2018-12-29 LAB — BASIC METABOLIC PANEL
Anion gap: 8 (ref 5–15)
BUN: 14 mg/dL (ref 8–23)
CO2: 22 mmol/L (ref 22–32)
Calcium: 9.3 mg/dL (ref 8.9–10.3)
Chloride: 106 mmol/L (ref 98–111)
Creatinine, Ser: 1.24 mg/dL (ref 0.61–1.24)
GFR calc Af Amer: >60 mL/min (ref >60)
GFR calc non Af Amer: >60 mL/min (ref >60)
Glucose, Bld: 142 mg/dL — ABNORMAL HIGH (ref 70–99)
Potassium: 4 mmol/L (ref 3.5–5.1)
Sodium: 136 mmol/L (ref 135–145)

## 2018-12-29 LAB — CBC
HCT: 43.8 % (ref 39.0–52.0)
Hemoglobin: 14.4 g/dL (ref 13.0–17.0)
MCH: 32 pg (ref 26.0–34.0)
MCHC: 32.9 g/dL (ref 30.0–36.0)
MCV: 97.3 fL (ref 80.0–100.0)
Platelets: 253 10*3/uL (ref 150–400)
RBC: 4.5 MIL/uL (ref 4.22–5.81)
RDW: 12.7 % (ref 11.5–15.5)
WBC: 8 10*3/uL (ref 4.0–10.5)
nRBC: 0 % (ref 0.0–0.2)

## 2018-12-29 LAB — TROPONIN I (HIGH SENSITIVITY): Troponin I (High Sensitivity): 3 ng/L (ref <18)

## 2018-12-29 NOTE — ED Triage Notes (Signed)
Pt reports intermittent right sided CP since 1700, 1 episode of emesis that he states made it better. States he drank too much last night and thinks it is the cause. A&O x4.

## 2018-12-30 ENCOUNTER — Encounter (HOSPITAL_COMMUNITY): Payer: Self-pay | Admitting: Surgery

## 2018-12-30 ENCOUNTER — Emergency Department (HOSPITAL_COMMUNITY): Payer: Medicare HMO | Admitting: Anesthesiology

## 2018-12-30 ENCOUNTER — Encounter (HOSPITAL_COMMUNITY)
Admission: EM | Disposition: A | Discharge status: Home / Self Care | Payer: Self-pay | Source: Home / Self Care | Attending: Emergency Medicine

## 2018-12-30 ENCOUNTER — Emergency Department (HOSPITAL_COMMUNITY): Payer: Medicare HMO

## 2018-12-30 DIAGNOSIS — K3580 Unspecified acute appendicitis: Secondary | ICD-10-CM | POA: Diagnosis present

## 2018-12-30 DIAGNOSIS — I428 Other cardiomyopathies: Secondary | ICD-10-CM | POA: Diagnosis not present

## 2018-12-30 DIAGNOSIS — I5022 Chronic systolic (congestive) heart failure: Secondary | ICD-10-CM | POA: Diagnosis not present

## 2018-12-30 DIAGNOSIS — N183 Chronic kidney disease, stage 3 unspecified: Secondary | ICD-10-CM | POA: Diagnosis not present

## 2018-12-30 DIAGNOSIS — I130 Hypertensive heart and chronic kidney disease with heart failure and stage 1 through stage 4 chronic kidney disease, or unspecified chronic kidney disease: Secondary | ICD-10-CM | POA: Diagnosis not present

## 2018-12-30 DIAGNOSIS — I739 Peripheral vascular disease, unspecified: Secondary | ICD-10-CM | POA: Diagnosis not present

## 2018-12-30 DIAGNOSIS — R109 Unspecified abdominal pain: Secondary | ICD-10-CM | POA: Diagnosis not present

## 2018-12-30 DIAGNOSIS — I214 Non-ST elevation (NSTEMI) myocardial infarction: Secondary | ICD-10-CM | POA: Diagnosis not present

## 2018-12-30 DIAGNOSIS — I2510 Atherosclerotic heart disease of native coronary artery without angina pectoris: Secondary | ICD-10-CM | POA: Diagnosis not present

## 2018-12-30 DIAGNOSIS — E785 Hyperlipidemia, unspecified: Secondary | ICD-10-CM | POA: Diagnosis not present

## 2018-12-30 DIAGNOSIS — Z20828 Contact with and (suspected) exposure to other viral communicable diseases: Secondary | ICD-10-CM | POA: Diagnosis not present

## 2018-12-30 DIAGNOSIS — F17210 Nicotine dependence, cigarettes, uncomplicated: Secondary | ICD-10-CM | POA: Diagnosis not present

## 2018-12-30 DIAGNOSIS — K3533 Acute appendicitis with perforation and localized peritonitis, with abscess: Secondary | ICD-10-CM | POA: Diagnosis not present

## 2018-12-30 HISTORY — PX: LAPAROSCOPIC LYSIS OF ADHESIONS: SHX5905

## 2018-12-30 HISTORY — PX: LAPAROSCOPIC APPENDECTOMY: SHX408

## 2018-12-30 HISTORY — DX: Unspecified acute appendicitis: K35.80

## 2018-12-30 LAB — TROPONIN I (HIGH SENSITIVITY): Troponin I (High Sensitivity): 4 ng/L (ref <18)

## 2018-12-30 LAB — RESPIRATORY PANEL BY RT PCR (FLU A&B, COVID)
Influenza A by PCR: NEGATIVE
Influenza B by PCR: NEGATIVE
SARS Coronavirus 2 by RT PCR: NEGATIVE

## 2018-12-30 SURGERY — APPENDECTOMY, LAPAROSCOPIC
Anesthesia: General | Site: Abdomen

## 2018-12-30 MED ORDER — ONDANSETRON HCL 4 MG/2ML IJ SOLN
INTRAMUSCULAR | Status: AC
  Filled 2018-12-30: qty 2

## 2018-12-30 MED ORDER — CARVEDILOL 3.125 MG PO TABS
ORAL_TABLET | ORAL | Status: AC
  Administered 2018-12-30: 3.125 mg via ORAL
  Filled 2018-12-30: qty 1

## 2018-12-30 MED ORDER — SUGAMMADEX SODIUM 200 MG/2ML IV SOLN
INTRAVENOUS | Status: DC | PRN
  Administered 2018-12-30: 200 mg via INTRAVENOUS

## 2018-12-30 MED ORDER — CHLORHEXIDINE GLUCONATE CLOTH 2 % EX PADS: 6.0000 | MEDICATED_PAD | Freq: Once | CUTANEOUS | Status: DC

## 2018-12-30 MED ORDER — BUPIVACAINE-EPINEPHRINE (PF) 0.25% -1:200000 IJ SOLN
INTRAMUSCULAR | Status: AC
  Filled 2018-12-30: qty 30

## 2018-12-30 MED ORDER — MEPERIDINE HCL 25 MG/ML IJ SOLN: 6.2500 mg | INTRAMUSCULAR | Status: DC | PRN

## 2018-12-30 MED ORDER — PANTOPRAZOLE SODIUM 40 MG PO TBEC
40.0000 mg | DELAYED_RELEASE_TABLET | Freq: Every day | ORAL | Status: DC
  Administered 2018-12-30 – 2018-12-31 (×2): 40 mg via ORAL
  Filled 2018-12-30 (×2): qty 1

## 2018-12-30 MED ORDER — SPIRONOLACTONE 25 MG PO TABS
25.0000 mg | ORAL_TABLET | Freq: Every day | ORAL | Status: DC
  Administered 2018-12-30 – 2018-12-31 (×2): 25 mg via ORAL
  Filled 2018-12-30 (×2): qty 1

## 2018-12-30 MED ORDER — FENTANYL CITRATE (PF) 250 MCG/5ML IJ SOLN
INTRAMUSCULAR | Status: DC | PRN
  Administered 2018-12-30 (×3): 50 ug via INTRAVENOUS
  Administered 2018-12-30: 100 ug via INTRAVENOUS

## 2018-12-30 MED ORDER — OXYCODONE HCL 5 MG PO TABS: 5.0000 mg | ORAL_TABLET | Freq: Once | ORAL | Status: DC | PRN

## 2018-12-30 MED ORDER — LIDOCAINE 2% (20 MG/ML) 5 ML SYRINGE
INTRAMUSCULAR | Status: DC | PRN
  Administered 2018-12-30: 60 mg via INTRAVENOUS

## 2018-12-30 MED ORDER — PHENYLEPHRINE 40 MCG/ML (10ML) SYRINGE FOR IV PUSH (FOR BLOOD PRESSURE SUPPORT)
PREFILLED_SYRINGE | INTRAVENOUS | Status: DC | PRN
  Administered 2018-12-30: 80 ug via INTRAVENOUS

## 2018-12-30 MED ORDER — CARVEDILOL 3.125 MG PO TABS: 3.1250 mg | ORAL_TABLET | Freq: Once | ORAL | Status: AC

## 2018-12-30 MED ORDER — SACUBITRIL-VALSARTAN 49-51 MG PO TABS
1.0000 | ORAL_TABLET | Freq: Two times a day (BID) | ORAL | Status: DC
  Administered 2018-12-31: 1 via ORAL
  Filled 2018-12-30 (×2): qty 1

## 2018-12-30 MED ORDER — ESMOLOL HCL 100 MG/10ML IV SOLN
INTRAVENOUS | Status: AC
  Filled 2018-12-30: qty 10

## 2018-12-30 MED ORDER — BUPIVACAINE-EPINEPHRINE 0.25% -1:200000 IJ SOLN
INTRAMUSCULAR | Status: DC | PRN
  Administered 2018-12-30: 27 mL
  Administered 2018-12-30: 20 mL

## 2018-12-30 MED ORDER — ATORVASTATIN CALCIUM 10 MG PO TABS
20.0000 mg | ORAL_TABLET | Freq: Every day | ORAL | Status: DC
  Administered 2018-12-30: 20 mg via ORAL
  Filled 2018-12-30: qty 2

## 2018-12-30 MED ORDER — ACETAMINOPHEN 500 MG PO TABS
1000.0000 mg | ORAL_TABLET | ORAL | Status: AC
  Administered 2018-12-30: 1000 mg via ORAL
  Filled 2018-12-30: qty 2

## 2018-12-30 MED ORDER — ACETAMINOPHEN 500 MG PO TABS
1000.0000 mg | ORAL_TABLET | Freq: Four times a day (QID) | ORAL | Status: DC
  Administered 2018-12-30 – 2018-12-31 (×2): 1000 mg via ORAL
  Filled 2018-12-30 (×3): qty 2

## 2018-12-30 MED ORDER — SODIUM CHLORIDE 0.9 % IV SOLN
1.0000 g | Freq: Once | INTRAVENOUS | Status: AC
  Administered 2018-12-30: 1 g via INTRAVENOUS
  Filled 2018-12-30: qty 10

## 2018-12-30 MED ORDER — ONDANSETRON HCL 4 MG/2ML IJ SOLN
INTRAMUSCULAR | Status: DC | PRN
  Administered 2018-12-30: 4 mg via INTRAVENOUS

## 2018-12-30 MED ORDER — MIDAZOLAM HCL 2 MG/2ML IJ SOLN
INTRAMUSCULAR | Status: DC | PRN
  Administered 2018-12-30: 2 mg via INTRAVENOUS

## 2018-12-30 MED ORDER — ESMOLOL HCL 100 MG/10ML IV SOLN
INTRAVENOUS | Status: DC | PRN
  Administered 2018-12-30 (×3): 10 mg via INTRAVENOUS

## 2018-12-30 MED ORDER — ACETAMINOPHEN 500 MG PO TABS: 1000.0000 mg | ORAL_TABLET | Freq: Four times a day (QID) | ORAL | Status: DC

## 2018-12-30 MED ORDER — OXYCODONE HCL 5 MG PO TABS
5.0000 mg | ORAL_TABLET | ORAL | Status: DC | PRN
  Administered 2018-12-31 (×2): 10 mg via ORAL
  Filled 2018-12-30 (×2): qty 2

## 2018-12-30 MED ORDER — CARVEDILOL 3.125 MG PO TABS
3.1250 mg | ORAL_TABLET | Freq: Two times a day (BID) | ORAL | Status: DC
  Administered 2018-12-30 – 2018-12-31 (×2): 3.125 mg via ORAL
  Filled 2018-12-30 (×2): qty 1

## 2018-12-30 MED ORDER — HYDROMORPHONE HCL 1 MG/ML IJ SOLN: 0.2500 mg | INTRAMUSCULAR | Status: DC | PRN

## 2018-12-30 MED ORDER — DEXAMETHASONE SODIUM PHOSPHATE 10 MG/ML IJ SOLN
INTRAMUSCULAR | Status: DC | PRN
  Administered 2018-12-30: 5 mg via INTRAVENOUS

## 2018-12-30 MED ORDER — 0.9 % SODIUM CHLORIDE (POUR BTL) OPTIME
TOPICAL | Status: DC | PRN
  Administered 2018-12-30: 1000 mL

## 2018-12-30 MED ORDER — KETOROLAC TROMETHAMINE 30 MG/ML IJ SOLN
30.0000 mg | Freq: Once | INTRAMUSCULAR | Status: AC
  Administered 2018-12-30: 30 mg via INTRAVENOUS
  Filled 2018-12-30: qty 1

## 2018-12-30 MED ORDER — ACETAMINOPHEN 500 MG PO TABS: 1000.0000 mg | ORAL_TABLET | Freq: Once | ORAL | Status: DC

## 2018-12-30 MED ORDER — DEXAMETHASONE SODIUM PHOSPHATE 10 MG/ML IJ SOLN
INTRAMUSCULAR | Status: AC
  Filled 2018-12-30: qty 1

## 2018-12-30 MED ORDER — PROPOFOL 10 MG/ML IV BOLUS
INTRAVENOUS | Status: DC | PRN
  Administered 2018-12-30: 100 mg via INTRAVENOUS
  Administered 2018-12-30: 50 mg via INTRAVENOUS
  Administered 2018-12-30: 40 mg via INTRAVENOUS

## 2018-12-30 MED ORDER — BUPIVACAINE-EPINEPHRINE 0.5% -1:200000 IJ SOLN
INTRAMUSCULAR | Status: AC
  Filled 2018-12-30: qty 1

## 2018-12-30 MED ORDER — LACTATED RINGERS IV SOLN: INTRAVENOUS | Status: DC | PRN

## 2018-12-30 MED ORDER — SODIUM CHLORIDE 0.9 % IV SOLN: INTRAVENOUS | Status: DC

## 2018-12-30 MED ORDER — EPHEDRINE 5 MG/ML INJ
INTRAVENOUS | Status: AC
  Filled 2018-12-30: qty 10

## 2018-12-30 MED ORDER — ENOXAPARIN SODIUM 40 MG/0.4ML ~~LOC~~ SOLN
40.0000 mg | SUBCUTANEOUS | Status: DC
  Administered 2018-12-31: 40 mg via SUBCUTANEOUS
  Filled 2018-12-30: qty 0.4

## 2018-12-30 MED ORDER — ONDANSETRON HCL 4 MG/2ML IJ SOLN: 4.0000 mg | Freq: Once | INTRAMUSCULAR | Status: DC | PRN

## 2018-12-30 MED ORDER — OXYCODONE HCL 5 MG/5ML PO SOLN: 5.0000 mg | Freq: Once | ORAL | Status: DC | PRN

## 2018-12-30 MED ORDER — MIDAZOLAM HCL 2 MG/2ML IJ SOLN
INTRAMUSCULAR | Status: AC
  Filled 2018-12-30: qty 2

## 2018-12-30 MED ORDER — GABAPENTIN 300 MG PO CAPS
300.0000 mg | ORAL_CAPSULE | ORAL | Status: AC
  Administered 2018-12-30: 300 mg via ORAL
  Filled 2018-12-30: qty 1

## 2018-12-30 MED ORDER — ROCURONIUM BROMIDE 10 MG/ML (PF) SYRINGE
PREFILLED_SYRINGE | INTRAVENOUS | Status: AC
  Filled 2018-12-30: qty 10

## 2018-12-30 MED ORDER — MORPHINE SULFATE (PF) 4 MG/ML IV SOLN
4.0000 mg | Freq: Once | INTRAVENOUS | Status: DC
  Filled 2018-12-30 (×2): qty 1

## 2018-12-30 MED ORDER — DEXTROSE-NACL 5-0.45 % IV SOLN: INTRAVENOUS | Status: DC

## 2018-12-30 MED ORDER — PHENYLEPHRINE 40 MCG/ML (10ML) SYRINGE FOR IV PUSH (FOR BLOOD PRESSURE SUPPORT)
PREFILLED_SYRINGE | INTRAVENOUS | Status: AC
  Filled 2018-12-30: qty 10

## 2018-12-30 MED ORDER — ONDANSETRON HCL 4 MG/2ML IJ SOLN
4.0000 mg | Freq: Once | INTRAMUSCULAR | Status: AC
  Administered 2018-12-30: 4 mg via INTRAVENOUS
  Filled 2018-12-30: qty 2

## 2018-12-30 MED ORDER — FENTANYL CITRATE (PF) 250 MCG/5ML IJ SOLN
INTRAMUSCULAR | Status: AC
  Filled 2018-12-30: qty 5

## 2018-12-30 MED ORDER — ROCURONIUM BROMIDE 10 MG/ML (PF) SYRINGE
PREFILLED_SYRINGE | INTRAVENOUS | Status: DC | PRN
  Administered 2018-12-30: 60 mg via INTRAVENOUS

## 2018-12-30 MED ORDER — SODIUM CHLORIDE 0.9 % IR SOLN
Status: DC | PRN
  Administered 2018-12-30: 1000 mL

## 2018-12-30 MED ORDER — METRONIDAZOLE IN NACL 5-0.79 MG/ML-% IV SOLN
500.0000 mg | Freq: Once | INTRAVENOUS | Status: AC
  Administered 2018-12-30: 500 mg via INTRAVENOUS
  Filled 2018-12-30: qty 100

## 2018-12-30 MED ORDER — EPHEDRINE SULFATE-NACL 50-0.9 MG/10ML-% IV SOSY
PREFILLED_SYRINGE | INTRAVENOUS | Status: DC | PRN
  Administered 2018-12-30: 10 mg via INTRAVENOUS
  Administered 2018-12-30: 5 mg via INTRAVENOUS

## 2018-12-30 MED ORDER — STERILE WATER FOR IRRIGATION IR SOLN
Status: DC | PRN
  Administered 2018-12-30: 1000 mL

## 2018-12-30 MED ORDER — LIDOCAINE 2% (20 MG/ML) 5 ML SYRINGE
INTRAMUSCULAR | Status: AC
  Filled 2018-12-30: qty 5

## 2018-12-30 MED ORDER — ONDANSETRON HCL 4 MG/2ML IJ SOLN
4.0000 mg | Freq: Four times a day (QID) | INTRAMUSCULAR | Status: DC | PRN
  Administered 2018-12-31: 4 mg via INTRAVENOUS
  Filled 2018-12-30: qty 2

## 2018-12-30 SURGICAL SUPPLY — 39 items
APPLIER CLIP 5 13 M/L LIGAMAX5 (MISCELLANEOUS)
CANISTER SUCT 3000ML PPV (MISCELLANEOUS) ×3 IMPLANT
CHLORAPREP W/TINT 26 (MISCELLANEOUS) ×3 IMPLANT
CLIP APPLIE 5 13 M/L LIGAMAX5 (MISCELLANEOUS) IMPLANT
CLIP VESOLOCK XL 6/CT (CLIP) ×3 IMPLANT
COVER SURGICAL LIGHT HANDLE (MISCELLANEOUS) ×3 IMPLANT
COVER WAND RF STERILE (DRAPES) IMPLANT
DERMABOND ADVANCED (GAUZE/BANDAGES/DRESSINGS) ×2
DERMABOND ADVANCED .7 DNX12 (GAUZE/BANDAGES/DRESSINGS) ×1 IMPLANT
ELECT REM PT RETURN 9FT ADLT (ELECTROSURGICAL) ×3
ELECTRODE REM PT RTRN 9FT ADLT (ELECTROSURGICAL) ×1 IMPLANT
ENDOLOOP SUT PDS II  0 18 (SUTURE)
ENDOLOOP SUT PDS II 0 18 (SUTURE) IMPLANT
GLOVE BIOGEL PI IND STRL 7.0 (GLOVE) ×1 IMPLANT
GLOVE BIOGEL PI INDICATOR 7.0 (GLOVE) ×2
GLOVE SURG SS PI 7.0 STRL IVOR (GLOVE) ×3 IMPLANT
GOWN STRL REUS W/ TWL LRG LVL3 (GOWN DISPOSABLE) ×3 IMPLANT
GOWN STRL REUS W/TWL LRG LVL3 (GOWN DISPOSABLE) ×6
GRASPER SUT TROCAR 14GX15 (MISCELLANEOUS) ×3 IMPLANT
KIT BASIN OR (CUSTOM PROCEDURE TRAY) ×3 IMPLANT
KIT TURNOVER KIT B (KITS) ×3 IMPLANT
NEEDLE 22X1 1/2 (OR ONLY) (NEEDLE) ×3 IMPLANT
NS IRRIG 1000ML POUR BTL (IV SOLUTION) ×3 IMPLANT
PAD ARMBOARD 7.5X6 YLW CONV (MISCELLANEOUS) ×6 IMPLANT
POUCH RETRIEVAL ECOSAC 10 (ENDOMECHANICALS) ×1 IMPLANT
POUCH RETRIEVAL ECOSAC 10MM (ENDOMECHANICALS) ×2
SCISSORS LAP 5X35 DISP (ENDOMECHANICALS) ×3 IMPLANT
SET IRRIG TUBING LAPAROSCOPIC (IRRIGATION / IRRIGATOR) ×3 IMPLANT
SET TUBE SMOKE EVAC HIGH FLOW (TUBING) ×3 IMPLANT
SLEEVE ENDOPATH XCEL 5M (ENDOMECHANICALS) ×3 IMPLANT
SPECIMEN JAR SMALL (MISCELLANEOUS) ×3 IMPLANT
SUT MNCRL AB 4-0 PS2 18 (SUTURE) ×3 IMPLANT
TOWEL GREEN STERILE (TOWEL DISPOSABLE) ×3 IMPLANT
TOWEL GREEN STERILE FF (TOWEL DISPOSABLE) ×3 IMPLANT
TRAY FOLEY W/BAG SLVR 14FR (SET/KITS/TRAYS/PACK) IMPLANT
TRAY LAPAROSCOPIC MC (CUSTOM PROCEDURE TRAY) ×3 IMPLANT
TROCAR XCEL NON-BLD 11X100MML (ENDOMECHANICALS) ×3 IMPLANT
TROCAR XCEL NON-BLD 5MMX100MML (ENDOMECHANICALS) ×3 IMPLANT
WATER STERILE IRR 1000ML POUR (IV SOLUTION) ×3 IMPLANT

## 2018-12-30 NOTE — Discharge Instructions (Signed)
[facility]  LAPAROSCOPIC SURGERY: POST OP INSTRUCTIONS Always review your discharge instruction sheet given to you by the facility where your surgery was performed. IF YOU HAVE DISABILITY OR FAMILY LEAVE FORMS, YOU MUST BRING THEM TO THE OFFICE FOR PROCESSING.   DO NOT GIVE THEM TO YOUR DOCTOR.  PAIN CONTROL  1. First take acetaminophen (Tylenol) AND/or ibuprofen (Advil) to control your pain after surgery.  Follow directions on package.  Taking acetaminophen (Tylenol) and/or ibuprofen (Advil) regularly after surgery will help to control your pain and lower the amount of prescription pain medication you may need.  You should not take more than 4,000 mg (4 grams) of acetaminophen (Tylenol) in 24 hours.  You should not take ibuprofen (Advil), aleve, motrin, naprosyn or other NSAIDS if you have a history of stomach ulcers or chronic kidney disease.  2. A prescription for pain medication may be given to you upon discharge.  Take your pain medication as prescribed, if you still have uncontrolled pain after taking acetaminophen (Tylenol) or ibuprofen (Advil). 3. Use ice packs to help control pain. 4. If you need a refill on your pain medication, please contact your pharmacy.  They will contact our office to request authorization. Prescriptions will not be filled after 5pm or on week-ends.  HOME MEDICATIONS 5. Take your usually prescribed medications unless otherwise directed.  DIET 6. You should follow a light diet the first few days after arrival home.  Be sure to include lots of fluids daily. Avoid fatty, fried foods.   CONSTIPATION 7. It is common to experience some constipation after surgery and if you are taking pain medication.  Increasing fluid intake and taking a stool softener (such as Colace) will usually help or prevent this problem from occurring.  A mild laxative (Milk of Magnesia or Miralax) should be taken according to package instructions if there are no bowel  movements after 48 hours.  WOUND/INCISION CARE 8. Most patients will experience some swelling and bruising in the area of the incisions.  Ice packs will help.  Swelling and bruising can take several days to resolve.  9. Unless discharge instructions indicate otherwise, follow guidelines below  a. STERI-STRIPS - you may remove your outer bandages 48 hours after surgery, and you may shower at that time.  You have steri-strips (small skin tapes) in place directly over the incision.  These strips should be left on the skin for 7-10 days.   b. DERMABOND/SKIN GLUE - you may shower in 24 hours.  The glue will flake off over the next 2-3 weeks. 10. Any sutures or staples will be removed at the office during your follow-up visit.  ACTIVITIES 11. You may resume regular (light) daily activities beginning the next day--such as daily self-care, walking, climbing stairs--gradually increasing activities as tolerated.  You may have sexual intercourse when it is comfortable.  Refrain from any heavy lifting or straining until approved by your doctor. a. You may drive when you are no longer taking prescription pain medication, you can comfortably wear a seatbelt, and you can safely maneuver your car and apply brakes.  FOLLOW-UP 12. You should see your doctor in the office for a follow-up appointment approximately 2-3 weeks after your surgery.  You should have been given your post-op/follow-up appointment when your surgery was scheduled.  If you did not receive a post-op/follow-up appointment, make sure that you call for this appointment within a day or two after you arrive home to insure a convenient appointment time.  OTHER INSTRUCTIONS  WHEN TO CALL YOUR DOCTOR: 1. Fever over 101.0 2. Inability to urinate 3. Continued bleeding from incision. 4. Increased pain, redness, or drainage from the incision. 5. Increasing abdominal pain  The clinic staff is available to answer your questions during regular business  hours.  Please don't hesitate to call and ask to speak to one of the nurses for clinical concerns.  If you have a medical emergency, go to the nearest emergency room or call 911.  A surgeon from [facility] is always on call at the hospital. [address] [place], [place], [place]  [address] ? [address], [place], [place]   [id number] [phone] ? [phone] ? FAX [phone]

## 2018-12-30 NOTE — Anesthesia Postprocedure Evaluation (Signed)
Anesthesia Post Note  Patient: Andres Boyd  Procedure(s) Performed: APPENDECTOMY LAPAROSCOPIC (N/A Abdomen) LAPAROSCOPIC LYSIS OF ADHESIONS (N/A Abdomen)     Patient location during evaluation: PACU Anesthesia Type: General Level of consciousness: awake and alert, oriented and patient cooperative Pain management: pain level controlled Vital Signs Assessment: post-procedure vital signs reviewed and stable Respiratory status: spontaneous breathing, nonlabored ventilation and respiratory function stable Cardiovascular status: blood pressure returned to baseline and stable Postop Assessment: no apparent nausea or vomiting Anesthetic complications: no    Last Vitals:  Vitals:   12/30/18 1140 12/30/18 1155  BP: 122/69 122/67  Pulse: 76 72  Resp: 15 15  Temp:  (!) 36.4 C  SpO2: 98% 99%    Last Pain:  Vitals:   12/30/18 1155  TempSrc:   PainSc: 0-No pain                 Pervis Hocking

## 2018-12-30 NOTE — H&P (Signed)
[facility] Admission Note  Andres Boyd January 29, 1955  [id number].    Requesting MD: Veryl Speak Chief Complaint/Reason for Consult: appendicitis  HPI:  Andres Boyd is a 63yo male PMH NICM/chronic systolic CHF (EF Q000111Q on 10/2018), nonobstructive CAD, HTN, HLD, alcohol/tobacco abuse who presented to [facility] last night complaining of posterior right chest discomfort. States that the pain started around 1700 last night. It radiates into his right hemiabdomen. Associated with nausea and vomiting. Denies fever or chills. Pain is constant and nothing has helped to alleviate it. States that he consumed large volume alcohol the night before and thought this contributed to his symptoms.  Cardiac work up in ED with troponin 3 and no acute changes on EKG. Chest xray also without acute findings. CT scan showed acute appendicitis with appendicolith. WBC 8. General surgery asked to see.  He has had nothing to eat/drink today  Abdominal surgical history: none Anticoagulants: takes ASA 81mg  Smokes 1/2 pack cigarettes per week Drinks 64oz beer 2-3x/week Lives at home with his son  ROS: Review of Systems  Constitutional: Negative.   HENT: Negative.   Eyes: Negative.   Respiratory: Positive for shortness of breath. Negative for cough and wheezing.   Cardiovascular: Positive for chest pain.  Gastrointestinal: Positive for abdominal pain, nausea and vomiting. Negative for constipation and diarrhea.  Genitourinary: Negative.   Musculoskeletal: Positive for back pain.  Skin: Negative.   Neurological: Negative.     All systems reviewed and otherwise negative except for as above  Family History  Problem Relation Age of Onset  . Diabetes Mother   . Hypertension Mother   . Diabetes Father   . Diabetes Maternal Grandmother   . Hypertension Maternal Grandmother   . Diabetes Maternal Grandfather   . Hypertension Maternal Grandfather     Past Medical History:  Diagnosis Date  . 1st  degree AV block   . CKD (chronic kidney disease), stage III   . ETOH abuse   . Hyperlipidemia   . Hypertension   . Hyponatremia   . Mild CAD   . NICM (nonischemic cardiomyopathy) ([place])   . PAD (peripheral artery disease) ([place])    a. LE vasc testing 03/2017 - LE duplex for claudication 03/2017 showing 30-49% stenosis in R popliteal and total occlusion in posterior tibial. Managed medically.  . Prolonged QT interval   . Sinus bradycardia   . Tobacco abuse     Past Surgical History:  Procedure Laterality Date  . CARDIAC CATHETERIZATION N/A 07/19/2015   Procedure: Left Heart Cath and Coronary Angiography;  Surgeon: Jettie Booze, MD;  Location: [place] [facility];  Service: Cardiovascular;  Laterality: N/A;  . LEFT HEART CATH AND CORONARY ANGIOGRAPHY N/A 07/04/2016   Procedure: Left Heart Cath and Coronary Angiography;  Surgeon: Jettie Booze, MD;  Location: [place] [facility];  Service: Cardiovascular;  Laterality: N/A;  . LEFT HEART CATH AND CORONARY ANGIOGRAPHY N/A 09/06/2016   Procedure: LEFT HEART CATH AND CORONARY ANGIOGRAPHY;  Surgeon: Sherren Mocha, MD;  Location: [place] [facility];  Service: Cardiovascular;  Laterality: N/A;    Social History:  reports that he has been smoking cigarettes. He has a 36.00 pack-year smoking history. He has never used smokeless tobacco. He reports current alcohol use of about 2.0 - 3.0 standard drinks of alcohol per week. He reports current drug use. Drug: Marijuana.  Allergies: No Known Allergies  (Not in a hospital admission)   Prior to Admission medications   Medication Sig Start Date  End Date Taking? Authorizing Provider  aspirin EC 81 MG EC tablet Take 1 tablet (81 mg total) by mouth daily. 09/11/16   Barton Dubois, MD  atorvastatin (LIPITOR) 20 MG tablet Take 1 tablet (20 mg total) by mouth daily at 6 PM. 12/18/17   Dunn, Nedra Hai, PA-C  carvedilol (COREG) 3.125 MG tablet TAKE 1 TABLET(3.125 MG) BY MOUTH TWICE DAILY 06/05/18    Burtis Junes, NP  Multiple Vitamin (MULTIVITAMIN WITH MINERALS) TABS tablet Take 1 tablet by mouth daily.    [provider]  nitroGLYCERIN (NITROSTAT) 0.4 MG SL tablet Place 1 tablet (0.4 mg total) under the tongue every 5 (five) minutes as needed for chest pain. 03/09/17   Dorothy Spark, MD  pantoprazole (PROTONIX) 40 MG tablet Take 1 tablet (40 mg total) by mouth daily. 12/18/17   Dunn, Nedra Hai, PA-C  sacubitril-valsartan (ENTRESTO) 49-51 MG Take 1 tablet by mouth 2 (two) times daily. 10/23/18   Bensimhon, Shaune Pascal, MD  spironolactone (ALDACTONE) 25 MG tablet TAKE 1 TABLET BY MOUTH ONCE DAILY 06/05/18   Dunn, Nedra Hai, PA-C    Blood pressure (!) 141/87, pulse 70, temperature 98.2 F (36.8 C), temperature source Oral, resp. rate 13, SpO2 100 %. Physical Exam: General: pleasant, WD/WN AA male who is laying in bed in NAD HEENT: head is normocephalic, atraumatic.  Sclera are noninjected.  Pupils equal and round.  Ears and nose without any masses or lesions.  Mouth is pink and moist. Dentition fair Heart: regular, rate, and rhythm.  No obvious murmurs, gallops, or rubs noted.  Palpable pedal pulses bilaterally Lungs: CTAB, no wheezes, rhonchi, or rales noted.  Respiratory effort nonlabored Abd: soft, ND, +BS, no masses, hernias, or organomegaly. Mild TTP RLQ without rebound or guarding MS: calves soft and nontender without edema Skin: warm and dry with no masses, lesions, or rashes Psych: A&Ox3 with an appropriate affect. Neuro: cranial nerves grossly intact, extremity CSM intact bilaterally, normal speech  Results for orders placed or performed during the hospital encounter of 12/29/18 (from the past 48 hour(s))  Basic metabolic panel     Status: Abnormal   Collection Time: 12/29/18 10:07 PM  Result Value Ref Range   Sodium 136 135 - 145 mmol/L   Potassium 4.0 3.5 - 5.1 mmol/L   Chloride 106 98 - 111 mmol/L   CO2 22 22 - 32 mmol/L   Glucose, Bld 142 (H) 70 - 99 mg/dL   BUN  14 8 - 23 mg/dL   Creatinine, Ser 1.24 0.61 - 1.24 mg/dL   Calcium 9.3 8.9 - 10.3 mg/dL   GFR calc non Af Amer >60 >60 mL/min   GFR calc Af Amer >60 >60 mL/min   Anion gap 8 5 - 15    Comment: Performed at [facility], [place] [address] [place] [address]  CBC     Status: None   Collection Time: 12/29/18 10:07 PM  Result Value Ref Range   WBC 8.0 4.0 - 10.5 K/uL   RBC 4.50 4.22 - 5.81 MIL/uL   Hemoglobin 14.4 13.0 - 17.0 g/dL   HCT 43.8 39.0 - 52.0 %   MCV 97.3 80.0 - 100.0 fL   MCH 32.0 26.0 - 34.0 pg   MCHC 32.9 30.0 - 36.0 g/dL   RDW 12.7 11.5 - 15.5 %   Platelets 253 150 - 400 K/uL   nRBC 0.0 0.0 - 0.2 %    Comment: Performed at [facility], [address] [place], [place] [address]  Troponin I (High Sensitivity)     Status: None   Collection Time: 12/29/18 10:07 PM  Result Value Ref Range   Troponin I (High Sensitivity) 3 <18 ng/L    Comment: (NOTE) Elevated high sensitivity troponin I (hsTnI) values and significant  changes across serial measurements may suggest ACS but many other  chronic and acute conditions are known to elevate hsTnI results.  Refer to the "Links" section for chest pain algorithms and additional  guidance. Performed at [facility], [place] [address], [place], [place] [address]   Troponin I (High Sensitivity)     Status: None   Collection Time: 12/30/18  4:33 AM  Result Value Ref Range   Troponin I (High Sensitivity) 4 <18 ng/L    Comment: (NOTE) Elevated high sensitivity troponin I (hsTnI) values and significant  changes across serial measurements may suggest ACS but many other  chronic and acute conditions are known to elevate hsTnI results.  Refer to the "Links" section for chest pain algorithms and additional  guidance. Performed at [facility], [place] [address], [place], [place] [address]    DG Chest 2 View  Result Date: 12/29/2018 CLINICAL DATA:  63 year old male with chest pain. EXAM: CHEST - 2 VIEW  COMPARISON:  Chest radiograph dated 09/05/2016. FINDINGS: No focal consolidation, pleural effusion, or pneumothorax. Mild emphysema. The cardiac silhouette is within normal limits. No acute osseous pathology. IMPRESSION: No active cardiopulmonary disease. Electronically Signed   By: Anner Crete M.D.   On: 12/29/2018 22:19   CT Renal Stone Study  Result Date: 12/30/2018 CLINICAL DATA:  Right flank pain with kidney stone suspected. EXAM: CT ABDOMEN AND PELVIS WITHOUT CONTRAST TECHNIQUE: Multidetector CT imaging of the abdomen and pelvis was performed following the standard protocol without IV contrast. COMPARISON:  07/01/2007 FINDINGS: Lower chest:  No contributory findings. Hepatobiliary: No focal liver abnormality.Punctate calcified gallstone. No evidence of gallbladder inflammation. Pancreas: Unremarkable. Spleen: Unremarkable. Adrenals/Urinary Tract: Negative adrenals. No hydronephrosis or stone. Unremarkable bladder. Stomach/Bowel: Thickened appendix with mild fat edema around an appendicolith which measures 9 mm. Outer walls of the appendix measures up to 16 mm in diameter. The appendix is in expected location extending inferiorly from the cecum. No evidence of collection. Vascular/Lymphatic: No acute vascular abnormality. Atherosclerotic calcification no mass or adenopathy. Reproductive:No pathologic findings. Other: No ascites or pneumoperitoneum. Musculoskeletal: No acute abnormalities. IMPRESSION: 1. Acute appendicitis with appendicolith. 2. Tiny calcified gallstone. Electronically Signed   By: Monte Fantasia M.D.   On: 12/30/2018 07:21      Assessment/Plan NICM/chronic systolic CHF (EF Q000111Q on 10/2018) Nonobstructive CAD HTN HLD Alcohol/tobacco abuse  Acute appendicitis - Patient with clinical and radiographic findings consistent with acute appendicitis. He is receiving rocephin/flagyl. Will plan for laparoscopic appendectomy later today. Keep NPO. He may be able to be discharged  home later today.    Wellington Hampshire, [facility] 12/30/2018, 8:18 AM Please see Amion for pager number during day hours 7:00am-4:30pm

## 2018-12-30 NOTE — ED Provider Notes (Signed)
[facility] EMERGENCY DEPARTMENT Provider Note   CSN: [id number] Arrival date & time: 12/29/18  2138     History No chief complaint on file.   [address]es [address] is a 63 y.o. male.  Patient is a 63 year old male with past medical history of nonischemic cardiomyopathy, peripheral artery disease, hypertension, hyperlipidemia, chronic renal insufficiency, and alcohol use.  He presents today for evaluation of chest discomfort.  Patient describes a pressure to the right side of his anterior chest that started at approximately 5:00 this afternoon.  Patient states the night before he consumed a large quantity of alcohol and believes this may have contributed to his discomfort.  He denies any difficulty breathing, diaphoresis, or radiation to the arm or jaw.  He denies any exertional symptoms.  He had an episode of vomiting while in the waiting room and states that his symptoms are now completely gone.  The history is provided by the patient.       Past Medical History:  Diagnosis Date  . 1st degree AV block   . CKD (chronic kidney disease), stage III   . ETOH abuse   . Hyperlipidemia   . Hypertension   . Hyponatremia   . Mild CAD   . NICM (nonischemic cardiomyopathy) ([place])   . PAD (peripheral artery disease) ([place])    a. LE vasc testing 03/2017 - LE duplex for claudication 03/2017 showing 30-49% stenosis in R popliteal and total occlusion in posterior tibial. Managed medically.  . Prolonged QT interval   . Sinus bradycardia   . Tobacco abuse     Patient Active Problem List   Diagnosis Date Noted  . AKI (acute kidney injury) ([place])   . Hyponatremia   . Acute systolic CHF (congestive heart failure) ([place])   . Prolonged Q-T interval on ECG 09/06/2016  . Atypical chest pain   . Elevated troponin   . AV block, 1st degree 07/02/2016  . Chest pain 02/10/2016  . Claudication ([place]) 02/10/2016  . Hypotension 07/21/2015  . History of prolonged Q-T interval on ECG 07/21/2015    . Alcohol abuse 07/21/2015  . Coronary artery disease 07/20/2015  . HLD (hyperlipidemia) 07/19/2015  . Hypertension 07/19/2015  . Tobacco use disorder 07/19/2015  . NSTEMI (non-ST elevated myocardial infarction) ([place]) 07/18/2015    Past Surgical History:  Procedure Laterality Date  . CARDIAC CATHETERIZATION N/A 07/19/2015   Procedure: Left Heart Cath and Coronary Angiography;  Surgeon: Jettie Booze, MD;  Location: [place] [facility];  Service: Cardiovascular;  Laterality: N/A;  . LEFT HEART CATH AND CORONARY ANGIOGRAPHY N/A 07/04/2016   Procedure: Left Heart Cath and Coronary Angiography;  Surgeon: Jettie Booze, MD;  Location: [place] [facility];  Service: Cardiovascular;  Laterality: N/A;  . LEFT HEART CATH AND CORONARY ANGIOGRAPHY N/A 09/06/2016   Procedure: LEFT HEART CATH AND CORONARY ANGIOGRAPHY;  Surgeon: Sherren Mocha, MD;  Location: [place] [facility];  Service: Cardiovascular;  Laterality: N/A;       Family History  Problem Relation Age of Onset  . Diabetes Mother   . Hypertension Mother   . Diabetes Father   . Diabetes Maternal Grandmother   . Hypertension Maternal Grandmother   . Diabetes Maternal Grandfather   . Hypertension Maternal Grandfather     Social History   Tobacco Use  . Smoking status: Current Every Day Smoker    Packs/day: 1.00    Years: 36.00    Pack years: 36.00    Types: Cigarettes  .  Smokeless tobacco: Never Used  Substance Use Topics  . Alcohol use: Yes    Alcohol/week: 2.0 - 3.0 standard drinks    Types: 2 - 3 Cans of beer per week    Comment: 2-3 32oz cans daily  . Drug use: Yes    Types: Marijuana    Home Medications Prior to Admission medications   Medication Sig Start Date End Date Taking? Authorizing Provider  aspirin EC 81 MG EC tablet Take 1 tablet (81 mg total) by mouth daily. 09/11/16   Barton Dubois, MD  atorvastatin (LIPITOR) 20 MG tablet Take 1 tablet (20 mg total) by mouth daily at 6 PM. 12/18/17   Dunn,  Nedra Hai, PA-C  carvedilol (COREG) 3.125 MG tablet TAKE 1 TABLET(3.125 MG) BY MOUTH TWICE DAILY 06/05/18   Burtis Junes, NP  Multiple Vitamin (MULTIVITAMIN WITH MINERALS) TABS tablet Take 1 tablet by mouth daily.    [provider]  nitroGLYCERIN (NITROSTAT) 0.4 MG SL tablet Place 1 tablet (0.4 mg total) under the tongue every 5 (five) minutes as needed for chest pain. 03/09/17   Dorothy Spark, MD  pantoprazole (PROTONIX) 40 MG tablet Take 1 tablet (40 mg total) by mouth daily. 12/18/17   Dunn, Nedra Hai, PA-C  sacubitril-valsartan (ENTRESTO) 49-51 MG Take 1 tablet by mouth 2 (two) times daily. 10/23/18   Bensimhon, Shaune Pascal, MD  spironolactone (ALDACTONE) 25 MG tablet TAKE 1 TABLET BY MOUTH ONCE DAILY 06/05/18   Charlie Pitter, PA-C    Allergies    Patient has no known allergies.  Review of Systems   Review of Systems  All other systems reviewed and are negative.   Physical Exam Updated Vital Signs BP 96/69 (BP Location: Left Arm)   Pulse 62   Temp 98.3 F (36.8 C) (Oral)   Resp (!) 8   SpO2 98%   Physical Exam Vitals and nursing note reviewed.  Constitutional:      General: He is not in acute distress.    Appearance: He is well-developed. He is not diaphoretic.  HENT:     Head: Normocephalic and atraumatic.  Cardiovascular:     Rate and Rhythm: Normal rate and regular rhythm.     Heart sounds: No murmur. No friction rub.  Pulmonary:     Effort: Pulmonary effort is normal. No respiratory distress.     Breath sounds: Normal breath sounds. No wheezing or rales.  Abdominal:     General: Bowel sounds are normal. There is no distension.     Palpations: Abdomen is soft.     Tenderness: There is no abdominal tenderness.  Musculoskeletal:        General: No swelling or tenderness. Normal range of motion.     Cervical back: Normal range of motion and neck supple.     Right lower leg: No edema.     Left lower leg: No edema.  Skin:    General: Skin is warm and dry.    Neurological:     Mental Status: He is alert and oriented to person, place, and time.     Coordination: Coordination normal.     ED Results / Procedures / Treatments   Labs (all labs ordered are listed, but only abnormal results are displayed) Labs Reviewed  BASIC METABOLIC PANEL - Abnormal; Notable for the following components:      Result Value   Glucose, Bld 142 (*)    All other components within normal limits  CBC  TROPONIN I (HIGH  SENSITIVITY)  TROPONIN I (HIGH SENSITIVITY)    EKG EKG Interpretation  Date/Time:  Sunday December 29 2018 21:59:04 EST Ventricular Rate:  70 PR Interval:  224 QRS Duration: 92 QT Interval:  394 QTC Calculation: 425 R Axis:   78 Text Interpretation: Sinus rhythm with sinus arrhythmia with 1st degree A-V block Otherwise normal ECG When compared with ECG of 10/23/2018, No significant change was found Confirmed by Delora Fuel ([id number]) on 12/30/2018 12:40:59 AM   Radiology DG Chest 2 View  Result Date: 12/29/2018 CLINICAL DATA:  63 year old male with chest pain. EXAM: CHEST - 2 VIEW COMPARISON:  Chest radiograph dated 09/05/2016. FINDINGS: No focal consolidation, pleural effusion, or pneumothorax. Mild emphysema. The cardiac silhouette is within normal limits. No acute osseous pathology. IMPRESSION: No active cardiopulmonary disease. Electronically Signed   By: Anner Crete M.D.   On: 12/29/2018 22:19    Procedures Procedures (including critical care time)  Medications Ordered in ED Medications - No data to display  ED Course  I have reviewed the triage vital signs and the nursing notes.  Pertinent labs & imaging results that were available during my care of the patient were reviewed by me and considered in my medical decision making (see chart for details).    MDM Rules/Calculators/A&P  Patient presenting with complaints of pain in his back and right abdomen.  This started yesterday and is associated with nausea and vomiting.   Patient's laboratory studies are essentially unremarkable.  His cardiac work-up is negative.  Patient vomited while in the waiting room, then began to feel better.  After a second episode of vomiting his pain returned.  Upon reexamination, he is tender in the right lower quadrant and right flank raising the concern for a renal calculus.  A renal stone study was obtained showing no evidence for kidney stone, but did show acute appendicitis with an appendicolith.  Patient will be given Rocephin and Flagyl and general surgery will be consulted.  Final Clinical Impression(s) / ED Diagnoses Final diagnoses:  None    Rx / DC Orders ED Discharge Orders    None       Veryl Speak, MD 12/30/18 [phone]

## 2018-12-30 NOTE — Plan of Care (Signed)
  Problem: Education: Goal: Required Educational Video(s) Outcome: Progressing   Problem: Clinical Measurements: Goal: Postoperative complications will be avoided or minimized Outcome: Progressing   Problem: Skin Integrity: Goal: Demonstration of wound healing without infection will improve Outcome: Progressing   

## 2018-12-30 NOTE — ED Notes (Signed)
Report given to short stay RN. Patient changed out of clothes and into only a gown. Belongings sent with patient. Patient verbalized understanding.

## 2018-12-30 NOTE — Progress Notes (Signed)
Pharmacy contacted in regards to unverified medications scheduled for 1230. Per Dwayne in pharmacy, med req has to be completed prior to verifying medications. Will continue to monitor.

## 2018-12-30 NOTE — Anesthesia Procedure Notes (Signed)
Procedure Name: Intubation Date/Time: 12/30/2018 10:15 AM Performed by: Kathryne Hitch, CRNA Pre-anesthesia Checklist: Patient identified, Emergency Drugs available, Suction available and Patient being monitored Patient Re-evaluated:Patient Re-evaluated prior to induction Oxygen Delivery Method: Circle system utilized Preoxygenation: Pre-oxygenation with 100% oxygen Induction Type: IV induction Ventilation: Mask ventilation without difficulty Laryngoscope Size: Miller and 3 Grade View: Grade I Tube type: Oral Tube size: 7.5 mm Number of attempts: 1 Airway Equipment and Method: Stylet and Oral airway Placement Confirmation: ETT inserted through vocal cords under direct vision,  positive ETCO2 and breath sounds checked- equal and bilateral Secured at: 24 cm Tube secured with: Tape Dental Injury: Teeth and Oropharynx as per pre-operative assessment

## 2018-12-30 NOTE — ED Notes (Signed)
Patient transported to CT 

## 2018-12-30 NOTE — Op Note (Signed)
Preoperative diagnosis: acute appendicitis with peritonitis  Postoperative diagnosis: Same   Procedure: laparoscopic appendectomy  Surgeon: Gurney Maxin, M.D.  Asst: none  Anesthesia: Gen.   Indications for procedure: Andres Boyd is a 63 y.o. male with symptoms of pain in right lower quadrant consistent with acute appendicitis. Confirmed by CT scan.  Description of procedure: The patient was brought into the operative suite, placed supine. Anesthesia was administered with endotracheal tube. The patient's left arm was tucked. All pressure points were offloaded by foam padding. The patient was prepped and draped in the usual sterile fashion.  A transverse incision was made to the left of the umbilicus and a 4mm trocar was [place]. Pneumoperitoneum was applied with high flow low pressure.  2 90mm trocars were placed, one in the suprapubic space, one in the LLQ, the periumbilical incision was then up-sized and a 24mm trocar placed in that space. There was a large amount of adhesive disease of the omentum to the abdominal wall. This was taken down with cautery and sharp dissection. A transversus abdominal block was placed on the left and right sides. Next, the patient was placed in trendelenberg, rotated to the left. The omentum was retracted cephalad. The cecum and appendix were identified. The appendix was firm and dilated without evidence of perforation. The base of the appendix was dissected and a window through the mesoappendix was created with blunt dissection. Large Hem-o-lock clips were used to doubly ligate the base of the appendix and mesoappendix. The appendix was cut free with scissors.  The appendix was placed in a specimen bag. The pelvis and RLQ were irrigated. The appendix was removed via the umbilicus. 0 vicryl was used to close the fascial defect. Pneumoperitoneum was removed, all trocars were removed. All incisions were closed with 4-0 monocryl subcuticular stitch. The patient woke from  anesthesia and was brought to PACU in stable condition.  Findings: adhesive disease of the omentum to the abdominal wall. Dilated appendix  Specimen: appendix  Blood loss: 50 ml  Local anesthesia: 27 ml marcaine  Complications: none  Gurney Maxin, M.D. General, Bariatric, & Minimally Invasive Surgery [facility]

## 2018-12-30 NOTE — Transfer of Care (Signed)
Immediate Anesthesia Transfer of Care Note  Patient: Andres Boyd  Procedure(s) Performed: APPENDECTOMY LAPAROSCOPIC (N/A Abdomen) LAPAROSCOPIC LYSIS OF ADHESIONS (N/A Abdomen)  Patient Location: PACU  Anesthesia Type:General  Level of Consciousness: drowsy and patient cooperative  Airway & Oxygen Therapy: Patient Spontanous Breathing and Patient connected to face mask oxygen  Post-op Assessment: Report given to RN and Post -op Vital signs reviewed and stable  Post vital signs: Reviewed and stable  Last Vitals:  Vitals Value Taken Time  BP 151/88 12/30/18 1122  Temp    Pulse 88 12/30/18 1123  Resp 20 12/30/18 1123  SpO2 100 % 12/30/18 1123  Vitals shown include unvalidated device data.  Last Pain:  Vitals:   12/30/18 0913  TempSrc: Oral  PainSc:          Complications: No apparent anesthesia complications

## 2018-12-30 NOTE — Anesthesia Preprocedure Evaluation (Addendum)
Anesthesia Evaluation  Patient identified by MRN, date of birth, ID band Patient awake    Reviewed: Allergy & Precautions, NPO status , Patient's Chart, lab work & pertinent test results  Airway Mallampati: II  TM Distance: >3 FB Neck ROM: Full    Dental  (+) Edentulous Upper, Edentulous Lower   Pulmonary Current Smoker,  Current smoker, 36 pack year history   Pulmonary exam normal breath sounds clear to auscultation       Cardiovascular hypertension, Pt. on medications and Pt. on home beta blockers + CAD, + Peripheral Vascular Disease and +CHF  Normal cardiovascular exam+ dysrhythmias  Rhythm:Regular Rate:Normal  Prolonged QT, sinus brady  Last echo 10/2018:  1. Left ventricular ejection fraction, by visual estimation, is 45 to 50%. The left ventricle has mildly decreased function. There is no left ventricular hypertrophy.  2. Global right ventricle has normal systolic function.The right ventricular size is normal. No increase in right ventricular wall thickness.  3. Left atrial size was normal.  4. Right atrial size was normal.  5. The mitral valve is normal in structure. Trace mitral valve regurgitation.  6. The tricuspid valve is grossly normal. Tricuspid valve regurgitation was not visualized by color flow Doppler.  7. The aortic valve is tricuspid Aortic valve regurgitation was not visualized by color flow Doppler. Mild to moderate aortic valve sclerosis/calcification without any evidence of aortic stenosis.  8. The pulmonic valve was grossly normal. Pulmonic valve regurgitation is not visualized by color flow Doppler.  9. Normal pulmonary artery systolic pressure. 10. The atrial septum is grossly normal.  Cath 2018 as part of W/U for elevated troponins, chest pain: 1. Mild nonobstructive CAD with no changes in coronary anatomy compared with recent cath 2. Moderately severe segmental LV systolic dysfunction with normal  periapical wall motion and severe hypokinesis/akinesis of the basal LV segments, LVEF estimated at 35-40% Suspect non-coronary cause of elevated troponin. Consider acute myocarditis or atypical stress cardiomyopathy (seems less likely).   Neuro/Psych negative neurological ROS  negative psych ROS   GI/Hepatic GERD  Medicated and Controlled,(+)     substance abuse  alcohol use and marijuana use, Acute appendicitis   Endo/Other  negative endocrine ROS  Renal/GU CRFRenal diseaseCKD 3, last Cr 1.24  negative genitourinary   Musculoskeletal negative musculoskeletal ROS (+)   Abdominal Normal abdominal exam  (+)   Peds  Hematology negative hematology ROS (+)   Anesthesia Other Findings HLD  Reproductive/Obstetrics negative OB ROS                           Anesthesia Physical Anesthesia Plan  ASA: III  Anesthesia Plan: General   Post-op Pain Management:    Induction: Intravenous  PONV Risk Score and Plan: 2 and Ondansetron, Dexamethasone, Midazolam and Treatment may vary due to age or medical condition  Airway Management Planned: Oral ETT  Additional Equipment: None  Intra-op Plan:   Post-operative Plan: Extubation in OR  Informed Consent: I have reviewed the patients History and Physical, chart, labs and discussed the procedure including the risks, benefits and alternatives for the proposed anesthesia with the patient or authorized representative who has indicated his/her understanding and acceptance.     Dental advisory given  Plan Discussed with: CRNA  Anesthesia Plan Comments:         Anesthesia Quick Evaluation

## 2018-12-30 NOTE — Progress Notes (Signed)
Patient arrived to room 6N16 via stretcher from PACU. Patient is alert and oriented times 3. No complaints of pain. NAD. VSS. Patient oriented to unit and call bell. Belongings and call bell within reach. Bed is in the lowest and locked position with bed rails up times 2. Patient able to ambulate from stretcher to bed with minimal assist, tolerated well.

## 2018-12-31 DIAGNOSIS — K3580 Unspecified acute appendicitis: Secondary | ICD-10-CM | POA: Diagnosis not present

## 2018-12-31 LAB — CBC
HCT: 36.2 % — ABNORMAL LOW (ref 39.0–52.0)
Hemoglobin: 12.3 g/dL — ABNORMAL LOW (ref 13.0–17.0)
MCH: 32.1 pg (ref 26.0–34.0)
MCHC: 34 g/dL (ref 30.0–36.0)
MCV: 94.5 fL (ref 80.0–100.0)
Platelets: 240 10*3/uL (ref 150–400)
RBC: 3.83 MIL/uL — ABNORMAL LOW (ref 4.22–5.81)
RDW: 12.7 % (ref 11.5–15.5)
WBC: 9.4 10*3/uL (ref 4.0–10.5)
nRBC: 0 % (ref 0.0–0.2)

## 2018-12-31 LAB — SURGICAL PATHOLOGY

## 2018-12-31 MED ORDER — HYDROCODONE-ACETAMINOPHEN 5-325 MG PO TABS: 1.0000 | ORAL_TABLET | Freq: Four times a day (QID) | ORAL | 0 refills | Status: DC | PRN

## 2018-12-31 NOTE — Discharge Summary (Signed)
Physician Discharge Summary  Patient ID: Andres Boyd MRN: [id number] DOB/AGE: 10-Apr-1955 63 y.o.  PCP: Nuala Alpha, DO  Admit date: 12/29/2018 Discharge date: 12/31/2018  Admission Diagnoses:  appendicitis  Discharge Diagnoses:  same  Active Problems:   Acute appendicitis   Surgery:  Lap appendectomy  Discharged Condition: improved  Hospital Course:   Had lap appy by Dr. Kieth Brightly at [facility].  Kept overnight and ready for discharge on 12/29.    Consults: none  Significant Diagnostic Studies: none    Discharge Exam: Blood pressure (!) 170/93, pulse 70, temperature 98.6 F (37 C), temperature source Oral, resp. rate 18, SpO2 99 %. Incisions are bland  Disposition: Discharge disposition: 01-Home or Self Care       Discharge Instructions    Call MD for:  persistant nausea and vomiting   Complete by: As directed    Call MD for:  redness, tenderness, or signs of infection (pain, swelling, redness, odor or green/yellow discharge around incision site)   Complete by: As directed    Call MD for:  severe uncontrolled pain   Complete by: As directed    Diet - low sodium heart healthy   Complete by: As directed    Discharge instructions   Complete by: As directed    May shower when you get home   Increase activity slowly   Complete by: As directed      Allergies as of 12/31/2018   No Known Allergies     Medication List    TAKE these medications   aspirin 81 MG EC tablet Take 1 tablet (81 mg total) by mouth daily.   atorvastatin 20 MG tablet Commonly known as: LIPITOR Take 1 tablet (20 mg total) by mouth daily at 6 PM.   carvedilol 3.125 MG tablet Commonly known as: COREG TAKE 1 TABLET(3.125 MG) BY MOUTH TWICE DAILY   Entresto 49-51 MG Generic drug: sacubitril-valsartan Take 1 tablet by mouth 2 (two) times daily.   EYE VITAMINS PO Take 1 tablet by mouth.   HYDROcodone-acetaminophen 5-325 MG tablet Commonly known as: NORCO/VICODIN Take 1 tablet  by mouth every 6 (six) hours as needed for moderate pain.   multivitamin with minerals Tabs tablet Take 1 tablet by mouth daily.   nitroGLYCERIN 0.4 MG SL tablet Commonly known as: NITROSTAT Place 1 tablet (0.4 mg total) under the tongue every 5 (five) minutes as needed for chest pain.   pantoprazole 40 MG tablet Commonly known as: PROTONIX Take 1 tablet (40 mg total) by mouth daily.   spironolactone 25 MG tablet Commonly known as: ALDACTONE TAKE 1 TABLET BY MOUTH ONCE DAILY        Signed: Pedro Earls 12/31/2018, 9:35 AM

## 2018-12-31 NOTE — Plan of Care (Signed)
  Problem: Clinical Measurements: Goal: Postoperative complications will be avoided or minimized Outcome: Progressing   Problem: Skin Integrity: Goal: Demonstration of wound healing without infection will improve Outcome: Progressing   

## 2019-01-23 ENCOUNTER — Telehealth: Payer: Self-pay

## 2019-01-23 NOTE — Telephone Encounter (Signed)
Attempted to contact pt to obtain consent and verify medications, allergies, and pharmacy.

## 2019-01-28 ENCOUNTER — Telehealth: Payer: Medicare HMO | Admitting: Cardiology

## 2019-02-09 NOTE — Progress Notes (Signed)
Cardiology Office Note    Date:  02/10/2019  ID:  Andres Boyd, DOB 1955/12/14, MRN [id number] PCP:  Nuala Alpha, DO  Cardiologist:  Ena Dawley, MD   Chief Complaint: f/u CHF, 1 year follow up  History of Present Illness:  Andres Boyd is a 64 y.o. male with history of nonischemic cardiomyopathy, probable PAD by duplex 03/2017, nonobstructive CAD, alcohol abuse, sinus bradycardia, tobacco abuse, HTN, HLD, hyponatremia, CKD III, prolonged QT.   He has a history of NSTEMI in 07/2015 with peak troponin of troponin 2. He underwent cardiac cath and only had mild plaque in mildly reduced EF, felt to represent Takatsubo variant vs coronary spasm - medical management recommended. EF was 40-45%. He was [facility] to [facility] 7/18 for chest pain and elevated troponin. At that time he was drinking > 8 ETOH beverages each night. UDS was + for marijuana and opioids. Nuclear stress test was abnormal so he underwent cath showing mild nonobstructive CAD, EF was normal at 50-55% with some anterolateral hypokinesis. LVEDP was normal. He was readmitted 09/2016 with severe midsternal chest pain - initially sent home from ER for negative troponin then returned with persistent symptoms and troponin eventually peaking at 3.42. D-dimer was negative. Repeat cath 09/06/16 showed no change to mild CAD from 07/2016. Echo showed significant drop in EF since July (50-55% -> 25-30%), with cath showing EF 35-40%. cMRI with LGE uptake was suggestive of myocarditis vs infiltrative CMP. HIV, hep screen and iron studies were normal. Dr. Haroldine Laws evaluated the patient. PYP scan without evidence of TTR amyloid. 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 He had previously been unable to fill his medicines from that discharge due to cost/transportation. 2D echo since that time (03/2017) showed EF 60-65%, grade 1 DD, normal wall motion, normal RV. He also previously had LE duplex for  claudication 03/2017 showing 30-49% stenosis in R popliteal and total occlusion in posterior tibial for which Dr. Meda Coffee recommended medical therapy. Last labs 06/2017 showed Cr 1.29, K 4.7, LFTs wnl, LDL 66, digoxin level 0.4, last CBC 09/2016 Hgb 18.3.  02/10/2019 - 1 year follow, the patient has been doing well, he has been compliant with his medications except for Lipitor.  He denies any lower extremity edema, no orthopnea proximal nocturnal dyspnea.  He denies any chest pain or shortness of breath no dizziness or syncope.  He gets mild claudication on longer distances otherwise able to perform all activities of daily living.   Past Medical History:  Diagnosis Date  . 1st degree AV block   . CKD (chronic kidney disease), stage III   . ETOH abuse   . Hyperlipidemia   . Hypertension   . Hyponatremia   . Mild CAD   . NICM (nonischemic cardiomyopathy) ([place])   . PAD (peripheral artery disease) ([place])    a. LE vasc testing 03/2017 - LE duplex for claudication 03/2017 showing 30-49% stenosis in R popliteal and total occlusion in posterior tibial. Managed medically.  . Prolonged QT interval   . Sinus bradycardia   . Tobacco abuse     Past Surgical History:  Procedure Laterality Date  . CARDIAC CATHETERIZATION N/A 07/19/2015   Procedure: Left Heart Cath and Coronary Angiography;  Surgeon: Jettie Booze, MD;  Location: [place] [facility];  Service: Cardiovascular;  Laterality: N/A;  . LAPAROSCOPIC APPENDECTOMY N/A 12/30/2018   Procedure: APPENDECTOMY LAPAROSCOPIC;  Surgeon: Kieth Brightly Arta Bruce, MD;  Location: [facility];  Service:  General;  Laterality: N/A;  . LAPAROSCOPIC LYSIS OF ADHESIONS N/A 12/30/2018   Procedure: LAPAROSCOPIC LYSIS OF ADHESIONS;  Surgeon: Kieth Brightly, Arta Bruce, MD;  Location: [facility];  Service: General;  Laterality: N/A;  . LEFT HEART CATH AND CORONARY ANGIOGRAPHY N/A 07/04/2016   Procedure: Left Heart Cath and Coronary Angiography;  Surgeon: Jettie Booze, MD;  Location:  [place] [facility];  Service: Cardiovascular;  Laterality: N/A;  . LEFT HEART CATH AND CORONARY ANGIOGRAPHY N/A 09/06/2016   Procedure: LEFT HEART CATH AND CORONARY ANGIOGRAPHY;  Surgeon: Sherren Mocha, MD;  Location: [place] [facility];  Service: Cardiovascular;  Laterality: N/A;    Current Medications: Current Meds  Medication Sig  . aspirin EC 81 MG EC tablet Take 1 tablet (81 mg total) by mouth daily.  Marland Kitchen atorvastatin (LIPITOR) 20 MG tablet Take 1 tablet (20 mg total) by mouth daily at 6 PM.  . carvedilol (COREG) 3.125 MG tablet TAKE 1 TABLET(3.125 MG) BY MOUTH TWICE DAILY  . Multiple Vitamin (MULTIVITAMIN WITH MINERALS) TABS tablet Take 1 tablet by mouth daily.  . Multiple Vitamins-Minerals (EYE VITAMINS PO) Take 1 tablet by mouth.  . nitroGLYCERIN (NITROSTAT) 0.4 MG SL tablet Place 1 tablet (0.4 mg total) under the tongue every 5 (five) minutes as needed for chest pain.  . pantoprazole (PROTONIX) 40 MG tablet Take 1 tablet (40 mg total) by mouth daily.  . sacubitril-valsartan (ENTRESTO) 49-51 MG Take 1 tablet by mouth 2 (two) times daily.  Marland Kitchen spironolactone (ALDACTONE) 25 MG tablet TAKE 1 TABLET BY MOUTH ONCE DAILY  . [DISCONTINUED] atorvastatin (LIPITOR) 20 MG tablet Take 1 tablet (20 mg total) by mouth daily at 6 PM.    Allergies:   Patient has no known allergies.   Social History   Socioeconomic History  . Marital status: Single    Spouse name: Not on file  . Number of children: Not on file  . Years of education: Not on file  . Highest education level: Not on file  Occupational History  . Not on file  Tobacco Use  . Smoking status: Current Every Day Smoker    Packs/day: 1.00    Years: 36.00    Pack years: 36.00    Types: Cigarettes  . Smokeless tobacco: Never Used  Substance and Sexual Activity  . Alcohol use: Yes    Alcohol/week: 2.0 - 3.0 standard drinks    Types: 2 - 3 Cans of beer per week    Comment: 2-3 32oz cans daily  . Drug use: Yes    Types: Marijuana  .  Sexual activity: Not on file  Other Topics Concern  . Not on file  Social History Narrative   Patient unemployed. Lives in [place] with son.    Social Determinants of Health   Financial Resource Strain:   . Difficulty of Paying Living Expenses: Not on file  Food Insecurity:   . Worried About Charity fundraiser in the Last Year: Not on file  . Ran Out of Food in the Last Year: Not on file  Transportation Needs:   . Lack of Transportation (Medical): Not on file  . Lack of Transportation (Non-Medical): Not on file  Physical Activity:   . Days of Exercise per Week: Not on file  . Minutes of Exercise per Session: Not on file  Stress:   . Feeling of Stress : Not on file  Social Connections:   . Frequency of Communication with Friends and Family: Not on file  .  Frequency of Social Gatherings with Friends and Family: Not on file  . Attends Religious Services: Not on file  . Active Member of Clubs or Organizations: Not on file  . Attends Archivist Meetings: Not on file  . Marital Status: Not on file    Family History:  The patient's family history includes Diabetes in his father, maternal grandfather, maternal grandmother, and mother; Hypertension in his maternal grandfather, maternal grandmother, and mother.  ROS:   Please see the history of present illness. He does report a "smokers cough" - this is not with recumbency or exertion. We discussed seeing PCP for this given tobacco history. All other systems are reviewed and otherwise negative.   PHYSICAL EXAM:   VS:  BP 120/72   Pulse 94   Ht 5\' 11"  (1.803 m)   Wt 200 lb 3.2 oz (90.8 kg)   SpO2 98%   BMI 27.92 kg/m   BMI: Body mass index is 27.92 kg/m. GEN: Well nourished, well developed AAM in no acute distress HEENT: normocephalic, atraumatic Neck: no JVD, carotid bruits, or masses Cardiac: RRR; no murmurs, rubs, or gallops, no edema. Diminished pedal pulses bilaterally. Respiratory:  clear to auscultation  bilaterally, normal work of breathing GI: soft, nontender, nondistended, + BS MS: no deformity or atrophy Skin: warm and dry, no rash Neuro:  Alert and Oriented x 3, Strength and sensation are intact, follows commands Psych: euthymic mood, full affect  Wt Readings from Last 3 Encounters:  02/10/19 200 lb 3.2 oz (90.8 kg)  11/12/18 196 lb 3.2 oz (89 kg)  10/23/18 197 lb (89.4 kg)    Studies/Labs Reviewed:   EKG:  EKG was ordered today and personally reviewed by me and demonstrates sinus bradycardia 1st degree AV block, no acute St-T changes, QTc 389ms  Recent Labs: 12/29/2018: BUN 14; Creatinine, Ser 1.24; Potassium 4.0; Sodium 136 12/31/2018: Hemoglobin 12.3; Platelets 240   Lipid Panel    Component Value Date/Time   CHOL 136 12/18/2017 0940   TRIG 113 12/18/2017 0940   HDL 45 12/18/2017 0940   CHOLHDL 3.0 12/18/2017 0940   CHOLHDL 2.4 07/03/2016 0402   VLDL 17 07/03/2016 0402   LDLCALC 68 12/18/2017 0940    Additional studies/ records that were reviewed today include: Summarized above.   ASSESSMENT & PLAN:   1. NICM/chronic systolic CHF - he appears euvolemic.  On most recent echocardiogram in October 2020 LVEF down to 45 to 50%, he is asymptomatic, will continue the same management. 2. mild CAD - no recent anginal sx. Continue ASA, BB, restart atorvastatin.  He had normal labs of these and lipids in December 2019. 3. PAD - no symptoms of claudication. Continue medical therapy with statin. Could consider statin titration if LDL is >70. Tobacco cessation advised. We discussed warning signs of ischemia. 4. Hypertension -well controlled.  Disposition: F/u with Dr. Meda Coffee in 6 months.  We will obtain labs including CBC, CMP, BNP TSH and lipids prior to that appointment.  Medication Adjustments/Labs and Tests Ordered: Current medicines are reviewed at length with the patient today.  Concerns regarding medicines are outlined above. Medication changes, Labs and Tests ordered  today are summarized above and listed in the Patient Instructions accessible in Encounters.   Signed, Ena Dawley, MD  02/10/2019 1:09 PM    [facility] [address], [place], [place]  [address] Phone: [phone]; Fax: [phone]

## 2019-02-10 ENCOUNTER — Other Ambulatory Visit: Payer: Self-pay

## 2019-02-10 ENCOUNTER — Encounter: Payer: Self-pay | Admitting: Cardiology

## 2019-02-10 ENCOUNTER — Ambulatory Visit (INDEPENDENT_AMBULATORY_CARE_PROVIDER_SITE_OTHER): Payer: Medicare HMO | Admitting: Cardiology

## 2019-02-10 VITALS — BP 120/72 | HR 94 | Ht 71.0 in | Wt 200.2 lb

## 2019-02-10 DIAGNOSIS — I2510 Atherosclerotic heart disease of native coronary artery without angina pectoris: Secondary | ICD-10-CM | POA: Diagnosis not present

## 2019-02-10 DIAGNOSIS — E782 Mixed hyperlipidemia: Secondary | ICD-10-CM | POA: Diagnosis not present

## 2019-02-10 DIAGNOSIS — I5022 Chronic systolic (congestive) heart failure: Secondary | ICD-10-CM

## 2019-02-10 DIAGNOSIS — I214 Non-ST elevation (NSTEMI) myocardial infarction: Secondary | ICD-10-CM | POA: Diagnosis not present

## 2019-02-10 MED ORDER — ATORVASTATIN CALCIUM 20 MG PO TABS: 20.0000 mg | ORAL_TABLET | Freq: Every day | ORAL | 3 refills | Status: DC

## 2019-02-10 NOTE — Patient Instructions (Signed)
Medication Instructions:   RESTART BACK TAKING YOUR ATORVASTATIN 20 MG BY MOUTH DAILY  *If you need a refill on your cardiac medications before your next appointment, please call your pharmacy*   Lab Work:  ON  August 01, 2019 HERE AT OUR OFFICE--PREFERABLY IN THE MORNING TIME-, PRIOR TO YOUR 6 MONTH FOLLOW-UP APPOINTMENT WITH DR. Andree Coss WILL CHECK CMET, CBC, TSH, PRO-BNP, AND LIPIDS--PLEASE COME FASTING TO THIS LAB APPOINTMENT  If you have labs (blood work) drawn today and your tests are completely normal, you will receive your results only by: Marland Kitchen MyChart Message (if you have MyChart) OR . A paper copy in the mail If you have any lab test that is abnormal or we need to change your treatment, we will call you to review the results.    Follow-Up: At [facility], you and your health needs are our priority.  As part of our continuing mission to provide you with exceptional heart care, we have created designated Provider Care Teams.  These Care Teams include your primary Cardiologist (physician) and Advanced Practice Providers (APPs -  Physician Assistants and Nurse Practitioners) who all work together to provide you with the care you need, when you need it.  Your next appointment:   6 month(s)  The format for your next appointment:   In Person  Provider:   Ena Dawley, MD

## 2019-02-18 ENCOUNTER — Other Ambulatory Visit: Payer: Self-pay | Admitting: Cardiology

## 2019-02-18 MED ORDER — ENTRESTO 49-51 MG PO TABS: 1.0000 | ORAL_TABLET | Freq: Two times a day (BID) | ORAL | 3 refills | Status: DC

## 2019-03-02 ENCOUNTER — Other Ambulatory Visit: Payer: Self-pay | Admitting: Physician Assistant

## 2019-05-31 ENCOUNTER — Other Ambulatory Visit: Payer: Self-pay | Admitting: Physician Assistant

## 2019-08-01 ENCOUNTER — Other Ambulatory Visit: Payer: Medicare HMO

## 2019-08-13 ENCOUNTER — Other Ambulatory Visit: Payer: Self-pay

## 2019-08-13 ENCOUNTER — Other Ambulatory Visit: Payer: Medicare Other | Admitting: *Deleted

## 2019-08-13 DIAGNOSIS — E782 Mixed hyperlipidemia: Secondary | ICD-10-CM

## 2019-08-13 DIAGNOSIS — I5022 Chronic systolic (congestive) heart failure: Secondary | ICD-10-CM

## 2019-08-13 DIAGNOSIS — I2510 Atherosclerotic heart disease of native coronary artery without angina pectoris: Secondary | ICD-10-CM | POA: Diagnosis not present

## 2019-08-13 DIAGNOSIS — I214 Non-ST elevation (NSTEMI) myocardial infarction: Secondary | ICD-10-CM

## 2019-08-14 LAB — COMPREHENSIVE METABOLIC PANEL
ALT: 26 IU/L (ref 0–44)
AST: 29 IU/L (ref 0–40)
Albumin/Globulin Ratio: 1.3 (ref 1.2–2.2)
Albumin: 4.6 g/dL (ref 3.8–4.8)
Alkaline Phosphatase: 59 IU/L (ref 48–121)
BUN/Creatinine Ratio: 13 (ref 10–24)
BUN: 18 mg/dL (ref 8–27)
Bilirubin Total: 0.4 mg/dL (ref 0.0–1.2)
CO2: 24 mmol/L (ref 20–29)
Calcium: 9.6 mg/dL (ref 8.6–10.2)
Chloride: 100 mmol/L (ref 96–106)
Creatinine, Ser: 1.34 mg/dL — ABNORMAL HIGH (ref 0.76–1.27)
GFR calc Af Amer: 64 mL/min/{1.73_m2} (ref >59)
GFR calc non Af Amer: 56 mL/min/{1.73_m2} — ABNORMAL LOW (ref >59)
Globulin, Total: 3.5 g/dL (ref 1.5–4.5)
Glucose: 108 mg/dL — ABNORMAL HIGH (ref 65–99)
Potassium: 4.7 mmol/L (ref 3.5–5.2)
Sodium: 138 mmol/L (ref 134–144)
Total Protein: 8.1 g/dL (ref 6.0–8.5)

## 2019-08-14 LAB — LIPID PANEL
Chol/HDL Ratio: 2.9 ratio (ref 0.0–5.0)
Cholesterol, Total: 131 mg/dL (ref 100–199)
HDL: 45 mg/dL (ref >39)
LDL Chol Calc (NIH): 64 mg/dL (ref 0–99)
Triglycerides: 126 mg/dL (ref 0–149)
VLDL Cholesterol Cal: 22 mg/dL (ref 5–40)

## 2019-08-14 LAB — CBC
Hematocrit: 41.1 % (ref 37.5–51.0)
Hemoglobin: 13.9 g/dL (ref 13.0–17.7)
MCH: 32.4 pg (ref 26.6–33.0)
MCHC: 33.8 g/dL (ref 31.5–35.7)
MCV: 96 fL (ref 79–97)
Platelets: 262 10*3/uL (ref 150–450)
RBC: 4.29 x10E6/uL (ref 4.14–5.80)
RDW: 13.8 % (ref 11.6–15.4)
WBC: 4.5 10*3/uL (ref 3.4–10.8)

## 2019-08-14 LAB — PRO B NATRIURETIC PEPTIDE: NT-Pro BNP: 18 pg/mL (ref 0–210)

## 2019-08-14 LAB — TSH: TSH: 1.37 u[IU]/mL (ref 0.450–4.500)

## 2019-08-20 ENCOUNTER — Telehealth: Payer: Self-pay | Admitting: *Deleted

## 2019-08-20 DIAGNOSIS — R7989 Other specified abnormal findings of blood chemistry: Secondary | ICD-10-CM

## 2019-08-20 DIAGNOSIS — I2510 Atherosclerotic heart disease of native coronary artery without angina pectoris: Secondary | ICD-10-CM

## 2019-08-20 DIAGNOSIS — I214 Non-ST elevation (NSTEMI) myocardial infarction: Secondary | ICD-10-CM

## 2019-08-20 DIAGNOSIS — Z79899 Other long term (current) drug therapy: Secondary | ICD-10-CM

## 2019-08-20 DIAGNOSIS — E782 Mixed hyperlipidemia: Secondary | ICD-10-CM

## 2019-08-20 MED ORDER — SPIRONOLACTONE 25 MG PO TABS: 12.5000 mg | ORAL_TABLET | Freq: Every day | ORAL | 1 refills | Status: DC

## 2019-08-20 NOTE — Telephone Encounter (Signed)
-----   Message from Sueanne Margarita, MD sent at 08/15/2019  4:57 PM EDT ----- All labs normal except SCr has increased to 1.34 - decrease Arlyce Harman to 12.5mg  daily and repeat BMET in 1 week

## 2019-08-20 NOTE — Telephone Encounter (Signed)
Spoke with the pt and informed him that per Dr. Radford Pax, covering for Dr. Meda Coffee, all his labs were normal but his creatinine was elevated.  Informed the pt that she recommends that we decrease his spironolactone to 12.5 mg po daily and recheck a bmet in one week. Confirmed the pharmacy of choice with the pt.  Scheduled the pt for repeat labs to recheck a BMET in one week for 08/28/19.  Pt verbalized understanding and agrees with this plan.

## 2019-08-28 ENCOUNTER — Other Ambulatory Visit: Payer: Self-pay

## 2019-08-28 ENCOUNTER — Other Ambulatory Visit: Payer: Medicare Other | Admitting: *Deleted

## 2019-08-28 DIAGNOSIS — I214 Non-ST elevation (NSTEMI) myocardial infarction: Secondary | ICD-10-CM

## 2019-08-28 DIAGNOSIS — E782 Mixed hyperlipidemia: Secondary | ICD-10-CM | POA: Diagnosis not present

## 2019-08-28 DIAGNOSIS — Z79899 Other long term (current) drug therapy: Secondary | ICD-10-CM | POA: Diagnosis not present

## 2019-08-28 DIAGNOSIS — R7989 Other specified abnormal findings of blood chemistry: Secondary | ICD-10-CM

## 2019-08-28 DIAGNOSIS — I2510 Atherosclerotic heart disease of native coronary artery without angina pectoris: Secondary | ICD-10-CM | POA: Diagnosis not present

## 2019-08-28 LAB — BASIC METABOLIC PANEL
BUN/Creatinine Ratio: 16 (ref 10–24)
BUN: 21 mg/dL (ref 8–27)
CO2: 21 mmol/L (ref 20–29)
Calcium: 9.5 mg/dL (ref 8.6–10.2)
Chloride: 103 mmol/L (ref 96–106)
Creatinine, Ser: 1.31 mg/dL — ABNORMAL HIGH (ref 0.76–1.27)
GFR calc Af Amer: 66 mL/min/{1.73_m2} (ref >59)
GFR calc non Af Amer: 57 mL/min/{1.73_m2} — ABNORMAL LOW (ref >59)
Glucose: 96 mg/dL (ref 65–99)
Potassium: 4.9 mmol/L (ref 3.5–5.2)
Sodium: 136 mmol/L (ref 134–144)

## 2019-08-29 ENCOUNTER — Other Ambulatory Visit: Payer: Self-pay | Admitting: Nurse Practitioner

## 2019-12-17 ENCOUNTER — Other Ambulatory Visit: Payer: Self-pay

## 2019-12-17 ENCOUNTER — Encounter: Payer: Self-pay | Admitting: Family Medicine

## 2019-12-17 ENCOUNTER — Ambulatory Visit (INDEPENDENT_AMBULATORY_CARE_PROVIDER_SITE_OTHER): Payer: Medicare Other | Admitting: Family Medicine

## 2019-12-17 VITALS — BP 80/60 | HR 73 | Ht 71.0 in | Wt 205.5 lb

## 2019-12-17 DIAGNOSIS — I952 Hypotension due to drugs: Secondary | ICD-10-CM

## 2019-12-17 DIAGNOSIS — Z0000 Encounter for general adult medical examination without abnormal findings: Secondary | ICD-10-CM | POA: Diagnosis not present

## 2019-12-17 DIAGNOSIS — Z1211 Encounter for screening for malignant neoplasm of colon: Secondary | ICD-10-CM | POA: Diagnosis not present

## 2019-12-17 DIAGNOSIS — I2510 Atherosclerotic heart disease of native coronary artery without angina pectoris: Secondary | ICD-10-CM

## 2019-12-17 DIAGNOSIS — F17200 Nicotine dependence, unspecified, uncomplicated: Secondary | ICD-10-CM | POA: Diagnosis not present

## 2019-12-17 LAB — POCT GLYCOSYLATED HEMOGLOBIN (HGB A1C): Hemoglobin A1C: 6.1 % — AB (ref 4.0–5.6)

## 2019-12-17 MED ORDER — BUPROPION HCL ER (SR) 150 MG PO TB12: ORAL_TABLET | ORAL | 2 refills | Status: DC

## 2019-12-17 NOTE — Progress Notes (Signed)
    SUBJECTIVE:   CHIEF COMPLAINT / HPI:   Patient here for a physical.  He recently got Medicare and would like to get some routine screening test.  Patient has not seen a primary care provider in a few years.  He does have a cardiologist that he sees semiregularly and who prescribed some blood pressure medications.  He wants to discuss checking for prostate cancer.  He has no family history of prostate cancer.  Sometimes he does symptoms of incomplete emptying when urinating.  No hematuria.  Patient would like to get a colonoscopy scheduled.  Has never had one.  He has no family history of colon cancer.  Patient is a current every day smoker.  He has tried using nicotine replacement therapy for but has not been able to quit.  He has never tried Chantix.  He has no history of seizures.  Patient drinks 2 to 3 cans of 25 ounce beers every few days.  He does not think his drinking is a problem at this time.  Has no desire to quit.Marland Kitchen    PERTINENT  PMH / PSH: Current smoker, current alcohol use  OBJECTIVE:   BP (!) 80/60 Comment: Lt arm provider informed  provider informed  Pulse 73   Ht 5\' 11"  (1.803 m)   Wt 205 lb 8 oz (93.2 kg)   SpO2 97%   BMI 28.66 kg/m   General: Alert and oriented.  No acute distress HEENT: moist oral mucosa.  PERRLA. CV: Regular rate and rhythm, no murmurs Pulmonary: Lungs clear to auscultation bilaterally. GI: Soft, nontender to palpation. Extremities: No pedal edema. MSK: 5/5 strength upper and lower extremities bilaterally  ASSESSMENT/PLAN:   Health maintenance examination Patient now with Medicare after not having insurance for several years.  We will do the following: -Referral to GI for colonoscopy -Schedule low-dose CT lung cancer screening -BMP, A1c -PSA.  Tobacco use disorder Patient desires to quit.  Has tried nicotine replacement in the past. -We will try bupropion today as well as nicotine replacement -Follow-up at next  visit  Hypotension Patient's blood pressure low but asymptomatic.  Patient currently on several medications for his heart disease prescribed his cardiologist that are likely the cause of his low blood pressure.  Advised patient to follow-up with his cardiologist regarding medication dose changes.     Benay Pike, MD [place]

## 2019-12-17 NOTE — Patient Instructions (Signed)
It was nice to meet you today,  We did the following things today: -I have sent in a referral to gastroenterology -We have scheduled you a CT scan to screen for lung cancer because you are a smoker -We have added a medication called bupropion to help with smoking cessation.  You take this medicine once a day for 3 days and then take it twice a day for the next 12 weeks. -Rechecked some kidney function labs and your PSA.  I will let you know the results when I get them -Please reach out to your cardiologist to see if they would like to schedule another appointment with you regarding adjustments of your medication.  I would like to see you back in 6 weeks  Have a great day,  Clemetine Marker, MD

## 2019-12-18 LAB — PSA TOTAL (REFLEX TO FREE): Prostate Specific Ag, Serum: 38.9 ng/mL — ABNORMAL HIGH (ref 0.0–4.0)

## 2019-12-18 LAB — BASIC METABOLIC PANEL
BUN/Creatinine Ratio: 16 (ref 10–24)
BUN: 19 mg/dL (ref 8–27)
CO2: 22 mmol/L (ref 20–29)
Calcium: 9.9 mg/dL (ref 8.6–10.2)
Chloride: 101 mmol/L (ref 96–106)
Creatinine, Ser: 1.17 mg/dL (ref 0.76–1.27)
GFR calc Af Amer: 75 mL/min/{1.73_m2} (ref >59)
GFR calc non Af Amer: 65 mL/min/{1.73_m2} (ref >59)
Glucose: 91 mg/dL (ref 65–99)
Potassium: 4.7 mmol/L (ref 3.5–5.2)
Sodium: 139 mmol/L (ref 134–144)

## 2019-12-19 DIAGNOSIS — Z0000 Encounter for general adult medical examination without abnormal findings: Secondary | ICD-10-CM | POA: Insufficient documentation

## 2019-12-19 NOTE — Assessment & Plan Note (Signed)
Patient now with Medicare after not having insurance for several years.  We will do the following: -Referral to GI for colonoscopy -Schedule low-dose CT lung cancer screening -BMP, A1c -PSA.

## 2019-12-19 NOTE — Assessment & Plan Note (Addendum)
Patient's blood pressure low but asymptomatic.  Patient currently on several medications for his heart disease prescribed his cardiologist that are likely the cause of his low blood pressure.  Advised patient to follow-up with his cardiologist regarding medication dose changes.

## 2019-12-19 NOTE — Assessment & Plan Note (Signed)
Patient desires to quit.  Has tried nicotine replacement in the past. -We will try bupropion today as well as nicotine replacement -Follow-up at next visit

## 2019-12-23 ENCOUNTER — Other Ambulatory Visit: Payer: Self-pay | Admitting: Family Medicine

## 2019-12-23 DIAGNOSIS — R972 Elevated prostate specific antigen [PSA]: Secondary | ICD-10-CM

## 2019-12-25 ENCOUNTER — Telehealth: Payer: Self-pay

## 2019-12-25 NOTE — Telephone Encounter (Signed)
Spoke with pt informed of appt at [facility] on Jan 14th at 11am. 301 location. Pt understood and took down information. Salvatore Marvel, CMA

## 2020-01-16 ENCOUNTER — Ambulatory Visit
Admission: RE | Admit: 2020-01-16 | Discharge: 2020-01-16 | Disposition: A | Discharge status: Home / Self Care | Payer: Medicare Other | Source: Ambulatory Visit | Attending: Family Medicine | Admitting: Family Medicine

## 2020-01-16 DIAGNOSIS — F17200 Nicotine dependence, unspecified, uncomplicated: Secondary | ICD-10-CM

## 2020-01-19 ENCOUNTER — Other Ambulatory Visit: Payer: Self-pay | Admitting: Family Medicine

## 2020-01-19 DIAGNOSIS — R918 Other nonspecific abnormal finding of lung field: Secondary | ICD-10-CM

## 2020-02-10 ENCOUNTER — Other Ambulatory Visit: Payer: Self-pay | Admitting: Cardiology

## 2020-02-10 DIAGNOSIS — I2510 Atherosclerotic heart disease of native coronary artery without angina pectoris: Secondary | ICD-10-CM

## 2020-02-10 DIAGNOSIS — E782 Mixed hyperlipidemia: Secondary | ICD-10-CM

## 2020-02-10 DIAGNOSIS — I214 Non-ST elevation (NSTEMI) myocardial infarction: Secondary | ICD-10-CM

## 2020-02-10 DIAGNOSIS — I5022 Chronic systolic (congestive) heart failure: Secondary | ICD-10-CM

## 2020-02-11 ENCOUNTER — Other Ambulatory Visit: Payer: Self-pay | Admitting: Cardiology

## 2020-02-11 DIAGNOSIS — I214 Non-ST elevation (NSTEMI) myocardial infarction: Secondary | ICD-10-CM

## 2020-02-11 DIAGNOSIS — E782 Mixed hyperlipidemia: Secondary | ICD-10-CM

## 2020-02-11 DIAGNOSIS — I2510 Atherosclerotic heart disease of native coronary artery without angina pectoris: Secondary | ICD-10-CM

## 2020-02-11 DIAGNOSIS — I5022 Chronic systolic (congestive) heart failure: Secondary | ICD-10-CM

## 2020-03-03 ENCOUNTER — Other Ambulatory Visit: Payer: Self-pay

## 2020-03-03 ENCOUNTER — Ambulatory Visit (AMBULATORY_SURGERY_CENTER): Payer: Medicare Other

## 2020-03-03 VITALS — Ht 71.0 in | Wt 205.0 lb

## 2020-03-03 DIAGNOSIS — Z1211 Encounter for screening for malignant neoplasm of colon: Secondary | ICD-10-CM

## 2020-03-03 MED ORDER — NA SULFATE-K SULFATE-MG SULF 17.5-3.13-1.6 GM/177ML PO SOLN: 1.0000 | Freq: Once | ORAL | 0 refills | Status: AC

## 2020-03-03 NOTE — Progress Notes (Signed)
Patient's pre-visit was done today over the phone with the patient due to COVID-19 pandemic.   Name,DOB and address verified. Insurance verified. Patient denies any allergies to Eggs and Soy.  Patient denies any problems with anesthesia/sedation. Patient denies taking diet pills or blood thinners.   Packet of Prep instructions mailed to patient including a copy of a consent form and pre-procedure patient acknowledgement form (with envelope to mail back to us)-pt is aware. Patient understands to call us back with any questions or concerns. The patient is COVID-19 fully vaccinated, per patient. Patient is aware of our care-partner policy and [facility]N-05 safety protocol.

## 2020-03-11 ENCOUNTER — Other Ambulatory Visit: Payer: Self-pay | Admitting: Cardiology

## 2020-03-11 MED ORDER — ENTRESTO 49-51 MG PO TABS: 1.0000 | ORAL_TABLET | Freq: Two times a day (BID) | ORAL | 0 refills | Status: DC

## 2020-03-12 ENCOUNTER — Other Ambulatory Visit: Payer: Self-pay | Admitting: Cardiology

## 2020-03-12 DIAGNOSIS — I5022 Chronic systolic (congestive) heart failure: Secondary | ICD-10-CM

## 2020-03-12 DIAGNOSIS — I2510 Atherosclerotic heart disease of native coronary artery without angina pectoris: Secondary | ICD-10-CM

## 2020-03-12 DIAGNOSIS — I214 Non-ST elevation (NSTEMI) myocardial infarction: Secondary | ICD-10-CM

## 2020-03-12 DIAGNOSIS — E782 Mixed hyperlipidemia: Secondary | ICD-10-CM

## 2020-03-15 ENCOUNTER — Other Ambulatory Visit: Payer: Self-pay | Admitting: Cardiology

## 2020-03-15 DIAGNOSIS — E782 Mixed hyperlipidemia: Secondary | ICD-10-CM

## 2020-03-15 DIAGNOSIS — I214 Non-ST elevation (NSTEMI) myocardial infarction: Secondary | ICD-10-CM

## 2020-03-15 DIAGNOSIS — I5022 Chronic systolic (congestive) heart failure: Secondary | ICD-10-CM

## 2020-03-15 DIAGNOSIS — Z79899 Other long term (current) drug therapy: Secondary | ICD-10-CM

## 2020-03-15 DIAGNOSIS — I2510 Atherosclerotic heart disease of native coronary artery without angina pectoris: Secondary | ICD-10-CM

## 2020-03-15 DIAGNOSIS — R7989 Other specified abnormal findings of blood chemistry: Secondary | ICD-10-CM

## 2020-03-17 ENCOUNTER — Other Ambulatory Visit: Payer: Self-pay

## 2020-03-17 ENCOUNTER — Other Ambulatory Visit: Payer: Self-pay | Admitting: Cardiology

## 2020-03-17 ENCOUNTER — Telehealth: Payer: Self-pay | Admitting: Cardiology

## 2020-03-17 ENCOUNTER — Ambulatory Visit: Payer: Medicare Other | Admitting: Gastroenterology

## 2020-03-17 ENCOUNTER — Telehealth: Payer: Self-pay

## 2020-03-17 ENCOUNTER — Encounter: Payer: Self-pay | Admitting: Gastroenterology

## 2020-03-17 VITALS — BP 129/88 | HR 59 | Temp 97.7°F | Ht 71.0 in | Wt 205.0 lb

## 2020-03-17 DIAGNOSIS — I2510 Atherosclerotic heart disease of native coronary artery without angina pectoris: Secondary | ICD-10-CM

## 2020-03-17 DIAGNOSIS — Z79899 Other long term (current) drug therapy: Secondary | ICD-10-CM

## 2020-03-17 DIAGNOSIS — R7989 Other specified abnormal findings of blood chemistry: Secondary | ICD-10-CM

## 2020-03-17 DIAGNOSIS — I214 Non-ST elevation (NSTEMI) myocardial infarction: Secondary | ICD-10-CM

## 2020-03-17 DIAGNOSIS — E782 Mixed hyperlipidemia: Secondary | ICD-10-CM

## 2020-03-17 DIAGNOSIS — Z1211 Encounter for screening for malignant neoplasm of colon: Secondary | ICD-10-CM

## 2020-03-17 MED ORDER — SODIUM CHLORIDE 0.9 % IV SOLN: 500.0000 mL | Freq: Once | INTRAVENOUS | Status: DC

## 2020-03-17 NOTE — Telephone Encounter (Signed)
I spoke with Andres Boyd at [facility], patient is scheduled to see Dr. Meda Coffee on Monday, 03/22/20 at 8 AM. Discussed with Donnetta Simpers, RN as well and there are no available appointments this week. Dr. Loletha Carrow states that the Monday appointment is fine.  Spoke with patient in regards to his cardiology appt. Advised that if he has any further episodes of chest pain prior to his appointment he will need to go to the ED for evaluation. Patient verbalized understanding and had no concerns at the end of the call.

## 2020-03-17 NOTE — Progress Notes (Signed)
This patient was evaluated in the preprocedure area at our endoscopy center prior to a screening colonoscopy.  Our CRNA obtained history from patient that last week he had an episode of substernal chest pressure radiating down to his left arm while smoking a cigarette.  It lasted several minutes and resolved.  I corroborated that history.  Andres Boyd is a somewhat limited historian, and he does not seem to get exertional chest pain but is also fairly sedentary.  He does have chronic symptoms of what sounds like claudication, does not have peripheral arterial disease.  Last cardiology office note by Dr. Meda Coffee from February 2021 was reviewed.  Patient was apparently to follow-up with them in about 6 months but has not been seen since then.  He has not had any further chest pain episodes since her last week, and he is feeling well now.  Vital signs are as recorded.  Cardiac rhythm is regular with no appreciable murmur or rub.  Lungs are clear bilaterally good air entry.  Abdomen soft nondistended non- tender.  Given these recent symptoms and the patient's known history of coronary arteryand peripheral arterial disease, today's procedure has been canceled, and the patient needs reevaluation by his cardiologist soon to determine if any further testing is warranted.  Our office staff will contact the cardiology practice to help arrange a clinic visit with them soon as possible.  Based on this patient's reported history and evaluation today, he is not in need of emergency cardiac evaluation.   - Wilfrid Lund, MD    [facility]

## 2020-03-17 NOTE — Telephone Encounter (Signed)
RN received call from [place], [place] at [facility] stating they had to cancel patients procedure today due to pain having chest pain and arm pain. She states patient is actively having chest pain and they are requesting patient to be seen. No noted open slots this afternoon, and DOD schedule is full. RN suggest patient be evaluated at the ED due to patient actively having symptoms and pain at this time. [place] to update their provider regarding recommendations and advise patient. Office tentatively scheduled appt with scheduler on 03/22/2020 with Dr. Meda Coffee.   Will route to Dr. Meda Coffee and Winifred Olive for [place].

## 2020-03-17 NOTE — Progress Notes (Signed)
Pt's states no medical or surgical changes since previsit or office visit.   VS taken by SH 

## 2020-03-17 NOTE — Telephone Encounter (Signed)
Per chart review, Dr. Loletha Carrow had previously spoken with Dr. Meda Coffee in regards to scheduling appointment for patient. Dr. Loletha Carrow did not feel that patient needed emergency evaluation. Patient is scheduled for 3/21.   RN contacted patient to confirm appointment. Patient states he was already aware. Patient is not currently having chest pain. RN reviewed ED precautions in the event that chest pain returned and worsened. Patient verbalized understanding and  thanked Therapist, sports for calling.

## 2020-03-17 NOTE — Progress Notes (Signed)
Pt experienced chest pain last week while smoking with pain in center of chest and down left arm.  Dr Loletha Carrow updated and speaking with patient.  Case canceled.

## 2020-03-17 NOTE — Telephone Encounter (Signed)
-----   Message from Dorothy Spark, MD sent at 03/17/2020 12:38 PM EDT ----- Can you arrange a cardiology appointment with the next week? Thank you ----- Message ----- From: Doran Stabler, MD Sent: 03/17/2020  11:57 AM EDT To: Dorothy Spark, MD, #  [facility],  This patient was here for screening colonoscopy today, but it was canceled because he described an episode of chest pain that occurred last week and has not recurred since.  I documented this with a note in his chart from today's visit.  This patient was last seen by his cardiologist in February 2021, and his history of coronary artery disease, MI and peripheral arterial disease.  He needs to be seen at the cardiology practice as soon as possible for further evaluation and consideration of cardiac testing.  I have copied this to both his primary care provider.  I also copied it to his cardiologist, Dr. Ena Dawley, but she is leaving the practice soon if she has not done so already.  Therefore, I would like you to contact the cardiology office today, speak with one of the nursing staff and give this information to them so this patient can be scheduled for office follow-up there soon.  - H. Danis  ___________________  Houston Siren,    Please see above regarding a patient of yours.    Dr. Sandi Carne,  FYI on clinic patient of yours.

## 2020-03-18 NOTE — Telephone Encounter (Signed)
Message Received: Vicki Mallet, Pearline Cables, RN  Dorothy Spark, MD; Doran Stabler, MD; Sandi Carne [name], DO; Nuala Alpha, LPN Patient is scheduled to see Dr. Meda Coffee on Monday, 03/22/20 at 8 AM. Thank you

## 2020-03-18 NOTE — Progress Notes (Signed)
Message Received: Andres Boyd, Andres Cables, RN  Dorothy Spark, MD; Doran Stabler, MD; Sandi Carne [name], DO; Nuala Alpha, LPN Patient is scheduled to see Dr. Meda Coffee on Monday, 03/22/20 at 8 AM. Thank you

## 2020-03-22 ENCOUNTER — Encounter: Payer: Self-pay | Admitting: Cardiology

## 2020-03-22 ENCOUNTER — Ambulatory Visit (INDEPENDENT_AMBULATORY_CARE_PROVIDER_SITE_OTHER): Payer: Medicare Other | Admitting: Cardiology

## 2020-03-22 ENCOUNTER — Other Ambulatory Visit: Payer: Self-pay

## 2020-03-22 VITALS — BP 126/78 | HR 64 | Ht 71.0 in | Wt 207.0 lb

## 2020-03-22 DIAGNOSIS — I5042 Chronic combined systolic (congestive) and diastolic (congestive) heart failure: Secondary | ICD-10-CM | POA: Diagnosis not present

## 2020-03-22 DIAGNOSIS — I2510 Atherosclerotic heart disease of native coronary artery without angina pectoris: Secondary | ICD-10-CM | POA: Diagnosis not present

## 2020-03-22 DIAGNOSIS — I10 Essential (primary) hypertension: Secondary | ICD-10-CM | POA: Diagnosis not present

## 2020-03-22 DIAGNOSIS — E782 Mixed hyperlipidemia: Secondary | ICD-10-CM

## 2020-03-22 DIAGNOSIS — I428 Other cardiomyopathies: Secondary | ICD-10-CM | POA: Diagnosis not present

## 2020-03-22 NOTE — Progress Notes (Signed)
Cardiology Office Note    Date:  03/22/2020  ID:  Andres Boyd, DOB May 12, 1955, MRN [id number] PCP:  Andres Dunker, DO  Cardiologist:  Andres Dawley, MD   Chief Complaint: 1 year follow-up for CHF  History of Present Illness:  Andres Boyd is a 65 y.o. male with history of nonischemic cardiomyopathy, probable PAD by duplex 03/2017, nonobstructive CAD, alcohol abuse, sinus bradycardia, tobacco abuse, HTN, HLD, hyponatremia, CKD III, prolonged QT.   He has a history of NSTEMI in 07/2015 with peak troponin of troponin 2. He underwent cardiac cath and only had mild plaque in mildly reduced EF, felt to represent Takatsubo variant vs coronary spasm - medical management recommended. EF was 40-45%. He was readmitted to [facility] 7/18 for chest pain and elevated troponin. At that time he was drinking > 8 ETOH beverages each night. UDS was + for marijuana and opioids. Nuclear stress test was abnormal so he underwent cath showing mild nonobstructive CAD, EF was normal at 50-55% with some anterolateral hypokinesis. LVEDP was normal. He was readmitted 09/2016 with severe midsternal chest pain - initially sent home from ER for negative troponin then returned with persistent symptoms and troponin eventually peaking at 3.42. D-dimer was negative. Repeat cath 09/06/16 showed no change to mild CAD from 07/2016. Echo showed significant drop in EF since July (50-55% -> 25-30%), with cath showing EF 35-40%. cMRI with LGE uptake was suggestive of myocarditis vs infiltrative CMP. HIV, hep screen and iron studies were normal. Dr. Haroldine Boyd evaluated the patient. PYP scan without evidence of TTR amyloid. 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 He had previously been unable to fill his medicines from that discharge due to cost/transportation. 2D echo since that time (03/2017) showed EF 60-65%, grade 1 DD, normal wall motion, normal RV.  Repeat echocardiogram in 2020  showed decrease in LVEF to 45 to 50%, the patient was started on Entresto, he is coming after a year, he has no symptoms of lower extremity edema orthopnea proximal nocturnal dyspnea.  His weight is stable from last year, he denies any chest pain palpitation dizziness or syncope.  He has been compliant with his meds.  Past Medical History:  Diagnosis Date  . 1st degree AV block   . CHF (congestive heart failure) ([place])   . CKD (chronic kidney disease), stage III ([place])   . ETOH abuse   . GERD (gastroesophageal reflux disease)   . Hyperlipidemia   . Hypertension   . Hyponatremia   . Mild CAD   . Myocardial infarction ([place])   . NICM (nonischemic cardiomyopathy) ([place])   . PAD (peripheral artery disease) ([place])    a. LE vasc testing 03/2017 - LE duplex for claudication 03/2017 showing 30-49% stenosis in R popliteal and total occlusion in posterior tibial. Managed medically.  . Prolonged QT interval   . Sinus bradycardia   . Tobacco abuse    Past Surgical History:  Procedure Laterality Date  . APPENDECTOMY    . CARDIAC CATHETERIZATION N/A 07/19/2015   Procedure: Left Heart Cath and Coronary Angiography;  Surgeon: Andres Booze, MD;  Location: [facility];  Service: Cardiovascular;  Laterality: N/A;  . LAPAROSCOPIC APPENDECTOMY N/A 12/30/2018   Procedure: APPENDECTOMY LAPAROSCOPIC;  Surgeon: Andres Brightly Arta Bruce, MD;  Location: [facility];  Service: General;  Laterality: N/A;  . LAPAROSCOPIC LYSIS OF ADHESIONS N/A 12/30/2018   Procedure: LAPAROSCOPIC LYSIS OF ADHESIONS;  Surgeon: Andres Brightly Arta Bruce, MD;  Location: [facility];  Service: General;  Laterality: N/A;  . LEFT HEART CATH AND CORONARY ANGIOGRAPHY N/A 07/04/2016   Procedure: Left Heart Cath and Coronary Angiography;  Surgeon: Andres Booze, MD;  Location: [facility];  Service: Cardiovascular;  Laterality: N/A;  . LEFT HEART CATH AND CORONARY ANGIOGRAPHY N/A 09/06/2016   Procedure: LEFT HEART CATH AND CORONARY  ANGIOGRAPHY;  Surgeon: Andres Mocha, MD;  Location: [facility];  Service: Cardiovascular;  Laterality: N/A;   Current Medications: Current Meds  Medication Sig  . atorvastatin (LIPITOR) 20 MG tablet TAKE 1 TABLET(20 MG) BY MOUTH DAILY AT 6 PM. PLEASE MAKE OVERDUE APPOINTMENT WITH DOCTOR Andres Boyd BEFORE ANYMORE REFILLS.  Marland Kitchen buPROPion (WELLBUTRIN SR) 150 MG 12 hr tablet Take one tab a day for first three days, then increase to 2 tabs a day for next 12 weeks.  . carvedilol (COREG) 3.125 MG tablet TAKE 1 TABLET(3.125 MG) BY MOUTH TWICE DAILY  . Multiple Vitamins-Minerals (CENTRUM ADULTS PO) Take by mouth. Takes most days  . nitroGLYCERIN (NITROSTAT) 0.4 MG SL tablet Place 1 tablet (0.4 mg total) under the tongue every 5 (five) minutes as needed for chest pain.  . pantoprazole (PROTONIX) 40 MG tablet TAKE 1 TABLET(40 MG) BY MOUTH DAILY  . sacubitril-valsartan (ENTRESTO) 49-51 MG Take 1 tablet by mouth 2 (two) times daily. Please make overdue appt with Dr. Cardiologist before anymore refills. Thank you 1st attempt  . spironolactone (ALDACTONE) 25 MG tablet TAKE 1 TABLET BY MOUTH EVERY DAY.(FIRST ATTEMPT: PLEASE CALL [phone] TO SCHEDULE AN AUGUST FOLLOW UP WITH DISCARD REMAINDER. [name])   Current Facility-Administered Medications for the 03/22/20 encounter (Office Visit) with Andres Spark, MD  Medication  . 0.9 %  sodium chloride infusion   Allergies:   Patient has no known allergies.   Social History   Socioeconomic History  . Marital status: Single    Spouse name: Not on file  . Number of children: Not on file  . Years of education: Not on file  . Highest education level: Not on file  Occupational History  . Not on file  Tobacco Use  . Smoking status: Current Every Day Smoker    Packs/day: 1.00    Years: 36.00    Pack years: 36.00    Types: Cigarettes  . Smokeless tobacco: Never Used  Vaping Use  . Vaping Use: Never used  Substance and Sexual Activity  . Alcohol  use: Yes    Alcohol/week: 2.0 - 3.0 standard drinks    Types: 2 - 3 Cans of beer per week    Comment: 2-3 32oz cans daily  . Drug use: Yes    Types: Marijuana    Comment: randomly   . Sexual activity: Not on file  Other Topics Concern  . Not on file  Social History Narrative   Patient unemployed. Lives in [place] with son.    Social Determinants of Health   Financial Resource Strain: Not on file  Food Insecurity: Not on file  Transportation Needs: Not on file  Physical Activity: Not on file  Stress: Not on file  Social Connections: Not on file    Family History:  The patient's family history includes Diabetes in his father, maternal grandfather, maternal grandmother, and mother; Hypertension in his maternal grandfather, maternal grandmother, and mother. There is no history of Colon cancer, Colon polyps, Esophageal cancer, Rectal cancer, or Stomach cancer.  ROS:   Please see the history of present illness. He does report a "  smokers cough" - this is not with recumbency or exertion. We discussed seeing PCP for this given tobacco history. All other systems are reviewed and otherwise negative.   PHYSICAL EXAM:   VS:  BP 126/78   Pulse 64   Ht 5\' 11"  (1.803 m)   Wt 207 lb (93.9 kg)   SpO2 96%   BMI 28.87 kg/m   BMI: Body mass index is 28.87 kg/m. GEN: Well nourished, well developed AAM in no acute distress HEENT: normocephalic, atraumatic Neck: no JVD, carotid bruits, or masses Cardiac: RRR; no murmurs, rubs, or gallops, no edema. Diminished pedal pulses bilaterally. Respiratory:  clear to auscultation bilaterally, normal work of breathing GI: soft, nontender, nondistended, + BS MS: no deformity or atrophy Skin: warm and dry, no rash Neuro:  Alert and Oriented x 3, Strength and sensation are intact, follows commands Psych: euthymic mood, full affect  Wt Readings from Last 3 Encounters:  03/22/20 207 lb (93.9 kg)  03/17/20 205 lb (93 kg)  03/03/20 205 lb (93 kg)     Studies/Labs Reviewed:   EKG:  EKG was ordered today and personally reviewed by me and demonstrates sinus bradycardia 59 bpm, first-degree AV block, this is unchanged from prior.  Recent Labs: 08/13/2019: ALT 26; Hemoglobin 13.9; NT-Pro BNP 18; Platelets 262; TSH 1.370 12/17/2019: BUN 19; Creatinine, Ser 1.17; Potassium 4.7; Sodium 139   Lipid Panel    Component Value Date/Time   CHOL 131 08/13/2019 0928   TRIG 126 08/13/2019 0928   HDL 45 08/13/2019 0928   CHOLHDL 2.9 08/13/2019 0928   CHOLHDL 2.4 07/03/2016 0402   VLDL 17 07/03/2016 0402   LDLCALC 64 08/13/2019 0928   Additional studies/ records that were reviewed today include: Summarized above.  ASSESSMENT & PLAN:   1. NICM/chronic systolic CHF - he appears euvolemic.  On most recent echocardiogram in October 2020 LVEF down to 45 to 50%, he is asymptomatic, will continue the same management with Entresto, spironolactone and carvedilol. 2. mild CAD - no recent anginal sx. Continue ASA, BB, continue aspirin 81 mg daily and atorvastatin 20 mg daily.  He had normal labs of these and lipids in December 2019. 3. PAD - no symptoms of claudication. Continue medical therapy with statin.  Tobacco cessation advised. We discussed warning signs of ischemia.  Also smoking cessation strongly advised. 4. Hypertension -well controlled.  Disposition: F/u with Dr. Johney Frame in 1 year.  Medication Adjustments/Labs and Tests Ordered: Current medicines are reviewed at length with the patient today.  Concerns regarding medicines are outlined above. Medication changes, Labs and Tests ordered today are summarized above and listed in the Patient Instructions accessible in Encounters.   Signed, Andres Dawley, MD  03/22/2020 8:40 AM    [facility] [place], [place], [place]  [address] Phone: [phone]; Fax: [phone]

## 2020-03-22 NOTE — Patient Instructions (Signed)

## 2020-03-25 ENCOUNTER — Other Ambulatory Visit: Payer: Self-pay | Admitting: Physician Assistant

## 2020-04-12 ENCOUNTER — Other Ambulatory Visit: Payer: Self-pay | Admitting: Cardiology

## 2020-04-12 DIAGNOSIS — I2510 Atherosclerotic heart disease of native coronary artery without angina pectoris: Secondary | ICD-10-CM

## 2020-04-12 DIAGNOSIS — I214 Non-ST elevation (NSTEMI) myocardial infarction: Secondary | ICD-10-CM

## 2020-04-12 DIAGNOSIS — I5022 Chronic systolic (congestive) heart failure: Secondary | ICD-10-CM

## 2020-04-12 DIAGNOSIS — E782 Mixed hyperlipidemia: Secondary | ICD-10-CM

## 2020-04-14 ENCOUNTER — Other Ambulatory Visit: Payer: Self-pay | Admitting: Cardiology

## 2020-04-14 MED ORDER — ENTRESTO 49-51 MG PO TABS: 1.0000 | ORAL_TABLET | Freq: Two times a day (BID) | ORAL | 3 refills | Status: DC

## 2020-05-02 DIAGNOSIS — R918 Other nonspecific abnormal finding of lung field: Secondary | ICD-10-CM

## 2020-05-02 HISTORY — DX: Other nonspecific abnormal finding of lung field: R91.8

## 2020-05-14 ENCOUNTER — Other Ambulatory Visit: Payer: Self-pay | Admitting: Registered Nurse

## 2020-05-14 DIAGNOSIS — Z87891 Personal history of nicotine dependence: Secondary | ICD-10-CM

## 2020-05-14 DIAGNOSIS — F17210 Nicotine dependence, cigarettes, uncomplicated: Secondary | ICD-10-CM

## 2020-05-20 ENCOUNTER — Encounter: Payer: Self-pay | Admitting: Emergency Medicine

## 2020-05-20 ENCOUNTER — Ambulatory Visit (INDEPENDENT_AMBULATORY_CARE_PROVIDER_SITE_OTHER): Payer: Medicare Other | Admitting: Emergency Medicine

## 2020-05-20 ENCOUNTER — Other Ambulatory Visit: Payer: Self-pay

## 2020-05-20 VITALS — BP 100/62 | HR 71 | Ht 71.0 in | Wt 199.0 lb

## 2020-05-20 DIAGNOSIS — R918 Other nonspecific abnormal finding of lung field: Secondary | ICD-10-CM | POA: Diagnosis not present

## 2020-05-20 DIAGNOSIS — R0602 Shortness of breath: Secondary | ICD-10-CM | POA: Diagnosis not present

## 2020-05-20 NOTE — Progress Notes (Signed)
Subjective:    Patient ID: Andres Boyd, male    DOB: 07-04-1955, 65 y.o.   MRN: [id number]  HPI 65 year old man with a history of alcohol and tobacco use (36 pack years), CAD with cardiomyopathy (suspect nonischemic), chronic kidney disease stage III, hypertension, PVD.  He is active. Never really feels limited by his breathing. He has daily cough, clear mucous. Never hemoptysis. Smoking about 3-4 cig a day. He has an albuterol inhaler - may get some benefit from it, feels that it "opens him up"  Participates in lung cancer screening program, had a low-dose CT done 01/16/2020 that I have reviewed.  This was deemed to be suspicious, lung RADS 4A with multiple bilateral pulmonary nodules, largest in the left lower lobe 10 mm with some associated spiculation.   Review of Systems As per HPI  Past Medical History:  Diagnosis Date  . 1st degree AV block   . CHF (congestive heart failure) ([place])   . CKD (chronic kidney disease), stage III ([place])   . ETOH abuse   . GERD (gastroesophageal reflux disease)   . Hyperlipidemia   . Hypertension   . Hyponatremia   . Mild CAD   . Myocardial infarction ([place])   . NICM (nonischemic cardiomyopathy) ([place])   . PAD (peripheral artery disease) ([place])    a. LE vasc testing 03/2017 - LE duplex for claudication 03/2017 showing 30-49% stenosis in R popliteal and total occlusion in posterior tibial. Managed medically.  . Prolonged QT interval   . Sinus bradycardia   . Tobacco abuse      Family History  Problem Relation Age of Onset  . Diabetes Mother   . Hypertension Mother   . Diabetes Father   . Diabetes Maternal Grandmother   . Hypertension Maternal Grandmother   . Diabetes Maternal Grandfather   . Hypertension Maternal Grandfather   . Colon cancer Neg Hx   . Colon polyps Neg Hx   . Esophageal cancer Neg Hx   . Rectal cancer Neg Hx   . Stomach cancer Neg Hx      Social History   Socioeconomic History  . Marital status: Single    Spouse  name: Not on file  . Number of children: Not on file  . Years of education: Not on file  . Highest education level: Not on file  Occupational History  . Not on file  Tobacco Use  . Smoking status: Current Every Day Smoker    Packs/day: 1.00    Years: 36.00    Pack years: 36.00    Types: Cigarettes    Start date: 01/1972  . Smokeless tobacco: Never Used  Vaping Use  . Vaping Use: Never used  Substance and Sexual Activity  . Alcohol use: Yes    Alcohol/week: 2.0 - 3.0 standard drinks    Types: 2 - 3 Cans of beer per week    Comment: 2-3 32oz cans daily  . Drug use: Yes    Types: Marijuana    Comment: randomly   . Sexual activity: Not on file  Other Topics Concern  . Not on file  Social History Narrative   Patient unemployed. Lives in [place] with son.    Social Determinants of Health   Financial Resource Strain: Not on file  Food Insecurity: Not on file  Transportation Needs: Not on file  Physical Activity: Not on file  Stress: Not on file  Social Connections: Not on file  Intimate Partner Violence: Not on file  Has lived in [place], [place] No [employer] Works as a Actor.  No inhaled exposures except dusts.  No hx TB, no known TB exposures.   No Known Allergies   Outpatient Medications Prior to Visit  Medication Sig Dispense Refill  . atorvastatin (LIPITOR) 20 MG tablet Take 1 tablet (20 mg total) by mouth daily at 6 PM. 90 tablet 3  . buPROPion (WELLBUTRIN SR) 150 MG 12 hr tablet Take one tab a day for first three days, then increase to 2 tabs a day for next 12 weeks. 60 tablet 2  . carvedilol (COREG) 3.125 MG tablet TAKE 1 TABLET(3.125 MG) BY MOUTH TWICE DAILY 180 tablet 3  . Multiple Vitamins-Minerals (CENTRUM ADULTS PO) Take by mouth. Takes most days    . nitroGLYCERIN (NITROSTAT) 0.4 MG SL tablet Place 1 tablet (0.4 mg total) under the tongue every 5 (five) minutes as needed for chest pain. 90 tablet 3  . pantoprazole (PROTONIX) 40 MG tablet TAKE 1 TABLET(40 MG) BY  MOUTH DAILY 90 tablet 3  . sacubitril-valsartan (ENTRESTO) 49-51 MG Take 1 tablet by mouth 2 (two) times daily. 180 tablet 3  . spironolactone (ALDACTONE) 25 MG tablet TAKE 1 TABLET BY MOUTH EVERY DAY.(FIRST ATTEMPT: PLEASE CALL [phone] TO SCHEDULE AN AUGUST FOLLOW UP WITH DISCARD REMAINDER. [place]) 30 tablet 0  . albuterol (VENTOLIN HFA) 108 (90 Base) MCG/ACT inhaler SMARTSIG:1 Puff(s) Via Inhaler Every 4 Hours PRN    . 0.9 %  sodium chloride infusion      No facility-administered medications prior to visit.        Objective:   Physical Exam  Vitals:   05/20/20 1033  BP: 100/62  Pulse: 71  SpO2: 98%  Weight: 199 lb (90.3 kg)  Height: 5\' 11"  (1.803 m)   Gen: Pleasant, well-nourished, in no distress,  normal affect  ENT: No lesions,  mouth clear,  oropharynx clear, no postnasal drip  Neck: No JVD, no stridor  Lungs: No use of accessory muscles, no crackles or wheezing on normal respiration, no wheeze on forced expiration  Cardiovascular: RRR, heart sounds normal, no murmur or gallops, no peripheral edema  Musculoskeletal: No deformities, no cyanosis or clubbing  Neuro: alert, awake, non focal  Skin: Warm, no lesions or rash    Assessment & Plan:  Pulmonary nodules Largest and most concerning is in his left lower lobe, 10 mm with some associated spiculation.  Least moderate suspicion for malignancy given his tobacco history.  That scan was done in January and we will plan to repeat a super D CT now.  If suspicion remains high then we will discuss possible navigational bronchoscopy versus PET scan and referral for primary resection.  We will perform pulmonary function testing to help assess his suitability for resection if we believe is indicated.  We will repeat your CT scan of the chest We will perform full pulmonary function testing Keep your albuterol inhaler available to use 2 puffs when you need it for shortness of breath, chest tightness, wheezing.  Depending on  your pulmonary function testing we may decide to add another inhaler for you to use every day. Follow Dr. Lamonte Sakai next available after these test so that we can review the results together and plan next steps.  Shortness of breath Mild.  Remains active.  With history of tobacco use as well as cardiac disease.  We will perform pulmonary function testing to assess his degree of obstruction, consider scheduled BD therapy if indicated.  Baltazar Apo, MD, PhD  05/20/2020, 12:30 PM [facility] [phone] or if no answer before 7:00PM call [phone] For any issues after 7:00PM please call eLink [phone]

## 2020-05-20 NOTE — Assessment & Plan Note (Signed)
Largest and most concerning is in his left lower lobe, 10 mm with some associated spiculation.  Least moderate suspicion for malignancy given his tobacco history.  That scan was done in January and we will plan to repeat a super D CT now.  If suspicion remains high then we will discuss possible navigational bronchoscopy versus PET scan and referral for primary resection.  We will perform pulmonary function testing to help assess his suitability for resection if we believe is indicated.  We will repeat your CT scan of the chest We will perform full pulmonary function testing Keep your albuterol inhaler available to use 2 puffs when you need it for shortness of breath, chest tightness, wheezing.  Depending on your pulmonary function testing we may decide to add another inhaler for you to use every day. Follow Dr. Lamonte Sakai next available after these test so that we can review the results together and plan next steps.

## 2020-05-20 NOTE — Patient Instructions (Signed)
We will repeat your CT scan of the chest We will perform full pulmonary function testing Keep your albuterol inhaler available to use 2 puffs when you need it for shortness of breath, chest tightness, wheezing.  Depending on your pulmonary function testing we may decide to add another inhaler for you to use every day. Follow Dr. Lamonte Sakai next available after these test so that we can review the results together and plan next steps.

## 2020-05-20 NOTE — Assessment & Plan Note (Addendum)
Mild.  Remains active.  With history of tobacco use as well as cardiac disease.  We will perform pulmonary function testing to assess his degree of obstruction, consider scheduled BD therapy if indicated.

## 2020-05-27 ENCOUNTER — Other Ambulatory Visit: Payer: Self-pay

## 2020-05-27 ENCOUNTER — Ambulatory Visit (HOSPITAL_COMMUNITY)
Admission: RE | Admit: 2020-05-27 | Discharge: 2020-05-27 | Disposition: A | Discharge status: Home / Self Care | Payer: Medicare Other | Source: Ambulatory Visit | Attending: Emergency Medicine | Admitting: Emergency Medicine

## 2020-05-27 DIAGNOSIS — R918 Other nonspecific abnormal finding of lung field: Secondary | ICD-10-CM | POA: Diagnosis present

## 2020-05-28 ENCOUNTER — Ambulatory Visit: Payer: Medicare Other

## 2020-06-01 ENCOUNTER — Other Ambulatory Visit: Payer: Self-pay

## 2020-06-01 DIAGNOSIS — E782 Mixed hyperlipidemia: Secondary | ICD-10-CM

## 2020-06-01 DIAGNOSIS — I2510 Atherosclerotic heart disease of native coronary artery without angina pectoris: Secondary | ICD-10-CM

## 2020-06-01 DIAGNOSIS — I214 Non-ST elevation (NSTEMI) myocardial infarction: Secondary | ICD-10-CM

## 2020-06-01 DIAGNOSIS — R7989 Other specified abnormal findings of blood chemistry: Secondary | ICD-10-CM

## 2020-06-01 DIAGNOSIS — Z79899 Other long term (current) drug therapy: Secondary | ICD-10-CM

## 2020-06-01 MED ORDER — SPIRONOLACTONE 25 MG PO TABS: ORAL_TABLET | ORAL | 3 refills | Status: DC

## 2020-06-03 ENCOUNTER — Ambulatory Visit: Payer: Medicare Other | Admitting: Emergency Medicine

## 2020-06-11 ENCOUNTER — Ambulatory Visit: Payer: Medicare Other | Admitting: Emergency Medicine

## 2020-07-22 ENCOUNTER — Ambulatory Visit: Payer: Medicare Other | Admitting: Emergency Medicine

## 2020-08-06 ENCOUNTER — Other Ambulatory Visit: Payer: Self-pay | Admitting: Urology

## 2020-08-06 DIAGNOSIS — C61 Malignant neoplasm of prostate: Secondary | ICD-10-CM

## 2020-08-17 ENCOUNTER — Encounter (HOSPITAL_COMMUNITY)
Admission: RE | Admit: 2020-08-17 | Discharge: 2020-08-17 | Disposition: A | Discharge status: Home / Self Care | Payer: Medicare Other | Source: Ambulatory Visit | Attending: Urology | Admitting: Urology

## 2020-08-17 ENCOUNTER — Other Ambulatory Visit: Payer: Self-pay

## 2020-08-17 DIAGNOSIS — C61 Malignant neoplasm of prostate: Secondary | ICD-10-CM | POA: Insufficient documentation

## 2020-08-17 MED ORDER — TECHNETIUM TC 99M MEDRONATE IV KIT
21.5000 | PACK | Freq: Once | INTRAVENOUS | Status: AC
  Administered 2020-08-17: 21.5 via INTRAVENOUS

## 2020-09-04 LAB — COLOGUARD: COLOGUARD: NEGATIVE

## 2020-09-04 LAB — EXTERNAL GENERIC LAB PROCEDURE: COLOGUARD: NEGATIVE

## 2020-09-08 ENCOUNTER — Other Ambulatory Visit (HOSPITAL_COMMUNITY): Payer: Self-pay | Admitting: Urology

## 2020-09-08 DIAGNOSIS — C61 Malignant neoplasm of prostate: Secondary | ICD-10-CM

## 2020-09-13 ENCOUNTER — Other Ambulatory Visit: Payer: Self-pay

## 2020-09-13 MED ORDER — CARVEDILOL 3.125 MG PO TABS: ORAL_TABLET | ORAL | 1 refills | Status: DC

## 2020-09-28 ENCOUNTER — Other Ambulatory Visit: Payer: Self-pay

## 2020-09-28 ENCOUNTER — Ambulatory Visit (HOSPITAL_COMMUNITY)
Admission: RE | Admit: 2020-09-28 | Discharge: 2020-09-28 | Disposition: A | Discharge status: Home / Self Care | Payer: Medicare Other | Source: Ambulatory Visit | Attending: Urology | Admitting: Urology

## 2020-09-28 DIAGNOSIS — C61 Malignant neoplasm of prostate: Secondary | ICD-10-CM | POA: Insufficient documentation

## 2020-09-28 MED ORDER — PIFLIFOLASTAT F 18 (PYLARIFY) INJECTION
9.0000 | Freq: Once | INTRAVENOUS | Status: AC
  Administered 2020-09-28: 8.67 via INTRAVENOUS

## 2020-10-01 ENCOUNTER — Encounter: Payer: Self-pay | Admitting: Radiation Oncology

## 2020-10-01 ENCOUNTER — Telehealth: Payer: Self-pay | Admitting: *Deleted

## 2020-10-01 ENCOUNTER — Ambulatory Visit
Admission: RE | Admit: 2020-10-01 | Discharge: 2020-10-01 | Disposition: A | Discharge status: Home / Self Care | Payer: Medicare Other | Source: Ambulatory Visit | Attending: Radiation Oncology | Admitting: Radiation Oncology

## 2020-10-01 DIAGNOSIS — C61 Malignant neoplasm of prostate: Secondary | ICD-10-CM | POA: Insufficient documentation

## 2020-10-01 NOTE — Progress Notes (Signed)
Radiation Oncology         [phone]) [phone] ________________________________  Initial Outpatient Consultation - Conducted via Telephone due to current COVID-19 concerns for limiting patient exposure  Name: Andres Boyd MRN: [id number]  Date: 10/01/2020  DOB: [birth year]  XL:KGMWN, Hollie Beach, MD  Lucas Mallow, MD   REFERRING PHYSICIAN: Lucas Mallow, MD  DIAGNOSIS: 65 y.o. gentleman with Stage T2c adenocarcinoma of the prostate with Gleason score of 4+3, and PSA of 40.70.    ICD-10-CM   1. Malignant neoplasm of prostate ([place])  C61       HISTORY OF PRESENT ILLNESS: Andres Boyd is a 65 y.o. male with a diagnosis of prostate cancer. He was noted to have an elevated PSA of 38.9 by his primary care physician, Dr. Andria Frames.  Accordingly, he was referred for evaluation in urology by Dr. Gloriann Loan on 01/29/20,  digital rectal examination was performed at that time revealing some mild left apex induration. A repeat PSA performed that day showed persistent elevation at 40.7. The patient proceeded to transrectal ultrasound with 12 biopsies of the prostate on 06/08/20.  The prostate volume measured 27.15 cc.  Out of 12 core biopsies, all 12 were positive.  The maximum Gleason score was 4+3, and this was seen in the left base and all 6 right-sided cores. Additionally, Gleason 3+4 was seen in the remaining left-sided cores. Perineural invasion was identified.  He underwent staging CT A/P on 08/17/20 showing no evidence of metastatic disease. There was mild diffuse bladder wall thickening, likely due to chronic bladder outlet obstruction. Bone scan performed the same day showed very subtle areas of increased activity noted over multiple posterior ribs but no other bony abnormalities noted.  A PSMA scan was performed on 09/28/20 for further evaluation and this showed marked radiotracer accumulation in the prostate but no  evidence of disease elsewhere. There was mild, nonspecific uptake about the mediastinum in this  patient with a history of prior granulomatous disease. Also noted was mild nonspecific uptake in the axillary regions bilaterally within unenlarged and otherwise normal-appearing lymph nodes.  The patient reviewed the biopsy and imaging results with his urologist and he has kindly been referred today for discussion of potential radiation treatment options.   PREVIOUS RADIATION THERAPY: No  PAST MEDICAL HISTORY:  Past Medical History:  Diagnosis Date   1st degree AV block    CHF (congestive heart failure) (HCC)    CKD (chronic kidney disease), stage III (HCC)    ETOH abuse    GERD (gastroesophageal reflux disease)    Hyperlipidemia    Hypertension    Hyponatremia    Mild CAD    Myocardial infarction (HCC)    NICM (nonischemic cardiomyopathy) ([place])    PAD (peripheral artery disease) ([place])    a. LE vasc testing 03/2017 - LE duplex for claudication 03/2017 showing 30-49% stenosis in R popliteal and total occlusion in posterior tibial. Managed medically.   Prolonged QT interval    Sinus bradycardia    Tobacco abuse       PAST SURGICAL HISTORY: Past Surgical History:  Procedure Laterality Date   APPENDECTOMY     CARDIAC CATHETERIZATION N/A 07/19/2015   Procedure: Left Heart Cath and Coronary Angiography;  Surgeon: Jettie Booze, MD;  Location: [facility];  Service: Cardiovascular;  Laterality: N/A;   LAPAROSCOPIC APPENDECTOMY N/A 12/30/2018   Procedure: APPENDECTOMY LAPAROSCOPIC;  Surgeon: Kinsinger, Arta Bruce, MD;  Location: [facility];  Service: General;  Laterality: N/A;  LAPAROSCOPIC LYSIS OF ADHESIONS N/A 12/30/2018   Procedure: LAPAROSCOPIC LYSIS OF ADHESIONS;  Surgeon: Kinsinger, Arta Bruce, MD;  Location: [facility];  Service: General;  Laterality: N/A;   LEFT HEART CATH AND CORONARY ANGIOGRAPHY N/A 07/04/2016   Procedure: Left Heart Cath and Coronary Angiography;  Surgeon: Jettie Booze, MD;  Location: [facility];  Service: Cardiovascular;  Laterality: N/A;    LEFT HEART CATH AND CORONARY ANGIOGRAPHY N/A 09/06/2016   Procedure: LEFT HEART CATH AND CORONARY ANGIOGRAPHY;  Surgeon: Sherren Mocha, MD;  Location: [facility];  Service: Cardiovascular;  Laterality: N/A;    FAMILY HISTORY:  Family History  Problem Relation Age of Onset   Diabetes Mother    Hypertension Mother    Diabetes Father    Diabetes Maternal Grandmother    Hypertension Maternal Grandmother    Diabetes Maternal Grandfather    Hypertension Maternal Grandfather    Colon cancer Neg Hx    Colon polyps Neg Hx    Esophageal cancer Neg Hx    Rectal cancer Neg Hx    Stomach cancer Neg Hx     SOCIAL HISTORY:  Social History   Socioeconomic History   Marital status: Single    Spouse name: Not on file   Number of children: Not on file   Years of education: Not on file   Highest education level: Not on file  Occupational History   Not on file  Tobacco Use   Smoking status: Every Day    Packs/day: 1.00    Years: 36.00    Pack years: 36.00    Types: Cigarettes    Start date: 01/1972   Smokeless tobacco: Never  Vaping Use   Vaping Use: Never used  Substance and Sexual Activity   Alcohol use: Yes    Alcohol/week: 2.0 - 3.0 standard drinks    Types: 2 - 3 Cans of beer per week    Comment: 2-3 32oz cans daily   Drug use: Yes    Types: Marijuana    Comment: randomly    Sexual activity: Not on file  Other Topics Concern   Not on file  Social History Narrative   Patient unemployed. Lives in [place] with son.    Social Determinants of Health   Financial Resource Strain: Not on file  Food Insecurity: Not on file  Transportation Needs: Not on file  Physical Activity: Not on file  Stress: Not on file  Social Connections: Not on file  Intimate Partner Violence: Not on file    ALLERGIES: Patient has no known allergies.  MEDICATIONS:  Current Outpatient Medications  Medication Sig Dispense Refill   albuterol (VENTOLIN HFA) 108 (90 Base) MCG/ACT  inhaler SMARTSIG:1 Puff(s) Via Inhaler Every 4 Hours PRN     carvedilol (COREG) 3.125 MG tablet TAKE 1 TABLET(3.125 MG) BY MOUTH TWICE DAILY 180 tablet 1   Multiple Vitamins-Minerals (CENTRUM ADULTS PO) Take by mouth. Takes most days     nitroGLYCERIN (NITROSTAT) 0.4 MG SL tablet Place 1 tablet (0.4 mg total) under the tongue every 5 (five) minutes as needed for chest pain. 90 tablet 3   pantoprazole (PROTONIX) 40 MG tablet TAKE 1 TABLET(40 MG) BY MOUTH DAILY 90 tablet 3   sacubitril-valsartan (ENTRESTO) 49-51 MG Take 1 tablet by mouth 2 (two) times daily. 180 tablet 3   spironolactone (ALDACTONE) 25 MG tablet TAKE 1 TABLET BY MOUTH EVERY DAY. 90 tablet 3   atorvastatin (LIPITOR) 20 MG tablet Take 1 tablet (20 mg  total) by mouth daily at 6 PM. 90 tablet 3   buPROPion (WELLBUTRIN SR) 150 MG 12 hr tablet Take one tab a day for first three days, then increase to 2 tabs a day for next 12 weeks. (Patient not taking: Reported on 10/01/2020) 60 tablet 2   No current facility-administered medications for this encounter.    REVIEW OF SYSTEMS:  On review of systems, the patient reports that he is doing well overall. He denies any chest pain, shortness of breath, cough, fevers, chills, night sweats, unintended weight changes. He denies any bowel disturbances, and denies abdominal pain, nausea or vomiting. He denies any new musculoskeletal or joint aches or pains. His IPSS was 15, indicating moderate urinary symptoms with nocturia x3, weak stream and frequency. His SHIM was 16, indicating he has mild-moderate erectile dysfunction. A complete review of systems is obtained and is otherwise negative.  PHYSICAL EXAM:  Wt Readings from Last 3 Encounters:  05/20/20 199 lb (90.3 kg)  03/22/20 207 lb (93.9 kg)  03/17/20 205 lb (93 kg)   Temp Readings from Last 3 Encounters:  03/17/20 97.7 F (36.5 C)  12/31/18 98.6 F (37 C) (Oral)  12/29/16 98.6 F (37 C) (Oral)   BP Readings from Last 3 Encounters:   05/20/20 100/62  03/22/20 126/78  03/17/20 129/88   Pulse Readings from Last 3 Encounters:  05/20/20 71  03/22/20 64  03/17/20 (!) 59   Pain Assessment Pain Score: 3  Pain Loc: Back/10  Unable to assess due to telephone consult visit format.   KPS = 100  100 - Normal; no complaints; no evidence of disease. 90   - Able to carry on normal activity; minor signs or symptoms of disease. 80   - Normal activity with effort; some signs or symptoms of disease. 57   - Cares for self; unable to carry on normal activity or to do active work. 60   - Requires occasional assistance, but is able to care for most of his personal needs. 50   - Requires considerable assistance and frequent medical care. 50   - Disabled; requires special care and assistance. 3   - Severely disabled; hospital admission is indicated although death not imminent. 16   - Very sick; hospital admission necessary; active supportive treatment necessary. 10   - Moribund; fatal processes progressing rapidly. 0     - Dead  Karnofsky DA, Abelmann Pettis, Craver LS and Burchenal [facility] [phone]) The use of the nitrogen mustards in the palliative treatment of carcinoma: with particular reference to bronchogenic carcinoma Cancer 1 634-56  LABORATORY DATA:  Lab Results  Component Value Date   WBC 4.5 08/13/2019   HGB 13.9 08/13/2019   HCT 41.1 08/13/2019   MCV 96 08/13/2019   PLT 262 08/13/2019   Lab Results  Component Value Date   NA 139 12/17/2019   K 4.7 12/17/2019   CL 101 12/17/2019   CO2 22 12/17/2019   Lab Results  Component Value Date   ALT 26 08/13/2019   AST 29 08/13/2019   ALKPHOS 59 08/13/2019   BILITOT 0.4 08/13/2019     RADIOGRAPHY: NM PET (PSMA) SKULL TO MID THIGH  Result Date: 09/29/2020 CLINICAL DATA:  Initial staging of prostate cancer, recent diagnosis of prostate cancer and elevated PSA to 40.7 EXAM: NUCLEAR MEDICINE PET SKULL BASE TO THIGH TECHNIQUE: 8.67 mCi F18 Piflufolastat (Pylarify) was  injected intravenously. Full-ring PET imaging was performed from the skull base to thigh after the radiotracer. CT  data was obtained and used for attenuation correction and anatomic localization. COMPARISON:  Comparison made with CT of the abdomen and pelvis of August 17, 2020. FINDINGS: NECK No radiotracer activity in neck lymph nodes. Incidental CT finding: None CHEST Scattered small bilateral axillary lymph nodes with low level activity. Signs of prior granulomatous disease with mild generalized radiotracer accumulation, not suspicious for prostate metastases. Incidental CT finding: Calcified atheromatous plaque of the thoracic aorta. Calcified coronary artery disease. Normal heart size without substantial pericardial effusion. No adenopathy by size criteria in the chest. Signs of pulmonary emphysema. Moderate and worse at the lung apices. No effusion. No consolidative process. Airways are patent. ABDOMEN/PELVIS Prostate: Marked increased activity in the prostate bed compatible with known tumor in this area, diffuse involvement of the prostate with a maximum SUV of 51.7. Lymph nodes: No abnormal radiotracer accumulation within pelvic or abdominal nodes. Liver: No evidence of liver metastasis Incidental CT finding: No acute findings relative to liver, gallbladder, pancreas, spleen, adrenal glands or kidneys. Urinary bladder under distended limiting assessment. No acute gastrointestinal process. Aortic atherosclerosis without aneurysm of the abdominal aorta. SKELETON No focal  activity to suggest skeletal metastasis. IMPRESSION: Marked radiotracer accumulation in the prostate gland in this patient with known prostate cancer. No signs of disease elsewhere in the neck, chest, abdomen or pelvis. Mild nonspecific uptake about the mediastinum in this patient with signs of prior granulomatous disease. Area follows diffuse tracheobronchial pattern. Correlate with any ongoing inflammation or respiratory symptoms. Mild  nonspecific uptake in the axillary regions bilaterally within unenlarged and otherwise normal appearing lymph nodes, not suspicious for metastatic disease from prostate cancer based on distribution and lack of substantial radiotracer accumulation. Aortic Atherosclerosis (ICD10-I70.0). Electronically Signed   By: Zetta Bills M.D.   On: 09/29/2020 15:26      IMPRESSION/PLAN: This visit was conducted via Telephone to spare the patient unnecessary potential exposure in the healthcare setting during the current COVID-19 pandemic. 1. 65 y.o. gentleman with Stage T2c adenocarcinoma of the prostate with Gleason Score of 4+3, and PSA of 40.7. We discussed the patient's workup and outlined the nature of prostate cancer in this setting. The patient's T stage, Gleason's score, and PSA put him into the high risk group. Accordingly, he is eligible for a variety of potential treatment options including prostatectomy or ADT in combination with either 8 weeks of external radiation or 5 weeks of external radiation preceded by a brachytherapy boost. We discussed the available radiation techniques, and focused on the details and logistics and delivery. We discussed and outlined the risks, benefits, short and long-term effects associated with radiotherapy and compared and contrasted these with prostatectomy. We discussed the role of SpaceOAR in reducing the rectal toxicity associated with radiotherapy. We also detailed the role of ADT in the treatment of high risk prostate cancer and outlined the associated side effects that could be expected with this therapy. He was encouraged to ask questions that were answered to his stated satisfaction.  At the end of the conversation the patient is interested in moving forward with 8 weeks of external beam therapy concurrent with ADT. He has not started ADT as of yet. Therefore, we will share our discussion with Dr. Gloriann Loan and make arrangements for a follow up visit, first available to  start ADT now.  We discussed that he understands the rationale behind the intentional 55-month delay of starting radiotherapy after ADT to allow the radiosensitizing effects of this therapy.  We will plan to coordinate for fiducial  markers and SpaceOAR gel placement in early December 2022, prior to simulation, to reduce rectal toxicity from radiotherapy. The patient appears to have a good understanding of his disease and our treatment recommendations which are of curative intent and is in agreement with the stated plan.  Therefore, we will move forward with treatment planning accordingly, in anticipation of beginning IMRT approximately 2 months after starting ADT.  We enjoyed meeting with him today and look forward to continuing to participate in his care.   Given current concerns for patient exposure during the COVID-19 pandemic, this encounter was conducted via telephone. The patient was notified in advance and was offered a MyChart meeting to allow for face to face communication but unfortunately reported that he did not have the appropriate resources/technology to support such a visit and instead preferred to proceed with telephone consult. The patient has given verbal consent for this type of encounter. The attendants for this meeting include Tyler Pita MD, Ashlyn Bruning PA-C and patient, Andres Boyd. During the encounter, Tyler Pita MD and Freeman Caldron PA-C were located at [facility].  Patient, Andres Boyd was located at home.  We personally spent 70 minutes in this encounter including chart review, reviewing radiological studies, telephone conversation with the patient, entering orders, coordinating his care and completing documentation.    Nicholos Johns, PA-C    Tyler Pita, MD  [facility] Direct Dial: [phone]  Fax: [phone] [email or web address]  Skype  [email or web address]  This document serves as a record  of services personally performed by Tyler Pita, MD and Freeman Caldron, PA-C. It was created on their behalf by Wilburn Mylar, a trained medical scribe. The creation of this record is based on the scribe's personal observations and the provider's statements to them. This document has been checked and approved by the attending provider.

## 2020-10-01 NOTE — Progress Notes (Signed)
GU Location of Tumor / Histology: Prostate Ca  If Prostate Cancer, Gleason Score is (4 + 3) and PSA is (40.70 as of 1/22)   Biopsies: Dr. Gloriann Loan     Past/Anticipated interventions by urology, if any:   Past/Anticipated interventions by medical oncology, if any:   Weight changes, if any: Not at this time.  Bowel/Bladder complaints, if any: Denies constipation/diarrhea at this time.   Nausea/Vomiting, if any: None  Pain issues, if any:  2-3 on pain scale  SAFETY ISSUES: Prior radiation? No Pacemaker/ICD? No Possible current pregnancy? Male Is the patient on methotrexate? No  Current Complaints / other details:  Just want to learn more about treatment for his cancer.

## 2020-10-01 NOTE — Telephone Encounter (Signed)
CALLED PATIENT TO INFORM OF ADT APPT. ON 10-05-20- ARRIVAL TIME- 12:30 PM @ [facility], SPOKE WITH PATIENT AND HE IS AWARE OF THIS APPT.

## 2020-10-04 NOTE — Progress Notes (Signed)
Spoke with patient via telephone to introduce myself as the prostate nurse navigator and discussed my role.  Barriers to care identified at this time include transportation once radiation is scheduled.  Informed patient once radiation is scheduled I will assist with coordinating transportation.  Patient is scheduled at [facility] 10/05/2020 for ADT.  I provided my contact information and asked for him to call me with questions or concerns.  Verbalized understanding.

## 2020-10-12 ENCOUNTER — Telehealth: Payer: Self-pay | Admitting: *Deleted

## 2020-10-12 NOTE — Telephone Encounter (Signed)
CALLED PATIENT TO INFORM OF FID. MARKERS AND SPACE OAR TO BE PLACED ON 12-09-20 @ [facility] AND HIS SIM ON 12-13-20- ARRIVAL TIME- 2:15 PM @ [facility], SPOKE WITH PATIENT AND HE IS AWARE OF THESE APPTS.

## 2020-12-13 ENCOUNTER — Ambulatory Visit: Payer: Medicare Other | Admitting: Radiation Oncology

## 2020-12-15 IMAGING — CT CT RENAL STONE PROTOCOL
2 of 4 series · 17 of 46 positions shown, 19 images · non-contrast
Comparison: 07/01/2007

CLINICAL DATA: Right flank pain with kidney stone suspected.

EXAM:
CT ABDOMEN AND PELVIS WITHOUT CONTRAST
TECHNIQUE: Multidetector CT imaging of the abdomen and pelvis was performed
following the standard protocol without IV contrast.

[Series 4: ap without · axial · non-contrast · 0.75mm/px · z∈[-1005,-605]mm · 14 of 90 slices shown, 16 images]
[im 5/90  soft-tissue]
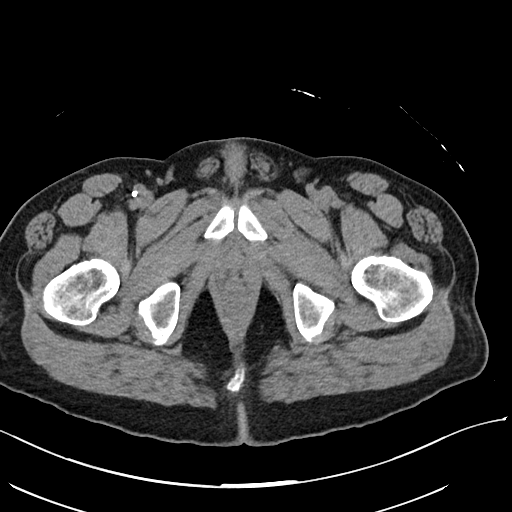
[im 5/90  bone]
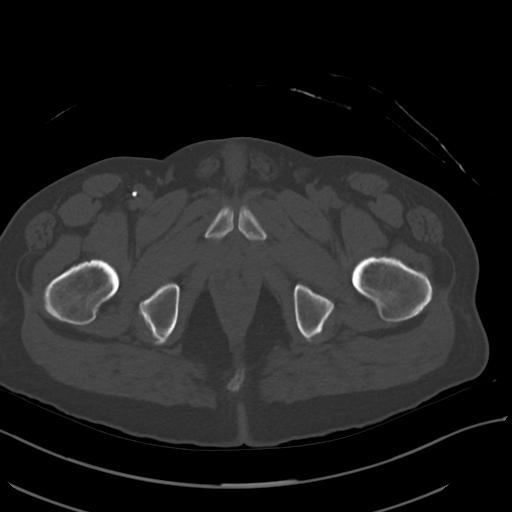
[im 10/90  soft-tissue]
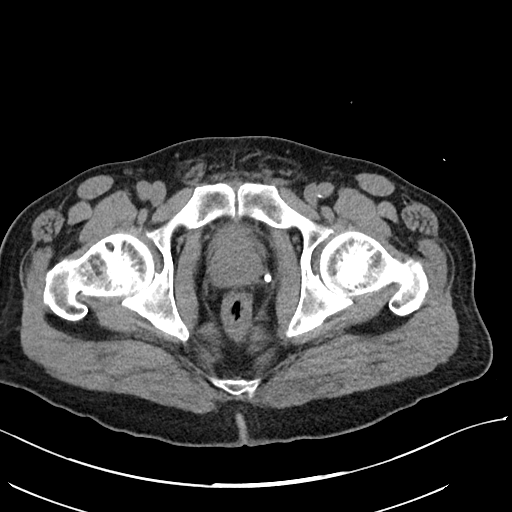
[im 20/90  soft-tissue]
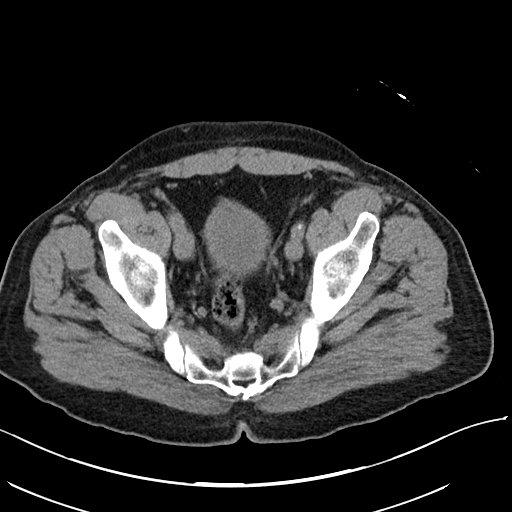
[im 25/90  soft-tissue]
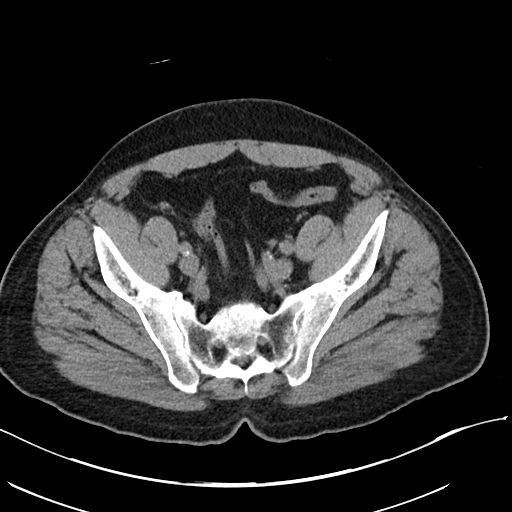
[im 30/90  soft-tissue]
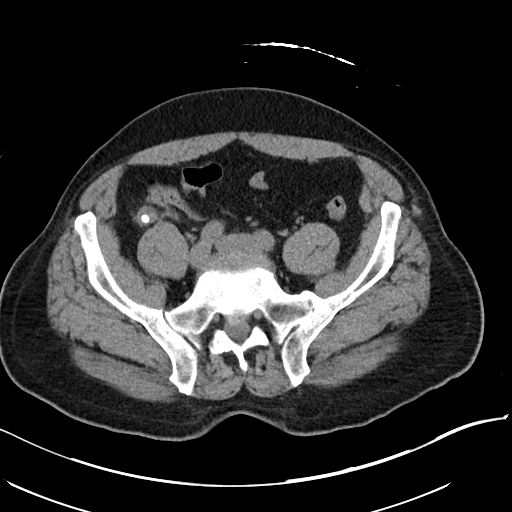
[im 35/90  soft-tissue]
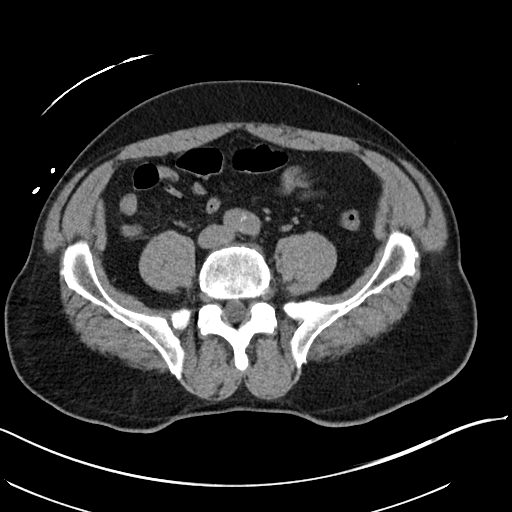
[im 40/90  soft-tissue]
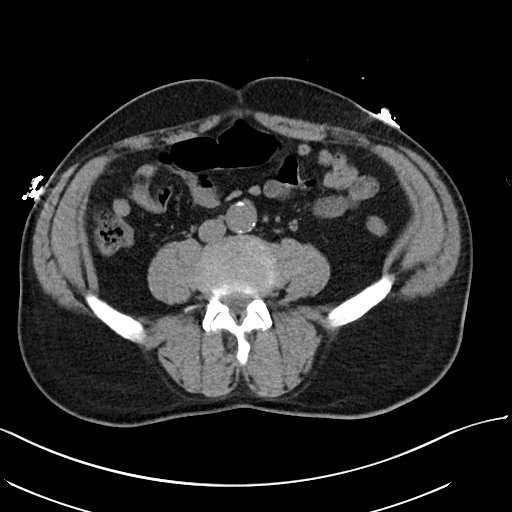
[im 50/90  soft-tissue]
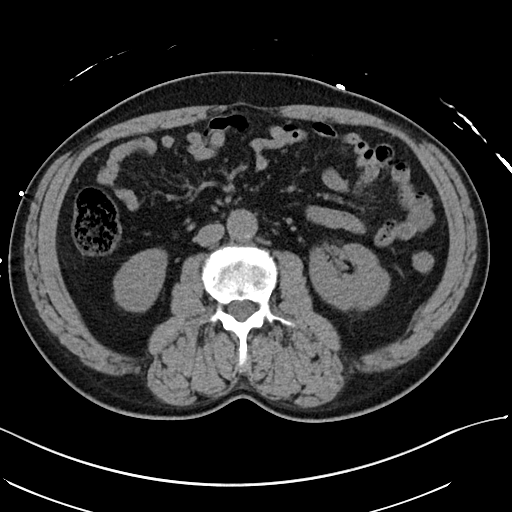
[im 55/90  soft-tissue]
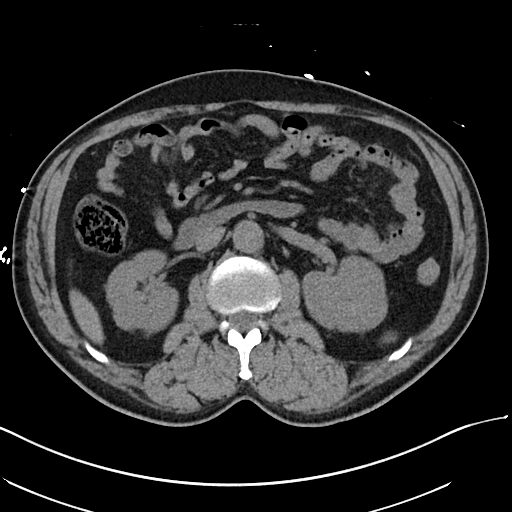
[im 55/90  bone]
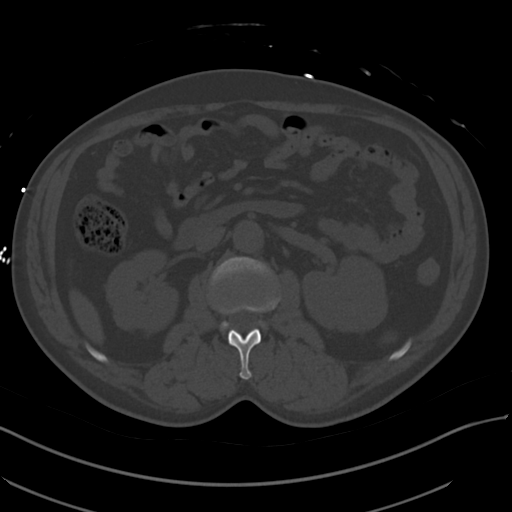
[im 60/90  soft-tissue]
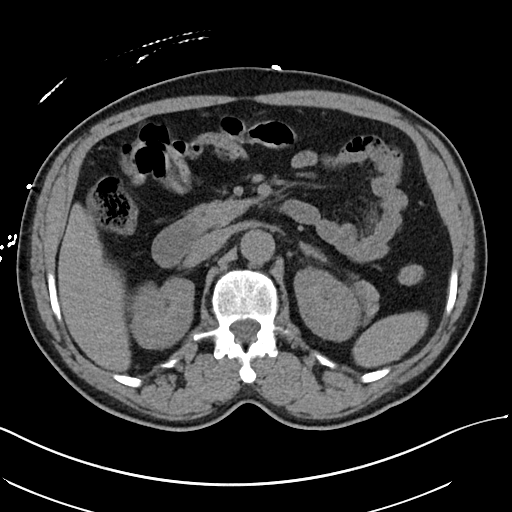
[im 65/90  soft-tissue]
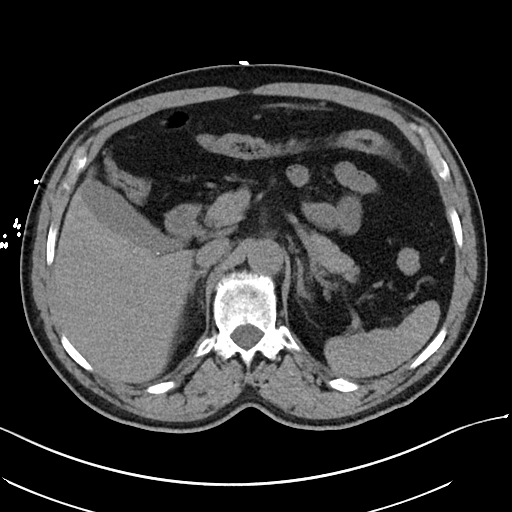
[im 70/90  soft-tissue]
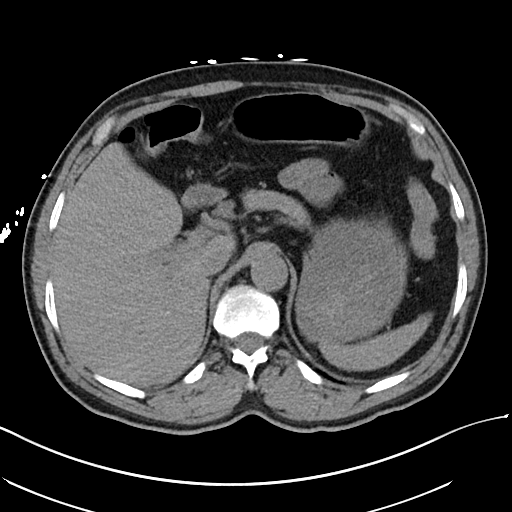
[im 80/90  soft-tissue]
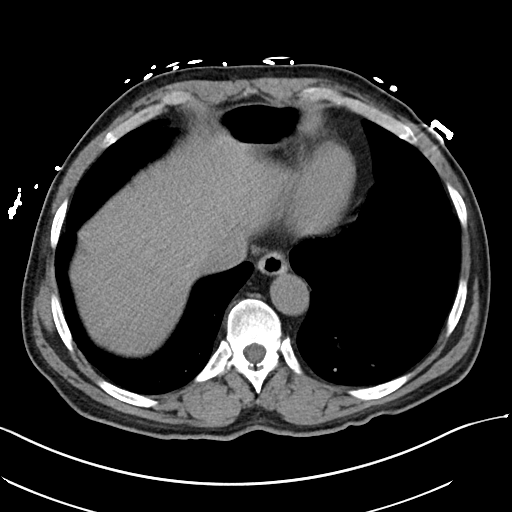
[im 85/90  soft-tissue]
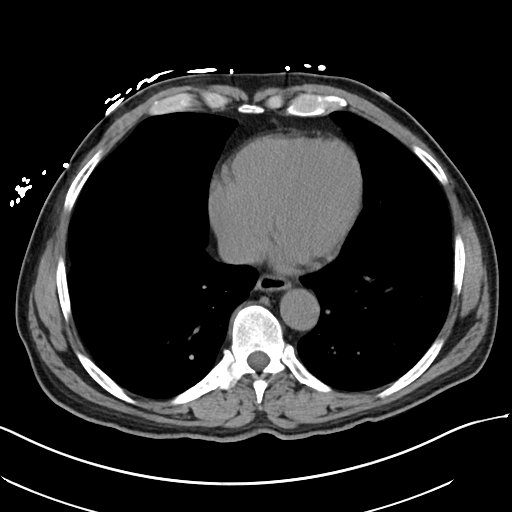

[Series 7: cor · coronal · 0.74mm/px · 3 of 98 slices shown]
[im 33/98  soft-tissue]
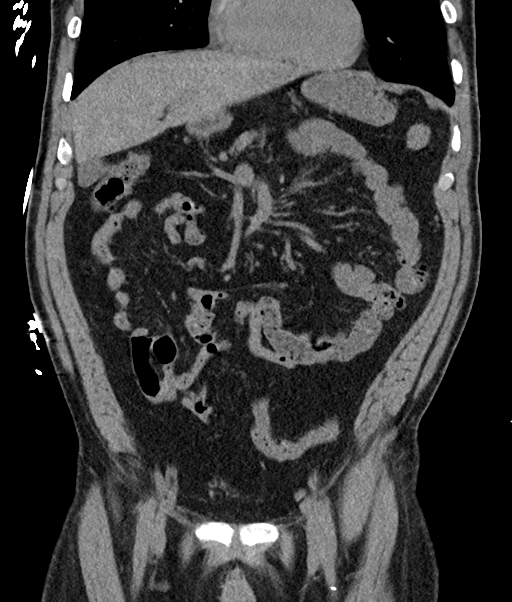
[im 44/98  soft-tissue]
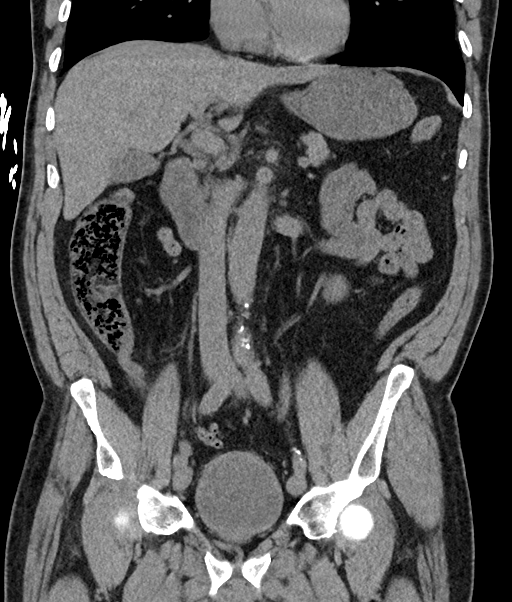
[im 54/98  soft-tissue]
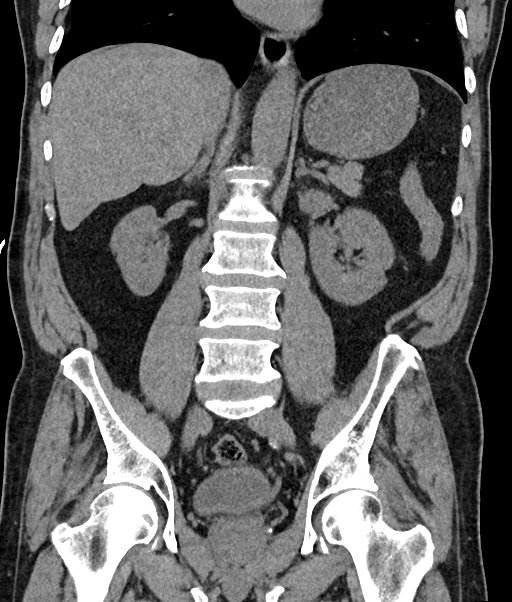

[17 of 46 positions shown; findings below may reference images not displayed]

FINDINGS: Lower chest:  No contributory findings.

Hepatobiliary: No focal liver abnormality.Punctate calcified
gallstone. No evidence of gallbladder inflammation.

Pancreas: Unremarkable.

Spleen: Unremarkable.

Adrenals/Urinary Tract: Negative adrenals. No hydronephrosis or
stone. Unremarkable bladder.

Stomach/Bowel: Thickened appendix with mild fat edema around an
appendicolith which measures 9 mm. Outer walls of the appendix
measures up to 16 mm in diameter. The appendix is in expected
location extending inferiorly from the cecum. No evidence of
collection.

Vascular/Lymphatic: No acute vascular abnormality. Atherosclerotic
calcification no mass or adenopathy.

Reproductive:No pathologic findings.

Other: No ascites or pneumoperitoneum.

Musculoskeletal: No acute abnormalities.
IMPRESSION: 1. Acute appendicitis with appendicolith.
2. Tiny calcified gallstone.

## 2020-12-28 ENCOUNTER — Ambulatory Visit: Payer: Medicare Other | Admitting: Radiation Oncology

## 2021-01-17 ENCOUNTER — Other Ambulatory Visit: Payer: Self-pay

## 2021-01-17 MED ORDER — CARVEDILOL 3.125 MG PO TABS: ORAL_TABLET | ORAL | 0 refills | Status: DC

## 2021-01-18 NOTE — Progress Notes (Signed)
Pt was a consult on 10/01/2020 for Stage T2c adenocarcinoma of the prostate with Gleason Score of 4+3, and PSA of 40.7.  Recommendations 8 weeks of external beam therapy concurrent with ADT.   Pt received Firmagon on 11/05/2020, and was to return for repeat PSA and initiation of Eligard.  Patient no showed for an appointment on 12/8, but did have a repeat PSA on 01/04/21.    Pt had to cancel appointment on 12/15 for fiducial's and SpaceOAR placement due to death in the family.   RN attempted to reach out to patient to ensure we get him back on schedule for ADT, Fiducial's, SpaceOAR, and CT Sim, no answer, no option to leave voicemail.  RN will continue to reach out via telephone, along will mailed letter today.

## 2021-01-21 NOTE — Progress Notes (Signed)
RN attempted to contact patient, no answer, no option to leave voicemail.

## 2021-01-31 ENCOUNTER — Other Ambulatory Visit: Payer: Self-pay | Admitting: Urology

## 2021-02-08 ENCOUNTER — Telehealth: Payer: Self-pay

## 2021-02-08 NOTE — Telephone Encounter (Signed)
RN attempted to contact patient to assess navigator needs.  Previously patient had voiced needed for transportation assistance.  No answer, no option to leave voicemail.  Will continue to try to reach patient, or meet with him face to face @ CT Sim on 3/13.

## 2021-02-10 NOTE — Progress Notes (Signed)
RN attempted to contact patient to assess navigator needs.  Previously patient had voiced needed for transportation assistance.  No answer, no option to leave voicemail.  Will continue to try to reach patient, or meet with him face to face @ CT Sim on 3/13.   Letter mailed with contact information to reach out with any navigation needs.

## 2021-03-03 ENCOUNTER — Other Ambulatory Visit: Payer: Self-pay

## 2021-03-03 ENCOUNTER — Encounter (HOSPITAL_BASED_OUTPATIENT_CLINIC_OR_DEPARTMENT_OTHER): Payer: Self-pay | Admitting: Urology

## 2021-03-03 NOTE — Progress Notes (Signed)
Spoke w/ via phone for pre-op interview--- pt ?Lab needs dos----  istat             ?Lab results------ current ekg in epic/ chart ?COVID test -----patient states asymptomatic no test needed ?Arrive at ------- 0630 on 03-08-2021 ?NPO after MN NO Solid Food.  Clear liquids from MN until--- 0530 ?Med rec completed ?Medications to take morning of surgery ----- coreg, protonix, entresto ?Diabetic medication ----- n/a ?Patient instructed no nail polish to be worn day of surgery ?Patient instructed to bring photo id and insurance card day of surgery ?Patient aware to have Driver (ride ) / caregiver  for 24 hours after surgery -- son, Colonel Bald ?Patient Special Instructions ----- will do one fleet enema night before surgery ?Pre-Op special Istructions ----- n/a ?Patient verbalized understanding of instructions that were given at this phone interview. ?Patient denies shortness of breath, chest pain, fever, cough at this phone interview.  ? ? ?Anesthesia Review: HTN; h x NSTEMI 07/ 2017; mild CAD;  NICM last ef 45-50% per echo 10/ 8280;  chronic systolic CHF;  PAD;  COPD w/ sob with fast long distance walking, no sob with stairs;  CKD 3 ?Pt denies cardiac s&s and no peripheral swelling. Pt stated last taken nitro approx 12/ 2022. ? ?PCP: Dr A. Wells ?Cardiologist : Dr Liane Comber (lov 03-22-2020 epic) ?Pulmonology:  Dr Lamonte Sakai Cassell Clement 05-20-2020 epic) ?Chest x-ray : CT 05-27-2020 ?EKG : 03-22-2020 ?Echo : 10-28-2018 ?Stress test: nuclear 07-03-2016 ?Cardiac Cath : 09-06-2016 ?Activity level: see aboe ?Sleep Study/ CPAP : no ? ?Blood Thinner/ Instructions /Last Dose: NO ?ASA / Instructions/ Last Dose :  ASA 81mg / pt stated was given instructions from dr Milford Cage office to stop week before surgery, stated last dose last week ?

## 2021-03-07 NOTE — H&P (Signed)
HPI:  ?01/29/2020  ?66 year old male is referred for an elevated PSA of the 38.9 on 01/16/2020. He has not had a recheck. He has mild voiding complaints including some frequency, nocturia, postvoid dribbling, intermittency, trouble initiating stream, and weak stream. He is not bothered by these symptoms. He denies any hematuria dysuria. He has a history cardiovascular disease. He denies being on any blood thinners other than a baby aspirin.  ? ?09/01/2020  ?Patient is status post prostate biopsy which revealed 12/12 biopsies positive for adenocarcinoma of the prostate maximum Gleason score 4+3. PSA was 41. He did have some induration on his prostate. He underwent staging imaging with CT scan of the abdomen and pelvis which was negative for metastatic disease. Bone scan revealed some indeterminate rib lesions for which additional imaging was recommended. He has had an appendectomy in the past. Otherwise no abdominal surgeries. He has mild erectile dysfunction. He admits to history of MI and occasionally has to take nitroglycerin. He has not been set up with a cardiologist yet Since his other 1 left town P  ? ?10/05/2020  ?Patient underwent a PET scan that revealed no evidence of metastatic disease. He had marked uptake in the prostate gland consistent with his known prostate cancer. He has met with Dr. Tammi Klippel and elected to proceed with ADT plus external radiation. He presents today to discuss initiating ADT.  ? ?11/05/2020: He presents today for [place] injection. Has been unable to receive this until today. Denies any changes to his voiding habits.  ? ?01/27/2021: After last visit, the patient was scheduled to return for fiducial marker placement as well as SpaceOAR insertion but he canceled the procedure due to a death in the family. He was also scheduled to return for repeat labs and continued ADT in the form of Eligard. He has had repeat labs but has yet to receive additional ADT. Due to this delay, he will  have [place] repeated again today. He is also now ready to proceed with previously planned fiducial marker placement and SpaceOAR. Patient continues to void at his baseline with stable, grossly nonbothersome symptomology. Denies any interval dysuria or gross hematuria. UA unremarkable today. He did not have significant side effects from [place] initially other than some injection site discomfort. Denies any recent fevers or chills, nausea/vomiting. PSA assessed earlier this month is now 3.2. Testosterone at near castrate level.  ? ?02/24/2021  ?PSA is 1.61, testosterone castrate. He is scheduled for marker seed placement and SpaceOAR on 03/08/2021. He is having hot flashes but otherwise tolerating ADT well.  ? ?  ?CC: AUA Questions Scoring.  ?HPI: Andres Boyd is a 66 year-old male established patient who is here AUA Questions. ? ? ? ?  ?AUA Symptom Score: 50% of the time he has the sensation of not emptying his bladder completely when finished urinating. More than 50% of the time he has to urinate again fewer than two hours after he has finished urinating. Less than 20% of the time he has to start and stop again several times when he urinates. Less than 20% of the time he finds it difficult to postpone urination. He never has a weak urinary stream. Less than 50% of the time he has to push or strain to begin urination. He has to get up to urinate 3 times from the time he goes to bed until the time he gets up in the morning.  ? ?Calculated AUA Symptom Score: 14 ? ?  ?ALLERGIES: None   ?MEDICATIONS: Aspirin 81  mg tablet,chewable  ?Atorvastatin Calcium 20 mg tablet  ?Bupropion Hcl Sr 150 mg tablet, extended release 12 hr  ?Carvedilol 3.125 mg tablet  ?Entresto  ?Nitroglycerin  ?Pantoprazole Sodium  ?Spironolactone  ?   ?GU PSH: Locm 300-399Mg/Ml Iodine,1Ml - 08/17/2020 ?Prostate Needle Biopsy - 06/08/2020 ? ?   ?NON-GU PSH: Appendectomy ?Surgical Pathology, Gross And Microscopic Examination For Prostate Needle -  06/08/2020 ? ?   ? ?   ?GU PMH: Prostate Cancer - 01/27/2021, - 11/05/2020, - 10/05/2020, - 09/01/2020 ?Elevated PSA - 08/17/2020, - 06/08/2020, - 01/29/2020 ?BPH w/LUTS - 01/29/2020 ?Encounter for Prostate Cancer screening - 01/29/2020 ?Nocturia - 01/29/2020 ?Urinary Frequency - 01/29/2020 ?Urinary Hesitancy - 01/29/2020 ?Weak Urinary Stream - 01/29/2020 ?   ?NON-GU PMH: Acute gastric ulcer with hemorrhage ?GERD ?Myocardial Infarction ?   ?FAMILY HISTORY: 1 Daughter - Other ?2 sons - Other   ?SOCIAL HISTORY: Marital Status: Single ?Preferred Language: [place]; Race: Black or African American ?Current Smoking Status: Patient smokes occasionally.  ?<DIV'  ?Tobacco Use Assessment Completed:  ?Used Tobacco in last 30 days?  ? ?Drinks 1 caffeinated drink per day. ?   ?REVIEW OF SYSTEMS:    ?GU Review Male:  Patient reports frequent urination, get up at night to urinate, stream starts and stops, and trouble starting your stream. Patient denies hard to postpone urination, burning/ pain with urination, leakage of urine, have to strain to urinate , erection problems, and penile pain.   ?Gastrointestinal (Upper):  Patient denies nausea, vomiting, and indigestion/ heartburn.   ?Gastrointestinal (Lower):  Patient denies diarrhea and constipation.   ?Constitutional:  Patient denies fever, night sweats, weight loss, and fatigue.   ?Skin:  Patient denies skin rash/ lesion and itching.   ?Eyes:  Patient denies blurred vision and double vision.   ?Ears/ Nose/ Throat:  Patient denies sore throat and sinus problems.   ?Hematologic/Lymphatic:  Patient denies swollen glands and easy bruising.   ?Cardiovascular:  Patient denies leg swelling and chest pains.   ?Respiratory:  Patient denies cough and shortness of breath.   ?Endocrine:  Patient denies excessive thirst.   ?Musculoskeletal:  Patient denies back pain and joint pain.   ?Neurological:  Patient denies headaches and dizziness.   ?Psychologic:  Patient denies depression and anxiety.   ?Notes:  Weak stream ?   ?VITAL SIGNS: None   ?MULTI-SYSTEM PHYSICAL EXAMINATION:    ?Gastrointestinal: No mass, no tenderness, no rigidity, non obese abdomen.   ? ?   ? ?Complexity of Data:  ? ?Source Of History:  Patient  ?Lab Test Review:  PSA, Total Testosterone  ?Records Review:  AUA Symptom Score, Previous Doctor Records, Previous Patient Records  ?Urine Test Review:  Urinalysis  ? ? 02/15/21 01/04/21 12/09/20 10/28/20 01/29/20  ?PSA  ?Total PSA 1.61 ng/mL 3.22 ng/mL 6.49 ng/mL 43.40 ng/mL 40.70 ng/mL  ? ? 02/15/21 01/04/21 12/09/20 10/28/20  ?Hormones  ?Testosterone, Total 14.2 ng/dL 13.0 ng/dL 22.0 ng/dL 406.9 ng/dL  ? ? ?PROCEDURES:   ? ?Urinalysis  ?Dipstick Dipstick Cont'd  ?Color: Yellow Bilirubin: Neg mg/dL  ?Appearance: Clear Ketones: Neg mg/dL  ?Specific Gravity: 1.025 Blood: Neg ery/uL  ?pH: 5.5 Protein: Trace mg/dL  ?Glucose: Neg mg/dL Urobilinogen: 0.2 mg/dL  ? Nitrites: Neg  ? Leukocyte Esterase: Neg leu/uL  ? ? ?Eligard 22.57m/ 3 Month - [phone] ?The arm/ abdomen/ hip/ thigh was sterilely prepped with alcohol. Eligard was injected subcutaneously ([place]) using standard technique. The patient tolerated the procedure well. A band aid was applied. The  site was dry when the patient left the exam room. The patient will return as scheduled. ? ?After treatment was administered, patient was observed without any adverse reactions noted.  ?  ? ?Qty: 22.5 Adm. By: Jairo Ben Dezantil  ?Unit: mg Lot No [id number]  ?Route: SQ Exp. Date 04/03/2022  ?Freq: None Mfgr.:   ?Site: Left Hip  ? ?ASSESSMENT:  ? ?  ICD-10 Details  ?1 GU:  Prostate Cancer - C61 Chronic, Stable  ?2  Nocturia - R35.1 Chronic, Stable  ? ?PLAN:   ?Schedule  ?Return Visit/Planned Activity: 3 Months - Extender, Eligard, PSA, Total Testosterone  ?Note: 18-monthEligard  ?Document  ?Letter(s):  Created for Patient: Clinical Summary  ? ?Notes:  Eligard today and again in 3 months. Gold seed marker placement and SpaceOAR scheduled for 3/7.  ? ? ?

## 2021-03-08 ENCOUNTER — Encounter (HOSPITAL_BASED_OUTPATIENT_CLINIC_OR_DEPARTMENT_OTHER): Payer: Self-pay | Admitting: Urology

## 2021-03-08 ENCOUNTER — Ambulatory Visit (HOSPITAL_BASED_OUTPATIENT_CLINIC_OR_DEPARTMENT_OTHER): Payer: 59 | Admitting: Certified Registered"

## 2021-03-08 ENCOUNTER — Ambulatory Visit (HOSPITAL_BASED_OUTPATIENT_CLINIC_OR_DEPARTMENT_OTHER)
Admission: RE | Admit: 2021-03-08 | Discharge: 2021-03-08 | Disposition: A | Discharge status: Home / Self Care | Payer: 59 | Attending: Urology | Admitting: Urology

## 2021-03-08 ENCOUNTER — Other Ambulatory Visit: Payer: Self-pay

## 2021-03-08 ENCOUNTER — Encounter (HOSPITAL_BASED_OUTPATIENT_CLINIC_OR_DEPARTMENT_OTHER)
Admission: RE | Disposition: A | Discharge status: Home / Self Care | Payer: Self-pay | Source: Home / Self Care | Attending: Urology

## 2021-03-08 DIAGNOSIS — K219 Gastro-esophageal reflux disease without esophagitis: Secondary | ICD-10-CM | POA: Diagnosis not present

## 2021-03-08 DIAGNOSIS — I252 Old myocardial infarction: Secondary | ICD-10-CM

## 2021-03-08 DIAGNOSIS — I509 Heart failure, unspecified: Secondary | ICD-10-CM | POA: Insufficient documentation

## 2021-03-08 DIAGNOSIS — F17200 Nicotine dependence, unspecified, uncomplicated: Secondary | ICD-10-CM | POA: Diagnosis not present

## 2021-03-08 DIAGNOSIS — J449 Chronic obstructive pulmonary disease, unspecified: Secondary | ICD-10-CM | POA: Insufficient documentation

## 2021-03-08 DIAGNOSIS — I110 Hypertensive heart disease with heart failure: Secondary | ICD-10-CM | POA: Diagnosis not present

## 2021-03-08 DIAGNOSIS — N529 Male erectile dysfunction, unspecified: Secondary | ICD-10-CM | POA: Insufficient documentation

## 2021-03-08 DIAGNOSIS — C61 Malignant neoplasm of prostate: Secondary | ICD-10-CM | POA: Diagnosis present

## 2021-03-08 DIAGNOSIS — R3912 Poor urinary stream: Secondary | ICD-10-CM | POA: Insufficient documentation

## 2021-03-08 DIAGNOSIS — I739 Peripheral vascular disease, unspecified: Secondary | ICD-10-CM | POA: Diagnosis not present

## 2021-03-08 DIAGNOSIS — R351 Nocturia: Secondary | ICD-10-CM | POA: Diagnosis not present

## 2021-03-08 HISTORY — PX: SPACE OAR INSTILLATION: SHX6769

## 2021-03-08 HISTORY — DX: Chronic obstructive pulmonary disease, unspecified: J44.9

## 2021-03-08 HISTORY — DX: Dyspnea, unspecified: R06.00

## 2021-03-08 HISTORY — DX: Male erectile dysfunction, unspecified: N52.9

## 2021-03-08 HISTORY — DX: Chronic systolic (congestive) heart failure: I50.22

## 2021-03-08 HISTORY — PX: GOLD SEED IMPLANT: SHX6343

## 2021-03-08 HISTORY — DX: Benign prostatic hyperplasia with lower urinary tract symptoms: N40.1

## 2021-03-08 HISTORY — DX: Malignant neoplasm of prostate: C61

## 2021-03-08 LAB — POCT I-STAT, CHEM 8
BUN: 15 mg/dL (ref 8–23)
Calcium, Ion: 1.31 mmol/L (ref 1.15–1.40)
Chloride: 104 mmol/L (ref 98–111)
Creatinine, Ser: 1 mg/dL (ref 0.61–1.24)
Glucose, Bld: 105 mg/dL — ABNORMAL HIGH (ref 70–99)
HCT: 42 % (ref 39.0–52.0)
Hemoglobin: 14.3 g/dL (ref 13.0–17.0)
Potassium: 3.9 mmol/L (ref 3.5–5.1)
Sodium: 142 mmol/L (ref 135–145)
TCO2: 27 mmol/L (ref 22–32)

## 2021-03-08 SURGERY — INSERTION, GOLD SEEDS
Anesthesia: Monitor Anesthesia Care

## 2021-03-08 MED ORDER — ONDANSETRON HCL 4 MG/2ML IJ SOLN
INTRAMUSCULAR | Status: DC | PRN
Start: 2021-03-08 — End: 2021-03-08
  Administered 2021-03-08: 4 mg via INTRAVENOUS

## 2021-03-08 MED ORDER — PROPOFOL 10 MG/ML IV BOLUS
INTRAVENOUS | Status: DC | PRN
  Administered 2021-03-08: 20 mg via INTRAVENOUS

## 2021-03-08 MED ORDER — PROPOFOL 1000 MG/100ML IV EMUL
INTRAVENOUS | Status: AC
  Filled 2021-03-08: qty 100

## 2021-03-08 MED ORDER — CEFAZOLIN SODIUM-DEXTROSE 2-4 GM/100ML-% IV SOLN
2.0000 g | INTRAVENOUS | Status: AC
  Administered 2021-03-08: 2 g via INTRAVENOUS

## 2021-03-08 MED ORDER — FLEET ENEMA 7-19 GM/118ML RE ENEM
1.0000 | ENEMA | Freq: Once | RECTAL | Status: DC
Start: 2021-03-08 — End: 2021-03-08

## 2021-03-08 MED ORDER — EPHEDRINE 5 MG/ML INJ
INTRAVENOUS | Status: AC
  Filled 2021-03-08: qty 5

## 2021-03-08 MED ORDER — MIDAZOLAM HCL 5 MG/5ML IJ SOLN
INTRAMUSCULAR | Status: DC | PRN
  Administered 2021-03-08: 2 mg via INTRAVENOUS

## 2021-03-08 MED ORDER — CEFAZOLIN SODIUM-DEXTROSE 2-4 GM/100ML-% IV SOLN
INTRAVENOUS | Status: AC
  Filled 2021-03-08: qty 100

## 2021-03-08 MED ORDER — PHENYLEPHRINE 40 MCG/ML (10ML) SYRINGE FOR IV PUSH (FOR BLOOD PRESSURE SUPPORT)
PREFILLED_SYRINGE | INTRAVENOUS | Status: DC | PRN
Start: 2021-03-08 — End: 2021-03-08
  Administered 2021-03-08: 80 ug via INTRAVENOUS

## 2021-03-08 MED ORDER — LIDOCAINE HCL (PF) 2 % IJ SOLN
INTRAMUSCULAR | Status: AC
Start: 2021-03-08 — End: ?
  Filled 2021-03-08: qty 15

## 2021-03-08 MED ORDER — SODIUM CHLORIDE 0.9 % IV SOLN: INTRAVENOUS | Status: DC

## 2021-03-08 MED ORDER — LIDOCAINE HCL (PF) 2 % IJ SOLN
INTRAMUSCULAR | Status: AC
  Filled 2021-03-08: qty 5

## 2021-03-08 MED ORDER — FENTANYL CITRATE (PF) 100 MCG/2ML IJ SOLN: 25.0000 ug | INTRAMUSCULAR | Status: DC | PRN

## 2021-03-08 MED ORDER — PROPOFOL 10 MG/ML IV BOLUS
INTRAVENOUS | Status: AC
Start: 2021-03-08 — End: ?
  Filled 2021-03-08: qty 20

## 2021-03-08 MED ORDER — FENTANYL CITRATE (PF) 100 MCG/2ML IJ SOLN
INTRAMUSCULAR | Status: AC
  Filled 2021-03-08: qty 2

## 2021-03-08 MED ORDER — LIDOCAINE HCL (CARDIAC) PF 100 MG/5ML IV SOSY
PREFILLED_SYRINGE | INTRAVENOUS | Status: DC | PRN
  Administered 2021-03-08: 20 mg via INTRAVENOUS
  Administered 2021-03-08: 60 mg via INTRAVENOUS

## 2021-03-08 MED ORDER — FENTANYL CITRATE (PF) 100 MCG/2ML IJ SOLN
INTRAMUSCULAR | Status: DC | PRN
  Administered 2021-03-08: 50 ug via INTRAVENOUS

## 2021-03-08 MED ORDER — PROPOFOL 500 MG/50ML IV EMUL
INTRAVENOUS | Status: DC | PRN
  Administered 2021-03-08: 75 ug/kg/min via INTRAVENOUS

## 2021-03-08 MED ORDER — ACETAMINOPHEN 500 MG PO TABS
1000.0000 mg | ORAL_TABLET | Freq: Once | ORAL | Status: AC
  Administered 2021-03-08: 1000 mg via ORAL

## 2021-03-08 MED ORDER — PROPOFOL 10 MG/ML IV BOLUS
INTRAVENOUS | Status: AC
  Filled 2021-03-08: qty 20

## 2021-03-08 MED ORDER — ACETAMINOPHEN 500 MG PO TABS
ORAL_TABLET | ORAL | Status: AC
  Filled 2021-03-08: qty 2

## 2021-03-08 MED ORDER — LIDOCAINE HCL 1 % IJ SOLN
INTRAMUSCULAR | Status: DC | PRN
Start: 2021-03-08 — End: 2021-03-08
  Administered 2021-03-08: 10 mL

## 2021-03-08 MED ORDER — MIDAZOLAM HCL 2 MG/2ML IJ SOLN
INTRAMUSCULAR | Status: AC
  Filled 2021-03-08: qty 2

## 2021-03-08 SURGICAL SUPPLY — 19 items
COVER BACK TABLE 60X90IN (DRAPES) ×2 IMPLANT
DRSG TEGADERM 4X4.75 (GAUZE/BANDAGES/DRESSINGS) ×3 IMPLANT
DRSG TEGADERM 8X12 (GAUZE/BANDAGES/DRESSINGS) ×4 IMPLANT
GAUZE SPONGE 4X4 12PLY STRL LF (GAUZE/BANDAGES/DRESSINGS) ×1 IMPLANT
GLOVE SURG ENC MOIS LTX SZ7.5 (GLOVE) ×2 IMPLANT
GLOVE SURG LTX SZ8 (GLOVE) ×2 IMPLANT
GLOVE SURG ORTHO LTX SZ8.5 (GLOVE) ×2 IMPLANT
GOWN STRL REUS W/TWL XL LVL3 (GOWN DISPOSABLE) ×2 IMPLANT
IMPL SPACEOAR VUE SYSTEM (Spacer) ×1 IMPLANT
IMPLANT SPACEOAR VUE SYSTEM (Spacer) ×2 IMPLANT
KIT TURNOVER CYSTO (KITS) ×2 IMPLANT
MARKER GOLD PRELOAD 1.2X3 (Urological Implant) ×1 IMPLANT
NDL SPNL 22GX7 QUINCKE BK (NEEDLE) IMPLANT
NEEDLE SPNL 22GX7 QUINCKE BK (NEEDLE) ×2 IMPLANT
PACK CYSTO (CUSTOM PROCEDURE TRAY) ×2 IMPLANT
SEED GOLD PRELOAD 1.2X3 (Urological Implant) ×2 IMPLANT
SURGILUBE 2OZ TUBE FLIPTOP (MISCELLANEOUS) ×2 IMPLANT
SYR 20ML LL LF (SYRINGE) IMPLANT
UNDERPAD 30X36 HEAVY ABSORB (UNDERPADS AND DIAPERS) ×4 IMPLANT

## 2021-03-08 NOTE — Discharge Instructions (Addendum)

## 2021-03-08 NOTE — Anesthesia Preprocedure Evaluation (Addendum)
Anesthesia Evaluation  ?Patient identified by MRN, date of birth, ID band ?Patient awake ? ? ? ?Reviewed: ?Allergy & Precautions, H&P , NPO status , Patient's Chart, lab work & pertinent test results ? ?Airway ?Mallampati: II ? ?TM Distance: >3 FB ?Neck ROM: Full ? ? ? Dental ?no notable dental hx. ?(+) Edentulous Upper, Edentulous Lower, Dental Advisory Given ?  ?Pulmonary ?COPD,  COPD inhaler, Current SmokerPatient did not abstain from smoking.,  ?  ?Pulmonary exam normal ?breath sounds clear to auscultation ? ? ? ? ? ? Cardiovascular ?hypertension, Pt. on medications ?+ Past MI, + Peripheral Vascular Disease and +CHF  ?+ dysrhythmias  ?Rhythm:Regular Rate:Normal ? ? ?  ?Neuro/Psych ?negative neurological ROS ? negative psych ROS  ? GI/Hepatic ?Neg liver ROS, GERD  Medicated,  ?Endo/Other  ?negative endocrine ROS ? Renal/GU ?Renal disease  ?negative genitourinary ?  ?Musculoskeletal ? ? Abdominal ?  ?Peds ? Hematology ?negative hematology ROS ?(+)   ?Anesthesia Other Findings ? ? Reproductive/Obstetrics ?negative OB ROS ? ?  ? ? ? ? ? ? ? ? ? ? ? ? ? ?  ?  ? ? ? ? ? ? ? ?Anesthesia Physical ?Anesthesia Plan ? ?ASA: 3 ? ?Anesthesia Plan: MAC  ? ?Post-op Pain Management: Tylenol PO (pre-op)*  ? ?Induction: Intravenous ? ?PONV Risk Score and Plan: 1 and Propofol infusion and Ondansetron ? ?Airway Management Planned: Natural Airway and Simple Face Mask ? ?Additional Equipment:  ? ?Intra-op Plan:  ? ?Post-operative Plan:  ? ?Informed Consent: I have reviewed the patients History and Physical, chart, labs and discussed the procedure including the risks, benefits and alternatives for the proposed anesthesia with the patient or authorized representative who has indicated his/her understanding and acceptance.  ? ? ? ?Dental advisory given ? ?Plan Discussed with: CRNA ? ?Anesthesia Plan Comments:   ? ? ? ? ? ? ?Anesthesia Quick Evaluation ? ?

## 2021-03-08 NOTE — Anesthesia Postprocedure Evaluation (Signed)
Anesthesia Post Note ? ?Patient: Nidal Rivet ? ?Procedure(s) Performed: GOLD SEED IMPLANT ?SPACE OAR INSTILLATION ? ?  ? ?Patient location during evaluation: PACU ?Anesthesia Type: MAC ?Level of consciousness: awake and alert ?Pain management: pain level controlled ?Vital Signs Assessment: post-procedure vital signs reviewed and stable ?Respiratory status: spontaneous breathing, nonlabored ventilation and respiratory function stable ?Cardiovascular status: stable and blood pressure returned to baseline ?Postop Assessment: no apparent nausea or vomiting ?Anesthetic complications: no ? ? ?No notable events documented. ? ?Last Vitals:  ?Vitals:  ? 03/08/21 0915 03/08/21 0938  ?BP: 110/66 109/76  ?Pulse: (!) 51 78  ?Resp: 13 16  ?Temp: (!) (P) 36.3 ?C   ?SpO2: 99% 99%  ?  ?Last Pain:  ?Vitals:  ? 03/08/21 0938  ?TempSrc:   ?PainSc: 0-No pain  ? ? ?  ?  ?  ?  ?  ?  ? ?Danilynn Jemison,W. EDMOND ? ? ? ? ?

## 2021-03-08 NOTE — Op Note (Addendum)
Preoperative diagnosis: Clinically localized adenocarcinoma of the prostate (T1) ? ?Postoperative diagnosis: Clinically localized adenocarcinoma of the prostate ? ?Procedure: 1) Placement of fiducial markers into prostate ?                   2) Insertion of SpaceOAR hydrogel  ? ?Surgeon: Milford Cage ? ?Radiation oncologist: Tammi Klippel ? ?Anesthesia: MAC ? ?EBL: Minimal ? ?Complications: None ? ?Indication: Andres Boyd is a 66 y.o. gentleman with clinically localized prostate cancer. After discussing management options for treatment, he elected to proceed with radiotherapy. He presents today for the above procedures. The potential risks, complications, alternative options, and expected recovery course have been discussed in detail with the patient and he has provided informed consent to proceed. ? ?Description of procedure: The patient was administered preoperative antibiotics, placed in the dorsal lithotomy position, and prepped and draped in the usual sterile fashion. Next, transrectal ultrasonography was utilized to visualize the prostate. Three gold fiducial markers were then placed into the prostate via transperineal needles under ultrasound guidance at the rightapex, right base, and left mid gland under direct ultrasound guidance. A site in the midline was then selected on the perineum for placement of an 18 g needle with saline. The needle was advanced above the rectum and below Denonvillier's fascia to the mid gland and confirmed to be in the midline on transverse imaging. One cc of saline was injected confirming appropriate expansion of this space. A total of 5 cc of saline was then injected to open the space further bilaterally. The saline syringe was then removed and the SpaceOAR hydrogel was injected with good distribution bilaterally. He tolerated the procedure well and without complications. He was given a voiding trial prior to discharge from the PACU.  ?

## 2021-03-08 NOTE — Interval H&P Note (Signed)
History and Physical Interval Note: ? ?03/08/2021 ?7:59 AM ? ?Andres Boyd  has presented today for surgery, with the diagnosis of PROSTATE CANCER.  The various methods of treatment have been discussed with the patient and family. After consideration of risks, benefits and other options for treatment, the patient has consented to  Procedure(s) with comments: ?GOLD SEED IMPLANT (N/A) - 30 MINS FOR CASE ?SPACE OAR INSTILLATION (N/A) as a surgical intervention.  The patient's history has been reviewed, patient examined, no change in status, stable for surgery.  I have reviewed the patient's chart and labs.  Questions were answered to the patient's satisfaction.   ? ? ?Remi Haggard ? ? ?

## 2021-03-08 NOTE — Transfer of Care (Signed)
Immediate Anesthesia Transfer of Care Note ? ?Patient: Andres Boyd ? ?Procedure(s) Performed: GOLD SEED IMPLANT ?SPACE OAR INSTILLATION ? ?Patient Location: PACU ? ?Anesthesia Type:MAC ? ?Level of Consciousness: drowsy and patient cooperative ? ?Airway & Oxygen Therapy: Patient Spontanous Breathing ? ?Post-op Assessment: Report given to RN and Post -op Vital signs reviewed and stable ? ?Post vital signs: Reviewed and stable ? ?Last Vitals:  ?Vitals Value Taken Time  ?BP 89/68 03/08/21 0850  ?Temp 36.3 ?C 03/08/21 0850  ?Pulse 64 03/08/21 0851  ?Resp 14 03/08/21 0851  ?SpO2 97 % 03/08/21 0851  ?Vitals shown include unvalidated device data. ? ?Last Pain:  ?Vitals:  ? 03/08/21 0708  ?TempSrc: Oral  ?PainSc: 0-No pain  ?   ? ?Patients Stated Pain Goal: 3 (03/08/21 0708) ? ?Complications: No notable events documented. ?

## 2021-03-09 ENCOUNTER — Encounter (HOSPITAL_BASED_OUTPATIENT_CLINIC_OR_DEPARTMENT_OTHER): Payer: Self-pay | Admitting: Urology

## 2021-03-10 ENCOUNTER — Telehealth: Payer: Self-pay | Admitting: *Deleted

## 2021-03-10 NOTE — Telephone Encounter (Signed)
CALLED PATIENT TO REMIND OF SIM APPT. FOR 03-14-21- ARRIVAL TIME- 9:45 AM @ [facility], PATIENT INFORMED TO ARRIVE WITH A FULL BLADDER AND AN EMPTY BOWEL, SPOKE WITH PATIENT AND HE IS AWARE OF THIS APPT. ?

## 2021-03-13 NOTE — Progress Notes (Signed)
?  Radiation Oncology         [phone] ?________________________________ ? ?Name: Andres Boyd MRN: [id number]  ?Date: 03/14/2021  DOB: Sep 24, 1955 ? ?SIMULATION AND TREATMENT PLANNING NOTE ? ?  ICD-10-CM   ?1. Malignant neoplasm of prostate ([place])  C61   ?  ? ? ?DIAGNOSIS:  66 y.o. gentleman with Stage T2c adenocarcinoma of the prostate with Gleason score of 4+3, and PSA of 40.70. ? ?NARRATIVE:  The patient was brought to the [facility].  Identity was confirmed.  All relevant records and images related to the planned course of therapy were reviewed.  The patient freely provided informed written consent to proceed with treatment after reviewing the details related to the planned course of therapy. The consent form was witnessed and verified by the simulation staff.  Then, the patient was set-up in a stable reproducible supine position for radiation therapy.  A vacuum lock pillow device was custom fabricated to position his legs in a reproducible immobilized position.  Then, I performed a urethrogram under sterile conditions to identify the prostatic bed.  CT images were obtained.  Surface markings were placed.  The CT images were loaded into the planning software.  Then the prostate bed target, pelvic lymph node target and avoidance structures including the rectum, bladder, bowel and hips were contoured.  Treatment planning then occurred.  The radiation prescription was entered and confirmed.  A total of one complex treatment devices were fabricated. I have requested : Intensity Modulated Radiotherapy (IMRT) is medically necessary for this case for the following reason:  Rectal sparing.. ? ?PLAN:  The patient will receive 45 Gy in 25 fractions of 1.8 Gy, followed by a boost to the prostate to a total dose of 75 Gy with 15 additional fractions of 2 Gy. ? ? ?________________________________ ? ?Sheral Apley Tammi Klippel, M.D. ? ?

## 2021-03-14 ENCOUNTER — Other Ambulatory Visit: Payer: Self-pay

## 2021-03-14 ENCOUNTER — Ambulatory Visit
Admission: RE | Admit: 2021-03-14 | Discharge: 2021-03-14 | Disposition: A | Discharge status: Home / Self Care | Payer: 59 | Source: Ambulatory Visit | Attending: Radiation Oncology | Admitting: Radiation Oncology

## 2021-03-14 DIAGNOSIS — C61 Malignant neoplasm of prostate: Secondary | ICD-10-CM | POA: Diagnosis present

## 2021-03-14 DIAGNOSIS — Z510 Encounter for antineoplastic radiation therapy: Secondary | ICD-10-CM | POA: Insufficient documentation

## 2021-03-21 DIAGNOSIS — Z510 Encounter for antineoplastic radiation therapy: Secondary | ICD-10-CM | POA: Diagnosis not present

## 2021-03-23 ENCOUNTER — Ambulatory Visit
Admission: RE | Admit: 2021-03-23 | Discharge: 2021-03-23 | Disposition: A | Discharge status: Home / Self Care | Payer: 59 | Source: Ambulatory Visit | Attending: Radiation Oncology | Admitting: Radiation Oncology

## 2021-03-23 ENCOUNTER — Inpatient Hospital Stay: Payer: 59 | Attending: Radiation Oncology

## 2021-03-23 ENCOUNTER — Other Ambulatory Visit: Payer: Self-pay

## 2021-03-23 DIAGNOSIS — Z510 Encounter for antineoplastic radiation therapy: Secondary | ICD-10-CM | POA: Diagnosis not present

## 2021-03-24 ENCOUNTER — Inpatient Hospital Stay: Payer: 59

## 2021-03-24 ENCOUNTER — Ambulatory Visit
Admission: RE | Admit: 2021-03-24 | Discharge: 2021-03-24 | Disposition: A | Discharge status: Home / Self Care | Payer: 59 | Source: Ambulatory Visit | Attending: Radiation Oncology | Admitting: Radiation Oncology

## 2021-03-24 DIAGNOSIS — Z510 Encounter for antineoplastic radiation therapy: Secondary | ICD-10-CM | POA: Diagnosis not present

## 2021-03-25 ENCOUNTER — Inpatient Hospital Stay: Payer: 59

## 2021-03-25 ENCOUNTER — Other Ambulatory Visit: Payer: Self-pay

## 2021-03-25 ENCOUNTER — Ambulatory Visit
Admission: RE | Admit: 2021-03-25 | Discharge: 2021-03-25 | Disposition: A | Discharge status: Home / Self Care | Payer: 59 | Source: Ambulatory Visit | Attending: Radiation Oncology | Admitting: Radiation Oncology

## 2021-03-25 DIAGNOSIS — Z510 Encounter for antineoplastic radiation therapy: Secondary | ICD-10-CM | POA: Diagnosis not present

## 2021-03-28 ENCOUNTER — Ambulatory Visit
Admission: RE | Admit: 2021-03-28 | Discharge: 2021-03-28 | Disposition: A | Discharge status: Home / Self Care | Payer: 59 | Source: Ambulatory Visit | Attending: Radiation Oncology | Admitting: Radiation Oncology

## 2021-03-28 ENCOUNTER — Other Ambulatory Visit: Payer: Self-pay

## 2021-03-28 DIAGNOSIS — Z510 Encounter for antineoplastic radiation therapy: Secondary | ICD-10-CM | POA: Diagnosis not present

## 2021-03-29 ENCOUNTER — Inpatient Hospital Stay: Payer: 59

## 2021-03-29 ENCOUNTER — Ambulatory Visit
Admission: RE | Admit: 2021-03-29 | Discharge: 2021-03-29 | Disposition: A | Discharge status: Home / Self Care | Payer: 59 | Source: Ambulatory Visit | Attending: Radiation Oncology | Admitting: Radiation Oncology

## 2021-03-29 DIAGNOSIS — Z510 Encounter for antineoplastic radiation therapy: Secondary | ICD-10-CM | POA: Diagnosis not present

## 2021-03-30 ENCOUNTER — Ambulatory Visit
Admission: RE | Admit: 2021-03-30 | Discharge: 2021-03-30 | Disposition: A | Discharge status: Home / Self Care | Payer: 59 | Source: Ambulatory Visit | Attending: Radiation Oncology | Admitting: Radiation Oncology

## 2021-03-30 ENCOUNTER — Inpatient Hospital Stay: Payer: 59

## 2021-03-30 ENCOUNTER — Other Ambulatory Visit: Payer: Self-pay

## 2021-03-30 DIAGNOSIS — Z510 Encounter for antineoplastic radiation therapy: Secondary | ICD-10-CM | POA: Diagnosis not present

## 2021-03-31 ENCOUNTER — Ambulatory Visit
Admission: RE | Admit: 2021-03-31 | Discharge: 2021-03-31 | Disposition: A | Discharge status: Home / Self Care | Payer: 59 | Source: Ambulatory Visit | Attending: Radiation Oncology | Admitting: Radiation Oncology

## 2021-03-31 ENCOUNTER — Inpatient Hospital Stay: Payer: 59

## 2021-03-31 DIAGNOSIS — Z510 Encounter for antineoplastic radiation therapy: Secondary | ICD-10-CM | POA: Diagnosis not present

## 2021-04-01 ENCOUNTER — Other Ambulatory Visit: Payer: Self-pay

## 2021-04-01 ENCOUNTER — Inpatient Hospital Stay: Payer: 59

## 2021-04-01 ENCOUNTER — Ambulatory Visit
Admission: RE | Admit: 2021-04-01 | Discharge: 2021-04-01 | Disposition: A | Discharge status: Home / Self Care | Payer: 59 | Source: Ambulatory Visit | Attending: Radiation Oncology | Admitting: Radiation Oncology

## 2021-04-01 DIAGNOSIS — Z510 Encounter for antineoplastic radiation therapy: Secondary | ICD-10-CM | POA: Diagnosis not present

## 2021-04-04 ENCOUNTER — Inpatient Hospital Stay: Payer: 59 | Attending: Radiation Oncology

## 2021-04-04 ENCOUNTER — Other Ambulatory Visit: Payer: Self-pay

## 2021-04-04 ENCOUNTER — Ambulatory Visit
Admission: RE | Admit: 2021-04-04 | Discharge: 2021-04-04 | Disposition: A | Discharge status: Home / Self Care | Payer: 59 | Source: Ambulatory Visit | Attending: Radiation Oncology | Admitting: Radiation Oncology

## 2021-04-04 DIAGNOSIS — Z510 Encounter for antineoplastic radiation therapy: Secondary | ICD-10-CM | POA: Diagnosis not present

## 2021-04-04 DIAGNOSIS — C61 Malignant neoplasm of prostate: Secondary | ICD-10-CM | POA: Insufficient documentation

## 2021-04-05 ENCOUNTER — Inpatient Hospital Stay: Payer: 59

## 2021-04-05 ENCOUNTER — Ambulatory Visit
Admission: RE | Admit: 2021-04-05 | Discharge: 2021-04-05 | Disposition: A | Discharge status: Home / Self Care | Payer: 59 | Source: Ambulatory Visit | Attending: Radiation Oncology | Admitting: Radiation Oncology

## 2021-04-05 DIAGNOSIS — Z510 Encounter for antineoplastic radiation therapy: Secondary | ICD-10-CM | POA: Diagnosis not present

## 2021-04-06 ENCOUNTER — Inpatient Hospital Stay: Payer: 59

## 2021-04-06 ENCOUNTER — Other Ambulatory Visit: Payer: Self-pay

## 2021-04-06 ENCOUNTER — Ambulatory Visit
Admission: RE | Admit: 2021-04-06 | Discharge: 2021-04-06 | Disposition: A | Discharge status: Home / Self Care | Payer: 59 | Source: Ambulatory Visit | Attending: Radiation Oncology | Admitting: Radiation Oncology

## 2021-04-06 DIAGNOSIS — Z510 Encounter for antineoplastic radiation therapy: Secondary | ICD-10-CM | POA: Diagnosis not present

## 2021-04-07 ENCOUNTER — Ambulatory Visit
Admission: RE | Admit: 2021-04-07 | Discharge: 2021-04-07 | Disposition: A | Discharge status: Home / Self Care | Payer: 59 | Source: Ambulatory Visit | Attending: Radiation Oncology | Admitting: Radiation Oncology

## 2021-04-07 ENCOUNTER — Inpatient Hospital Stay: Payer: 59

## 2021-04-07 DIAGNOSIS — Z510 Encounter for antineoplastic radiation therapy: Secondary | ICD-10-CM | POA: Diagnosis not present

## 2021-04-08 ENCOUNTER — Ambulatory Visit
Admission: RE | Admit: 2021-04-08 | Discharge: 2021-04-08 | Disposition: A | Discharge status: Home / Self Care | Payer: 59 | Source: Ambulatory Visit | Attending: Radiation Oncology | Admitting: Radiation Oncology

## 2021-04-08 ENCOUNTER — Other Ambulatory Visit: Payer: Self-pay

## 2021-04-08 ENCOUNTER — Inpatient Hospital Stay: Payer: 59

## 2021-04-08 DIAGNOSIS — Z510 Encounter for antineoplastic radiation therapy: Secondary | ICD-10-CM | POA: Diagnosis not present

## 2021-04-11 ENCOUNTER — Other Ambulatory Visit: Payer: Self-pay

## 2021-04-11 ENCOUNTER — Ambulatory Visit
Admission: RE | Admit: 2021-04-11 | Discharge: 2021-04-11 | Disposition: A | Discharge status: Home / Self Care | Payer: 59 | Source: Ambulatory Visit | Attending: Radiation Oncology | Admitting: Radiation Oncology

## 2021-04-11 ENCOUNTER — Inpatient Hospital Stay: Payer: 59

## 2021-04-11 DIAGNOSIS — Z510 Encounter for antineoplastic radiation therapy: Secondary | ICD-10-CM | POA: Diagnosis not present

## 2021-04-12 ENCOUNTER — Ambulatory Visit
Admission: RE | Admit: 2021-04-12 | Discharge: 2021-04-12 | Disposition: A | Discharge status: Home / Self Care | Payer: 59 | Source: Ambulatory Visit | Attending: Radiation Oncology | Admitting: Radiation Oncology

## 2021-04-12 ENCOUNTER — Inpatient Hospital Stay: Payer: 59

## 2021-04-12 DIAGNOSIS — Z510 Encounter for antineoplastic radiation therapy: Secondary | ICD-10-CM | POA: Diagnosis not present

## 2021-04-13 ENCOUNTER — Other Ambulatory Visit: Payer: Self-pay

## 2021-04-13 ENCOUNTER — Ambulatory Visit
Admission: RE | Admit: 2021-04-13 | Discharge: 2021-04-13 | Disposition: A | Discharge status: Home / Self Care | Payer: 59 | Source: Ambulatory Visit | Attending: Radiation Oncology | Admitting: Radiation Oncology

## 2021-04-13 ENCOUNTER — Inpatient Hospital Stay: Payer: 59

## 2021-04-13 DIAGNOSIS — Z510 Encounter for antineoplastic radiation therapy: Secondary | ICD-10-CM | POA: Diagnosis not present

## 2021-04-14 ENCOUNTER — Ambulatory Visit
Admission: RE | Admit: 2021-04-14 | Discharge: 2021-04-14 | Disposition: A | Discharge status: Home / Self Care | Payer: 59 | Source: Ambulatory Visit | Attending: Radiation Oncology | Admitting: Radiation Oncology

## 2021-04-14 ENCOUNTER — Inpatient Hospital Stay: Payer: 59

## 2021-04-14 DIAGNOSIS — Z510 Encounter for antineoplastic radiation therapy: Secondary | ICD-10-CM | POA: Diagnosis not present

## 2021-04-15 ENCOUNTER — Other Ambulatory Visit: Payer: Self-pay | Admitting: *Deleted

## 2021-04-15 ENCOUNTER — Other Ambulatory Visit: Payer: Self-pay

## 2021-04-15 ENCOUNTER — Ambulatory Visit
Admission: RE | Admit: 2021-04-15 | Discharge: 2021-04-15 | Disposition: A | Discharge status: Home / Self Care | Payer: 59 | Source: Ambulatory Visit | Attending: Radiation Oncology | Admitting: Radiation Oncology

## 2021-04-15 ENCOUNTER — Inpatient Hospital Stay: Payer: 59

## 2021-04-15 DIAGNOSIS — Z510 Encounter for antineoplastic radiation therapy: Secondary | ICD-10-CM | POA: Diagnosis not present

## 2021-04-15 MED ORDER — CARVEDILOL 3.125 MG PO TABS: ORAL_TABLET | ORAL | 0 refills | Status: DC

## 2021-04-15 NOTE — Addendum Note (Signed)
Addended by: Carter Kitten D on: 04/15/2021 07:34 AM ? ? Modules accepted: Orders ? ?

## 2021-04-18 ENCOUNTER — Other Ambulatory Visit: Payer: Self-pay

## 2021-04-18 ENCOUNTER — Inpatient Hospital Stay: Payer: 59

## 2021-04-18 ENCOUNTER — Ambulatory Visit
Admission: RE | Admit: 2021-04-18 | Discharge: 2021-04-18 | Disposition: A | Discharge status: Home / Self Care | Payer: 59 | Source: Ambulatory Visit | Attending: Radiation Oncology | Admitting: Radiation Oncology

## 2021-04-18 DIAGNOSIS — Z510 Encounter for antineoplastic radiation therapy: Secondary | ICD-10-CM | POA: Diagnosis not present

## 2021-04-19 ENCOUNTER — Other Ambulatory Visit: Payer: Self-pay

## 2021-04-19 ENCOUNTER — Inpatient Hospital Stay: Payer: 59

## 2021-04-19 ENCOUNTER — Ambulatory Visit
Admission: RE | Admit: 2021-04-19 | Discharge: 2021-04-19 | Disposition: A | Discharge status: Home / Self Care | Payer: 59 | Source: Ambulatory Visit | Attending: Radiation Oncology | Admitting: Radiation Oncology

## 2021-04-19 DIAGNOSIS — E782 Mixed hyperlipidemia: Secondary | ICD-10-CM

## 2021-04-19 DIAGNOSIS — Z510 Encounter for antineoplastic radiation therapy: Secondary | ICD-10-CM | POA: Diagnosis not present

## 2021-04-19 DIAGNOSIS — I214 Non-ST elevation (NSTEMI) myocardial infarction: Secondary | ICD-10-CM

## 2021-04-19 DIAGNOSIS — I5022 Chronic systolic (congestive) heart failure: Secondary | ICD-10-CM

## 2021-04-19 DIAGNOSIS — I2510 Atherosclerotic heart disease of native coronary artery without angina pectoris: Secondary | ICD-10-CM

## 2021-04-19 LAB — RAD ONC ARIA SESSION SUMMARY
Course Elapsed Days: 27
Plan Fractions Treated to Date: 20
Plan Prescribed Dose Per Fraction: 1.8 Gy
Plan Total Fractions Prescribed: 25
Plan Total Prescribed Dose: 45 Gy
Reference Point Dosage Given to Date: 36 Gy
Reference Point Session Dosage Given: 1.8 Gy
Session Number: 20

## 2021-04-19 MED ORDER — ATORVASTATIN CALCIUM 20 MG PO TABS: 20.0000 mg | ORAL_TABLET | Freq: Every day | ORAL | 0 refills | Status: DC

## 2021-04-20 ENCOUNTER — Other Ambulatory Visit: Payer: Self-pay

## 2021-04-20 ENCOUNTER — Ambulatory Visit: Payer: 59

## 2021-04-20 ENCOUNTER — Inpatient Hospital Stay: Payer: 59

## 2021-04-21 ENCOUNTER — Other Ambulatory Visit: Payer: Self-pay

## 2021-04-21 ENCOUNTER — Ambulatory Visit
Admission: RE | Admit: 2021-04-21 | Discharge: 2021-04-21 | Disposition: A | Discharge status: Home / Self Care | Payer: 59 | Source: Ambulatory Visit | Attending: Radiation Oncology | Admitting: Radiation Oncology

## 2021-04-21 ENCOUNTER — Inpatient Hospital Stay: Payer: 59

## 2021-04-21 DIAGNOSIS — Z510 Encounter for antineoplastic radiation therapy: Secondary | ICD-10-CM | POA: Diagnosis not present

## 2021-04-21 LAB — RAD ONC ARIA SESSION SUMMARY
Course Elapsed Days: 29
Plan Fractions Treated to Date: 21
Plan Prescribed Dose Per Fraction: 1.8 Gy
Plan Total Fractions Prescribed: 25
Plan Total Prescribed Dose: 45 Gy
Reference Point Dosage Given to Date: 37.8 Gy
Reference Point Session Dosage Given: 1.8 Gy
Session Number: 21

## 2021-04-22 ENCOUNTER — Ambulatory Visit
Admission: RE | Admit: 2021-04-22 | Discharge: 2021-04-22 | Disposition: A | Discharge status: Home / Self Care | Payer: 59 | Source: Ambulatory Visit | Attending: Radiation Oncology | Admitting: Radiation Oncology

## 2021-04-22 ENCOUNTER — Other Ambulatory Visit: Payer: Self-pay

## 2021-04-22 ENCOUNTER — Inpatient Hospital Stay: Payer: 59

## 2021-04-22 DIAGNOSIS — Z510 Encounter for antineoplastic radiation therapy: Secondary | ICD-10-CM | POA: Diagnosis not present

## 2021-04-22 LAB — RAD ONC ARIA SESSION SUMMARY
Course Elapsed Days: 30
Plan Fractions Treated to Date: 22
Plan Prescribed Dose Per Fraction: 1.8 Gy
Plan Total Fractions Prescribed: 25
Plan Total Prescribed Dose: 45 Gy
Reference Point Dosage Given to Date: 39.6 Gy
Reference Point Session Dosage Given: 1.8 Gy
Session Number: 22

## 2021-04-25 ENCOUNTER — Inpatient Hospital Stay: Payer: 59

## 2021-04-25 ENCOUNTER — Other Ambulatory Visit: Payer: Self-pay

## 2021-04-25 ENCOUNTER — Ambulatory Visit
Admission: RE | Admit: 2021-04-25 | Discharge: 2021-04-25 | Disposition: A | Discharge status: Home / Self Care | Payer: 59 | Source: Ambulatory Visit | Attending: Radiation Oncology | Admitting: Radiation Oncology

## 2021-04-25 ENCOUNTER — Other Ambulatory Visit: Payer: Self-pay | Admitting: *Deleted

## 2021-04-25 DIAGNOSIS — Z510 Encounter for antineoplastic radiation therapy: Secondary | ICD-10-CM | POA: Diagnosis not present

## 2021-04-25 LAB — RAD ONC ARIA SESSION SUMMARY
Course Elapsed Days: 33
Plan Fractions Treated to Date: 23
Plan Prescribed Dose Per Fraction: 1.8 Gy
Plan Total Fractions Prescribed: 25
Plan Total Prescribed Dose: 45 Gy
Reference Point Dosage Given to Date: 41.4 Gy
Reference Point Session Dosage Given: 1.8 Gy
Session Number: 23

## 2021-04-25 MED ORDER — PANTOPRAZOLE SODIUM 40 MG PO TBEC: 40.0000 mg | DELAYED_RELEASE_TABLET | Freq: Every day | ORAL | 0 refills | Status: DC

## 2021-04-26 ENCOUNTER — Other Ambulatory Visit: Payer: Self-pay

## 2021-04-26 ENCOUNTER — Ambulatory Visit
Admission: RE | Admit: 2021-04-26 | Discharge: 2021-04-26 | Disposition: A | Discharge status: Home / Self Care | Payer: 59 | Source: Ambulatory Visit | Attending: Radiation Oncology | Admitting: Radiation Oncology

## 2021-04-26 ENCOUNTER — Inpatient Hospital Stay: Payer: 59

## 2021-04-26 DIAGNOSIS — Z510 Encounter for antineoplastic radiation therapy: Secondary | ICD-10-CM | POA: Diagnosis not present

## 2021-04-26 LAB — RAD ONC ARIA SESSION SUMMARY
Course Elapsed Days: 34
Plan Fractions Treated to Date: 24
Plan Prescribed Dose Per Fraction: 1.8 Gy
Plan Total Fractions Prescribed: 25
Plan Total Prescribed Dose: 45 Gy
Reference Point Dosage Given to Date: 43.2 Gy
Reference Point Session Dosage Given: 1.8 Gy
Session Number: 24

## 2021-04-27 ENCOUNTER — Other Ambulatory Visit: Payer: Self-pay

## 2021-04-27 ENCOUNTER — Inpatient Hospital Stay: Payer: 59

## 2021-04-27 ENCOUNTER — Ambulatory Visit: Payer: 59

## 2021-04-27 DIAGNOSIS — Z510 Encounter for antineoplastic radiation therapy: Secondary | ICD-10-CM | POA: Diagnosis not present

## 2021-04-27 LAB — RAD ONC ARIA SESSION SUMMARY
Course Elapsed Days: 35
Plan Fractions Treated to Date: 25
Plan Prescribed Dose Per Fraction: 1.8 Gy
Plan Total Fractions Prescribed: 25
Plan Total Prescribed Dose: 45 Gy
Reference Point Dosage Given to Date: 45 Gy
Reference Point Session Dosage Given: 1.8 Gy
Session Number: 25

## 2021-04-28 ENCOUNTER — Ambulatory Visit: Payer: 59

## 2021-04-28 ENCOUNTER — Inpatient Hospital Stay: Payer: 59

## 2021-04-28 ENCOUNTER — Other Ambulatory Visit: Payer: Self-pay

## 2021-04-28 DIAGNOSIS — Z510 Encounter for antineoplastic radiation therapy: Secondary | ICD-10-CM | POA: Diagnosis not present

## 2021-04-28 LAB — RAD ONC ARIA SESSION SUMMARY
Course Elapsed Days: 36
Plan Fractions Treated to Date: 1
Plan Prescribed Dose Per Fraction: 2 Gy
Plan Total Fractions Prescribed: 15
Plan Total Prescribed Dose: 30 Gy
Reference Point Dosage Given to Date: 47 Gy
Reference Point Session Dosage Given: 2 Gy
Session Number: 26

## 2021-04-29 ENCOUNTER — Other Ambulatory Visit: Payer: Self-pay

## 2021-04-29 ENCOUNTER — Ambulatory Visit
Admission: RE | Admit: 2021-04-29 | Discharge: 2021-04-29 | Disposition: A | Discharge status: Home / Self Care | Payer: 59 | Source: Ambulatory Visit | Attending: Radiation Oncology | Admitting: Radiation Oncology

## 2021-04-29 ENCOUNTER — Inpatient Hospital Stay: Payer: 59

## 2021-04-29 DIAGNOSIS — Z510 Encounter for antineoplastic radiation therapy: Secondary | ICD-10-CM | POA: Diagnosis not present

## 2021-04-29 LAB — RAD ONC ARIA SESSION SUMMARY
Course Elapsed Days: 37
Plan Fractions Treated to Date: 2
Plan Prescribed Dose Per Fraction: 2 Gy
Plan Total Fractions Prescribed: 15
Plan Total Prescribed Dose: 30 Gy
Reference Point Dosage Given to Date: 49 Gy
Reference Point Session Dosage Given: 2 Gy
Session Number: 27

## 2021-05-02 ENCOUNTER — Ambulatory Visit
Admission: RE | Admit: 2021-05-02 | Discharge: 2021-05-02 | Disposition: A | Discharge status: Home / Self Care | Payer: 59 | Source: Ambulatory Visit | Attending: Radiation Oncology | Admitting: Radiation Oncology

## 2021-05-02 ENCOUNTER — Inpatient Hospital Stay: Payer: 59 | Attending: Radiation Oncology

## 2021-05-02 ENCOUNTER — Other Ambulatory Visit: Payer: Self-pay

## 2021-05-02 DIAGNOSIS — C61 Malignant neoplasm of prostate: Secondary | ICD-10-CM | POA: Insufficient documentation

## 2021-05-02 DIAGNOSIS — Z510 Encounter for antineoplastic radiation therapy: Secondary | ICD-10-CM | POA: Diagnosis present

## 2021-05-02 LAB — RAD ONC ARIA SESSION SUMMARY
Course Elapsed Days: 40
Plan Fractions Treated to Date: 3
Plan Prescribed Dose Per Fraction: 2 Gy
Plan Total Fractions Prescribed: 15
Plan Total Prescribed Dose: 30 Gy
Reference Point Dosage Given to Date: 51 Gy
Reference Point Session Dosage Given: 2 Gy
Session Number: 28

## 2021-05-03 ENCOUNTER — Ambulatory Visit
Admission: RE | Admit: 2021-05-03 | Discharge: 2021-05-03 | Disposition: A | Discharge status: Home / Self Care | Payer: 59 | Source: Ambulatory Visit | Attending: Radiation Oncology | Admitting: Radiation Oncology

## 2021-05-03 ENCOUNTER — Inpatient Hospital Stay: Payer: 59

## 2021-05-03 ENCOUNTER — Other Ambulatory Visit: Payer: Self-pay

## 2021-05-03 DIAGNOSIS — Z510 Encounter for antineoplastic radiation therapy: Secondary | ICD-10-CM | POA: Diagnosis not present

## 2021-05-03 LAB — RAD ONC ARIA SESSION SUMMARY
Course Elapsed Days: 41
Plan Fractions Treated to Date: 4
Plan Prescribed Dose Per Fraction: 2 Gy
Plan Total Fractions Prescribed: 15
Plan Total Prescribed Dose: 30 Gy
Reference Point Dosage Given to Date: 53 Gy
Reference Point Session Dosage Given: 2 Gy
Session Number: 29

## 2021-05-04 ENCOUNTER — Ambulatory Visit
Admission: RE | Admit: 2021-05-04 | Discharge: 2021-05-04 | Disposition: A | Discharge status: Home / Self Care | Payer: 59 | Source: Ambulatory Visit | Attending: Radiation Oncology | Admitting: Radiation Oncology

## 2021-05-04 ENCOUNTER — Other Ambulatory Visit: Payer: Self-pay

## 2021-05-04 ENCOUNTER — Inpatient Hospital Stay: Payer: 59

## 2021-05-04 DIAGNOSIS — Z510 Encounter for antineoplastic radiation therapy: Secondary | ICD-10-CM | POA: Diagnosis not present

## 2021-05-04 LAB — RAD ONC ARIA SESSION SUMMARY
Course Elapsed Days: 42
Plan Fractions Treated to Date: 5
Plan Prescribed Dose Per Fraction: 2 Gy
Plan Total Fractions Prescribed: 15
Plan Total Prescribed Dose: 30 Gy
Reference Point Dosage Given to Date: 55 Gy
Reference Point Session Dosage Given: 2 Gy
Session Number: 30

## 2021-05-04 MED ORDER — ENTRESTO 49-51 MG PO TABS: 1.0000 | ORAL_TABLET | Freq: Two times a day (BID) | ORAL | 0 refills | Status: DC

## 2021-05-05 ENCOUNTER — Other Ambulatory Visit: Payer: Self-pay

## 2021-05-05 ENCOUNTER — Inpatient Hospital Stay: Payer: 59

## 2021-05-05 ENCOUNTER — Ambulatory Visit
Admission: RE | Admit: 2021-05-05 | Discharge: 2021-05-05 | Disposition: A | Discharge status: Home / Self Care | Payer: 59 | Source: Ambulatory Visit | Attending: Radiation Oncology | Admitting: Radiation Oncology

## 2021-05-05 DIAGNOSIS — Z510 Encounter for antineoplastic radiation therapy: Secondary | ICD-10-CM | POA: Diagnosis not present

## 2021-05-05 LAB — RAD ONC ARIA SESSION SUMMARY
Course Elapsed Days: 43
Plan Fractions Treated to Date: 6
Plan Prescribed Dose Per Fraction: 2 Gy
Plan Total Fractions Prescribed: 15
Plan Total Prescribed Dose: 30 Gy
Reference Point Dosage Given to Date: 57 Gy
Reference Point Session Dosage Given: 2 Gy
Session Number: 31

## 2021-05-06 ENCOUNTER — Inpatient Hospital Stay: Payer: 59

## 2021-05-06 ENCOUNTER — Other Ambulatory Visit: Payer: Self-pay

## 2021-05-06 ENCOUNTER — Ambulatory Visit
Admission: RE | Admit: 2021-05-06 | Discharge: 2021-05-06 | Disposition: A | Discharge status: Home / Self Care | Payer: 59 | Source: Ambulatory Visit | Attending: Radiation Oncology | Admitting: Radiation Oncology

## 2021-05-06 DIAGNOSIS — Z510 Encounter for antineoplastic radiation therapy: Secondary | ICD-10-CM | POA: Diagnosis not present

## 2021-05-06 LAB — RAD ONC ARIA SESSION SUMMARY
Course Elapsed Days: 44
Plan Fractions Treated to Date: 7
Plan Prescribed Dose Per Fraction: 2 Gy
Plan Total Fractions Prescribed: 15
Plan Total Prescribed Dose: 30 Gy
Reference Point Dosage Given to Date: 59 Gy
Reference Point Session Dosage Given: 2 Gy
Session Number: 32

## 2021-05-09 ENCOUNTER — Other Ambulatory Visit: Payer: Self-pay

## 2021-05-09 ENCOUNTER — Inpatient Hospital Stay: Payer: 59

## 2021-05-09 ENCOUNTER — Ambulatory Visit
Admission: RE | Admit: 2021-05-09 | Discharge: 2021-05-09 | Disposition: A | Discharge status: Home / Self Care | Payer: 59 | Source: Ambulatory Visit | Attending: Radiation Oncology | Admitting: Radiation Oncology

## 2021-05-09 DIAGNOSIS — Z510 Encounter for antineoplastic radiation therapy: Secondary | ICD-10-CM | POA: Diagnosis not present

## 2021-05-09 LAB — RAD ONC ARIA SESSION SUMMARY
Course Elapsed Days: 47
Plan Fractions Treated to Date: 8
Plan Prescribed Dose Per Fraction: 2 Gy
Plan Total Fractions Prescribed: 15
Plan Total Prescribed Dose: 30 Gy
Reference Point Dosage Given to Date: 61 Gy
Reference Point Session Dosage Given: 2 Gy
Session Number: 33

## 2021-05-10 ENCOUNTER — Inpatient Hospital Stay: Payer: 59

## 2021-05-10 ENCOUNTER — Other Ambulatory Visit: Payer: Self-pay

## 2021-05-10 ENCOUNTER — Ambulatory Visit
Admission: RE | Admit: 2021-05-10 | Discharge: 2021-05-10 | Disposition: A | Discharge status: Home / Self Care | Payer: 59 | Source: Ambulatory Visit | Attending: Radiation Oncology | Admitting: Radiation Oncology

## 2021-05-10 DIAGNOSIS — Z510 Encounter for antineoplastic radiation therapy: Secondary | ICD-10-CM | POA: Diagnosis not present

## 2021-05-10 LAB — RAD ONC ARIA SESSION SUMMARY
Course Elapsed Days: 48
Plan Fractions Treated to Date: 9
Plan Prescribed Dose Per Fraction: 2 Gy
Plan Total Fractions Prescribed: 15
Plan Total Prescribed Dose: 30 Gy
Reference Point Dosage Given to Date: 63 Gy
Reference Point Session Dosage Given: 2 Gy
Session Number: 34

## 2021-05-11 ENCOUNTER — Ambulatory Visit
Admission: RE | Admit: 2021-05-11 | Discharge: 2021-05-11 | Disposition: A | Discharge status: Home / Self Care | Payer: 59 | Source: Ambulatory Visit | Attending: Radiation Oncology | Admitting: Radiation Oncology

## 2021-05-11 ENCOUNTER — Other Ambulatory Visit: Payer: Self-pay

## 2021-05-11 ENCOUNTER — Inpatient Hospital Stay: Payer: 59

## 2021-05-11 DIAGNOSIS — Z510 Encounter for antineoplastic radiation therapy: Secondary | ICD-10-CM | POA: Diagnosis not present

## 2021-05-11 LAB — RAD ONC ARIA SESSION SUMMARY
Course Elapsed Days: 49
Plan Fractions Treated to Date: 10
Plan Prescribed Dose Per Fraction: 2 Gy
Plan Total Fractions Prescribed: 15
Plan Total Prescribed Dose: 30 Gy
Reference Point Dosage Given to Date: 65 Gy
Reference Point Session Dosage Given: 2 Gy
Session Number: 35

## 2021-05-12 ENCOUNTER — Ambulatory Visit
Admission: RE | Admit: 2021-05-12 | Discharge: 2021-05-12 | Disposition: A | Discharge status: Home / Self Care | Payer: 59 | Source: Ambulatory Visit | Attending: Radiation Oncology | Admitting: Radiation Oncology

## 2021-05-12 ENCOUNTER — Other Ambulatory Visit: Payer: Self-pay

## 2021-05-12 ENCOUNTER — Inpatient Hospital Stay: Payer: 59

## 2021-05-12 DIAGNOSIS — Z510 Encounter for antineoplastic radiation therapy: Secondary | ICD-10-CM | POA: Diagnosis not present

## 2021-05-12 LAB — RAD ONC ARIA SESSION SUMMARY
Course Elapsed Days: 50
Plan Fractions Treated to Date: 11
Plan Prescribed Dose Per Fraction: 2 Gy
Plan Total Fractions Prescribed: 15
Plan Total Prescribed Dose: 30 Gy
Reference Point Dosage Given to Date: 67 Gy
Reference Point Session Dosage Given: 2 Gy
Session Number: 36

## 2021-05-13 ENCOUNTER — Ambulatory Visit
Admission: RE | Admit: 2021-05-13 | Discharge: 2021-05-13 | Disposition: A | Discharge status: Home / Self Care | Payer: 59 | Source: Ambulatory Visit | Attending: Radiation Oncology | Admitting: Radiation Oncology

## 2021-05-13 ENCOUNTER — Other Ambulatory Visit: Payer: Self-pay

## 2021-05-13 ENCOUNTER — Inpatient Hospital Stay: Payer: 59

## 2021-05-13 DIAGNOSIS — Z510 Encounter for antineoplastic radiation therapy: Secondary | ICD-10-CM | POA: Diagnosis not present

## 2021-05-13 LAB — RAD ONC ARIA SESSION SUMMARY
Course Elapsed Days: 51
Plan Fractions Treated to Date: 12
Plan Prescribed Dose Per Fraction: 2 Gy
Plan Total Fractions Prescribed: 15
Plan Total Prescribed Dose: 30 Gy
Reference Point Dosage Given to Date: 69 Gy
Reference Point Session Dosage Given: 2 Gy
Session Number: 37

## 2021-05-16 ENCOUNTER — Other Ambulatory Visit: Payer: Self-pay

## 2021-05-16 ENCOUNTER — Inpatient Hospital Stay: Payer: 59

## 2021-05-16 ENCOUNTER — Ambulatory Visit
Admission: RE | Admit: 2021-05-16 | Discharge: 2021-05-16 | Disposition: A | Discharge status: Home / Self Care | Payer: 59 | Source: Ambulatory Visit | Attending: Radiation Oncology | Admitting: Radiation Oncology

## 2021-05-16 DIAGNOSIS — I2510 Atherosclerotic heart disease of native coronary artery without angina pectoris: Secondary | ICD-10-CM

## 2021-05-16 DIAGNOSIS — E782 Mixed hyperlipidemia: Secondary | ICD-10-CM

## 2021-05-16 DIAGNOSIS — I214 Non-ST elevation (NSTEMI) myocardial infarction: Secondary | ICD-10-CM

## 2021-05-16 DIAGNOSIS — I5022 Chronic systolic (congestive) heart failure: Secondary | ICD-10-CM

## 2021-05-16 DIAGNOSIS — R7989 Other specified abnormal findings of blood chemistry: Secondary | ICD-10-CM

## 2021-05-16 DIAGNOSIS — Z79899 Other long term (current) drug therapy: Secondary | ICD-10-CM

## 2021-05-16 DIAGNOSIS — Z510 Encounter for antineoplastic radiation therapy: Secondary | ICD-10-CM | POA: Diagnosis not present

## 2021-05-16 LAB — RAD ONC ARIA SESSION SUMMARY
Course Elapsed Days: 54
Plan Fractions Treated to Date: 13
Plan Prescribed Dose Per Fraction: 2 Gy
Plan Total Fractions Prescribed: 15
Plan Total Prescribed Dose: 30 Gy
Reference Point Dosage Given to Date: 71 Gy
Reference Point Session Dosage Given: 2 Gy
Session Number: 38

## 2021-05-16 MED ORDER — CARVEDILOL 3.125 MG PO TABS: ORAL_TABLET | ORAL | 0 refills | Status: DC

## 2021-05-16 MED ORDER — ATORVASTATIN CALCIUM 20 MG PO TABS: 20.0000 mg | ORAL_TABLET | Freq: Every day | ORAL | 0 refills | Status: DC

## 2021-05-16 MED ORDER — ENTRESTO 49-51 MG PO TABS: 1.0000 | ORAL_TABLET | Freq: Two times a day (BID) | ORAL | 0 refills | Status: DC

## 2021-05-16 MED ORDER — PANTOPRAZOLE SODIUM 40 MG PO TBEC: 40.0000 mg | DELAYED_RELEASE_TABLET | Freq: Every day | ORAL | 0 refills | Status: DC

## 2021-05-16 MED ORDER — SPIRONOLACTONE 25 MG PO TABS: 25.0000 mg | ORAL_TABLET | Freq: Every day | ORAL | 0 refills | Status: DC

## 2021-05-17 ENCOUNTER — Other Ambulatory Visit: Payer: Self-pay

## 2021-05-17 ENCOUNTER — Other Ambulatory Visit: Payer: Self-pay | Admitting: *Deleted

## 2021-05-17 ENCOUNTER — Ambulatory Visit: Payer: 59

## 2021-05-17 ENCOUNTER — Inpatient Hospital Stay: Payer: 59

## 2021-05-17 ENCOUNTER — Ambulatory Visit
Admission: RE | Admit: 2021-05-17 | Discharge: 2021-05-17 | Disposition: A | Discharge status: Home / Self Care | Payer: 59 | Source: Ambulatory Visit | Attending: Radiation Oncology | Admitting: Radiation Oncology

## 2021-05-17 DIAGNOSIS — Z510 Encounter for antineoplastic radiation therapy: Secondary | ICD-10-CM | POA: Diagnosis not present

## 2021-05-17 LAB — RAD ONC ARIA SESSION SUMMARY
Course Elapsed Days: 55
Plan Fractions Treated to Date: 14
Plan Prescribed Dose Per Fraction: 2 Gy
Plan Total Fractions Prescribed: 15
Plan Total Prescribed Dose: 30 Gy
Reference Point Dosage Given to Date: 73 Gy
Reference Point Session Dosage Given: 2 Gy
Session Number: 39

## 2021-05-18 ENCOUNTER — Other Ambulatory Visit: Payer: Self-pay

## 2021-05-18 ENCOUNTER — Ambulatory Visit
Admission: RE | Admit: 2021-05-18 | Discharge: 2021-05-18 | Disposition: A | Discharge status: Home / Self Care | Payer: 59 | Source: Ambulatory Visit | Attending: Radiation Oncology | Admitting: Radiation Oncology

## 2021-05-18 ENCOUNTER — Inpatient Hospital Stay: Payer: 59

## 2021-05-18 ENCOUNTER — Encounter: Payer: Self-pay | Admitting: Urology

## 2021-05-18 DIAGNOSIS — Z510 Encounter for antineoplastic radiation therapy: Secondary | ICD-10-CM | POA: Diagnosis not present

## 2021-05-18 DIAGNOSIS — C61 Malignant neoplasm of prostate: Secondary | ICD-10-CM

## 2021-05-18 LAB — RAD ONC ARIA SESSION SUMMARY
Course Elapsed Days: 56
Plan Fractions Treated to Date: 15
Plan Prescribed Dose Per Fraction: 2 Gy
Plan Total Fractions Prescribed: 15
Plan Total Prescribed Dose: 30 Gy
Reference Point Dosage Given to Date: 75 Gy
Reference Point Session Dosage Given: 2 Gy
Session Number: 40

## 2021-05-27 ENCOUNTER — Ambulatory Visit: Payer: 59 | Admitting: Family Medicine

## 2021-05-27 NOTE — Progress Notes (Unsigned)
    SUBJECTIVE:   CHIEF COMPLAINT / HPI:   Medication Management ***  Health maintenance Never had colonoscopy   PERTINENT  PMH / PSH: Prostate cancer, HFrEF, CAD, hypertension, tobacco use  OBJECTIVE:   There were no vitals taken for this visit.  ***  ASSESSMENT/PLAN:   No problem-specific Assessment & Plan notes found for this encounter.     [name], MD [place]

## 2021-06-07 ENCOUNTER — Encounter: Payer: Self-pay | Admitting: *Deleted

## 2021-06-15 ENCOUNTER — Other Ambulatory Visit: Payer: Self-pay | Admitting: Cardiology

## 2021-06-15 DIAGNOSIS — E782 Mixed hyperlipidemia: Secondary | ICD-10-CM

## 2021-06-15 DIAGNOSIS — I214 Non-ST elevation (NSTEMI) myocardial infarction: Secondary | ICD-10-CM

## 2021-06-15 DIAGNOSIS — I2510 Atherosclerotic heart disease of native coronary artery without angina pectoris: Secondary | ICD-10-CM

## 2021-06-15 DIAGNOSIS — I5022 Chronic systolic (congestive) heart failure: Secondary | ICD-10-CM

## 2021-06-16 MED ORDER — PANTOPRAZOLE SODIUM 40 MG PO TBEC: 40.0000 mg | DELAYED_RELEASE_TABLET | Freq: Every day | ORAL | 1 refills | Status: DC

## 2021-06-16 MED ORDER — ATORVASTATIN CALCIUM 20 MG PO TABS: 20.0000 mg | ORAL_TABLET | Freq: Every day | ORAL | 1 refills | Status: DC

## 2021-06-21 ENCOUNTER — Encounter: Payer: Self-pay | Admitting: Urology

## 2021-06-21 NOTE — Progress Notes (Signed)
Telephone appointment. I spoke w/ patient, verified identity and began nursing interview. Patient reports mild, dysuria. No other issues reported at this time.  Meaningful use complete. I-PSS score of 3-mild. No urinary management medications. Urology appointment- None-per patient.  Reminded patient of his 10:30am-06/22/21 telephone appointment w/ Ashlyn Bruning PA-C. I left my extension [phone] in case patient needs anything. Patient verbalized understanding.  Patient contact [phone]

## 2021-06-22 ENCOUNTER — Ambulatory Visit
Admission: RE | Admit: 2021-06-22 | Discharge: 2021-06-22 | Disposition: A | Discharge status: Home / Self Care | Payer: 59 | Source: Ambulatory Visit | Attending: Urology | Admitting: Urology

## 2021-06-22 DIAGNOSIS — C61 Malignant neoplasm of prostate: Secondary | ICD-10-CM

## 2021-06-22 NOTE — Progress Notes (Signed)
  Radiation Oncology         [phone]) [phone] ________________________________  Name: Andres Boyd MRN: [id number]  Date: 05/18/2021  DOB: 05-31-1955  End of Treatment Note  Diagnosis:   66 y.o. gentleman with Stage T2c adenocarcinoma of the prostate with Gleason score of 4+3, and PSA of 40.70.     Indication for treatment:  Curative, Definitive Radiotherapy concurrent with LT-ADT (started 10/05/20)      Radiation treatment dates:   03/23/21 - 05/18/21  Site/dose:  1. The prostate, seminal vesicles, and pelvic lymph nodes were initially treated to 45 Gy in 25 fractions of 1.8 Gy  2. The prostate only was boosted to 75 Gy with 15 additional fractions of 2.0 Gy   Beams/energy:  1. The prostate, seminal vesicles, and pelvic lymph nodes were initially treated using VMAT intensity modulated radiotherapy delivering 6 megavolt photons. Image guidance was performed with CB-CT studies prior to each fraction. He was immobilized with a body fix lower extremity mold.  2. the prostate only was boosted using VMAT intensity modulated radiotherapy delivering 6 megavolt photons. Image guidance was performed with CB-CT studies prior to each fraction. He was immobilized with a body fix lower extremity mold.  Narrative: The patient tolerated radiation treatment relatively well with minor urinary irritation and modest fatigue.  He did report increased nocturia 5-6 times per night as well as mild dysuria.  He also experienced some loose stools/diarrhea which was managed with Imodium as needed.  Plan: The patient has completed radiation treatment. He will return to radiation oncology clinic for routine followup in one month. I advised him to call or return sooner if he has any questions or concerns related to his recovery or treatment. ________________________________  Sheral Apley. Tammi Klippel, M.D.

## 2021-06-22 NOTE — Progress Notes (Signed)
Radiation Oncology         [phone]) [phone] ________________________________  Name: Kjell Brannen MRN: [id number]  Date: 06/22/2021  DOB: 1955-09-27  Post Treatment Note  CC: [name], MD  Lucas Mallow, MD  Diagnosis:   66 y.o. gentleman with Stage T2c adenocarcinoma of the prostate with Gleason score of 4+3, and PSA of 40.70.  Interval Since Last Radiation:  5 weeks; concurrent with LT-ADT (started 10/05/20)      03/23/21 - 05/18/21: 1. The prostate, seminal vesicles, and pelvic lymph nodes were initially treated to 45 Gy in 25 fractions of 1.8 Gy  2. The prostate only was boosted to 75 Gy with 15 additional fractions of 2.0 Gy   Narrative:  I spoke with the patient to conduct his routine scheduled 1 month follow up visit via telephone to spare the patient unnecessary potential exposure in the healthcare setting during the current COVID-19 pandemic.  The patient was notified in advance and gave permission to proceed with this visit format.  He tolerated radiation treatment relatively well with minor urinary irritation and modest fatigue.  He did report increased nocturia 5-6 times per night as well as mild dysuria.  He also experienced some loose stools/diarrhea which was managed with Imodium as needed.                              On review of systems, the patient states that he is doing well in general.  He continues with occasional, mild dysuria at the start of his stream but otherwise feels like his LUTS are back to baseline at this point.  He specifically denies gross hematuria, straining to void, incomplete bladder emptying or incontinence.  He reports a healthy appetite and is maintaining his weight.  He denies abdominal pain, nausea, vomiting, diarrhea or constipation.  He continues with occasional hot flashes and decreased stamina associated with the ADT but overall, is quite pleased with his progress to date.  ALLERGIES:  has No Known Allergies.  Meds: Current Outpatient  Medications  Medication Sig Dispense Refill   albuterol (VENTOLIN HFA) 108 (90 Base) MCG/ACT inhaler 1 puff every 4 (four) hours as needed.     Ascorbic Acid (VITAMIN C PO) Take by mouth daily.     aspirin EC 81 MG tablet Take 81 mg by mouth daily. Swallow whole.     atorvastatin (LIPITOR) 20 MG tablet Take 1 tablet (20 mg total) by mouth daily at 6 PM. 30 tablet 1   buPROPion (WELLBUTRIN SR) 150 MG 12 hr tablet Take one tab a day for first three days, then increase to 2 tabs a day for next 12 weeks. (Patient not taking: Reported on 03/03/2021) 60 tablet 2   carvedilol (COREG) 3.125 MG tablet TAKE 1 TABLET(3.125 MG) BY MOUTH TWICE DAILY. 30 tablet 0   Multiple Vitamins-Minerals (CENTRUM ADULTS PO) Take by mouth. Takes most days     nitroGLYCERIN (NITROSTAT) 0.4 MG SL tablet Place 1 tablet (0.4 mg total) under the tongue every 5 (five) minutes as needed for chest pain. 90 tablet 3   pantoprazole (PROTONIX) 40 MG tablet Take 1 tablet (40 mg total) by mouth daily. 30 tablet 1   sacubitril-valsartan (ENTRESTO) 49-51 MG Take 1 tablet by mouth 2 (two) times daily. 30 tablet 0   spironolactone (ALDACTONE) 25 MG tablet Take 1 tablet (25 mg total) by mouth daily. 15 tablet 0   No current facility-administered medications for  this encounter.    Physical Findings:  vitals were not taken for this visit.  Pain Assessment Pain Score: 0-No pain/10 Unable to assess due to telephone follow-up visit format.  Lab Findings: Lab Results  Component Value Date   WBC 4.5 08/13/2019   HGB 14.3 03/08/2021   HCT 42.0 03/08/2021   MCV 96 08/13/2019   PLT 262 08/13/2019     Radiographic Findings: No results found.  Impression/Plan: 1. 66 y.o. gentleman with Stage T2c adenocarcinoma of the prostate with Gleason score of 4+3, and PSA of 40.70. He will continue to follow up with urology for ongoing PSA determinations and has an appointment scheduled with Dr. Gloriann Loan in July or August 2023 when he will be due for  his next ADT injection. He understands what to expect with regards to PSA monitoring going forward. I will look forward to following his response to treatment via correspondence with urology, and would be happy to continue to participate in his care if clinically indicated. I talked to the patient about what to expect in the future, including his risk for erectile dysfunction and rectal bleeding. I encouraged him to call or return to the office if he has any questions regarding his previous radiation or possible radiation side effects. He was comfortable with this plan and will follow up as needed.     Nicholos Johns, PA-C

## 2021-06-25 NOTE — Progress Notes (Signed)
    SUBJECTIVE:   Chief complaint/HPI: annual examination  Andres Boyd is a 66 y.o. male who presents for an annual exam.   No specific concerns today.  History tabs reviewed and updated.  HTN Son checks his BP at home 3x/week. Patient not sure of the numbers. Reports he's feeling well. No dizziness, lightheadedness, vision changes, or other complaints. Good medication compliance.   PERTINENT  PMH / PSH: Prostate cancer, HFrEF, CAD, hypertension, tobacco use  OBJECTIVE:   BP 93/72   Pulse 81   Wt 193 lb 6.4 oz (87.7 kg)   SpO2 99%   BMI 26.23 kg/m   Gen: NAD, pleasant, able to participate in exam HEENT: PERRL, TM normal bilaterally, oropharynx unremarkable Neck: supple, no lymphadenopathy CV: RRR, normal S1/S2, no murmur Resp: Normal effort, lungs CTAB GI: abdomen soft, non-tender, non-distended Extremities: no edema or cyanosis Skin: warm and dry, no rashes noted Neuro: alert, no obvious focal deficits Psych: Normal affect and mood   ASSESSMENT/PLAN:   Prediabetes Last A1c 6.1% in December 2021 -Repeat A1c today  Hypertension BP on low end today. Asymptomatic. -Continue current meds (coreg, entresto, spiro) -Has upcoming appt with cardiology, defer additional management to them  Tobacco use disorder Continues to smoke 0.5PPD. -Low dose lung CT ordered -AAA screening ordered -Interested in cutting back -Scheduled with Dr Valentina Lucks to further discuss smoking cessation   Annual Examination  See AVS for age appropriate recommendations.  PHQ score 7, reviewed and discussed.  Blood pressure value is at or below goal, see above.  Considered the following screening exams based upon USPSTF recommendations: Diabetes screening: ordered- see prediabetes above Lipid panel: ordered- last LDL at goal in 2021 (currently on statin due to known CAD) Hepatitis C:  previously negative, repeat not indicated Colorectal cancer screening:  patient previously seen at [place]  but never had colonoscopy because he had chest pain. Advised to call for appt for colonoscopy. Lung cancer screening:  ordered.  See documentation below regarding discussion and indication.  PSA discussed: not indicated-- follows with urology for known prostate cancer  Follow up in 1 year or sooner if indicated.    Andres Dad, MD [place]

## 2021-06-28 ENCOUNTER — Other Ambulatory Visit: Payer: Self-pay | Admitting: *Deleted

## 2021-06-28 ENCOUNTER — Other Ambulatory Visit: Payer: Self-pay

## 2021-06-28 ENCOUNTER — Ambulatory Visit (INDEPENDENT_AMBULATORY_CARE_PROVIDER_SITE_OTHER): Payer: 59 | Admitting: Family Medicine

## 2021-06-28 ENCOUNTER — Encounter: Payer: Self-pay | Admitting: Family Medicine

## 2021-06-28 VITALS — BP 93/72 | HR 81 | Wt 193.4 lb

## 2021-06-28 DIAGNOSIS — R7303 Prediabetes: Secondary | ICD-10-CM | POA: Diagnosis not present

## 2021-06-28 DIAGNOSIS — E782 Mixed hyperlipidemia: Secondary | ICD-10-CM | POA: Diagnosis not present

## 2021-06-28 DIAGNOSIS — I10 Essential (primary) hypertension: Secondary | ICD-10-CM

## 2021-06-28 DIAGNOSIS — Z0000 Encounter for general adult medical examination without abnormal findings: Secondary | ICD-10-CM | POA: Diagnosis not present

## 2021-06-28 DIAGNOSIS — F17200 Nicotine dependence, unspecified, uncomplicated: Secondary | ICD-10-CM

## 2021-06-28 MED ORDER — ENTRESTO 49-51 MG PO TABS: 1.0000 | ORAL_TABLET | Freq: Two times a day (BID) | ORAL | 0 refills | Status: DC

## 2021-06-28 MED ORDER — CARVEDILOL 3.125 MG PO TABS: ORAL_TABLET | ORAL | 0 refills | Status: DC

## 2021-06-29 ENCOUNTER — Encounter: Payer: Self-pay | Admitting: Family Medicine

## 2021-06-29 DIAGNOSIS — I5022 Chronic systolic (congestive) heart failure: Secondary | ICD-10-CM | POA: Insufficient documentation

## 2021-06-29 DIAGNOSIS — R7303 Prediabetes: Secondary | ICD-10-CM | POA: Insufficient documentation

## 2021-06-29 LAB — LIPID PANEL
Chol/HDL Ratio: 3.1 ratio (ref 0.0–5.0)
Cholesterol, Total: 141 mg/dL (ref 100–199)
HDL: 46 mg/dL (ref >39)
LDL Chol Calc (NIH): 72 mg/dL (ref 0–99)
Triglycerides: 133 mg/dL (ref 0–149)
VLDL Cholesterol Cal: 23 mg/dL (ref 5–40)

## 2021-06-29 LAB — HEMOGLOBIN A1C
Est. average glucose Bld gHb Est-mCnc: 131 mg/dL
Hgb A1c MFr Bld: 6.2 % — ABNORMAL HIGH (ref 4.8–5.6)

## 2021-06-29 NOTE — Assessment & Plan Note (Signed)
Continues to smoke 0.5PPD. -Low dose lung CT ordered -AAA screening ordered -Interested in cutting back -Scheduled with Dr Valentina Lucks to further discuss smoking cessation

## 2021-06-29 NOTE — Assessment & Plan Note (Signed)
BP on low end today. Asymptomatic. -Continue current meds (coreg, entresto, spiro) -Has upcoming appt with cardiology, defer additional management to them

## 2021-06-29 NOTE — Assessment & Plan Note (Signed)
Last A1c 6.1% in December 2021 -Repeat A1c today

## 2021-07-06 ENCOUNTER — Telehealth: Payer: Self-pay | Admitting: *Deleted

## 2021-07-06 NOTE — Telephone Encounter (Signed)
Contacted pt to inform him of his CT appointment scheduled on 07/22/21 @ 11am at [place], C[facility]

## 2021-07-12 ENCOUNTER — Ambulatory Visit (HOSPITAL_COMMUNITY)
Admission: RE | Admit: 2021-07-12 | Discharge: 2021-07-12 | Disposition: A | Discharge status: Home / Self Care | Payer: 59 | Source: Ambulatory Visit | Attending: Internal Medicine | Admitting: Internal Medicine

## 2021-07-12 DIAGNOSIS — I10 Essential (primary) hypertension: Secondary | ICD-10-CM | POA: Insufficient documentation

## 2021-07-12 DIAGNOSIS — M79662 Pain in left lower leg: Secondary | ICD-10-CM | POA: Diagnosis not present

## 2021-07-12 DIAGNOSIS — F17210 Nicotine dependence, cigarettes, uncomplicated: Secondary | ICD-10-CM | POA: Insufficient documentation

## 2021-07-12 DIAGNOSIS — I708 Atherosclerosis of other arteries: Secondary | ICD-10-CM | POA: Insufficient documentation

## 2021-07-12 DIAGNOSIS — F17200 Nicotine dependence, unspecified, uncomplicated: Secondary | ICD-10-CM | POA: Diagnosis present

## 2021-07-12 DIAGNOSIS — I2510 Atherosclerotic heart disease of native coronary artery without angina pectoris: Secondary | ICD-10-CM | POA: Diagnosis not present

## 2021-07-12 DIAGNOSIS — Z136 Encounter for screening for cardiovascular disorders: Secondary | ICD-10-CM | POA: Diagnosis not present

## 2021-07-14 ENCOUNTER — Other Ambulatory Visit: Payer: Self-pay | Admitting: Physician Assistant

## 2021-07-14 DIAGNOSIS — Z79899 Other long term (current) drug therapy: Secondary | ICD-10-CM

## 2021-07-14 DIAGNOSIS — I214 Non-ST elevation (NSTEMI) myocardial infarction: Secondary | ICD-10-CM

## 2021-07-14 DIAGNOSIS — E782 Mixed hyperlipidemia: Secondary | ICD-10-CM

## 2021-07-14 DIAGNOSIS — R7989 Other specified abnormal findings of blood chemistry: Secondary | ICD-10-CM

## 2021-07-14 DIAGNOSIS — I2510 Atherosclerotic heart disease of native coronary artery without angina pectoris: Secondary | ICD-10-CM

## 2021-07-16 NOTE — Progress Notes (Unsigned)
Cardiology Office Note:    Date:  07/18/2021   ID:  Andres Boyd, DOB 13-Aug-1955, MRN [id number]  PCP:  [name], [place] Providers Cardiologist:  Ena Dawley, MD {  Referring MD: [name], MD    History of Present Illness:    Andres Boyd is a 66 y.o. male with a hx of nonischemic cardiomyopathy, probable PAD by duplex 03/2017, nonobstructive CAD, alcohol abuse, sinus bradycardia, tobacco abuse, HTN, HLD, hyponatremia, CKD III, prolonged QT who was previously followed by Dr. Meda Coffee who now presents to clinic for follow-up.  Per review of the record, the patient has a history of NSTEMI in 07/2015 with peak troponin of troponin 2. He underwent cardiac cath and only had mild plaque in mildly reduced EF, felt to represent Takatsubo variant vs coronary spasm - medical management recommended. EF was 40-45%. He was readmitted to [facility] 7/18 for chest pain and elevated troponin. At that time he was drinking > 8 ETOH beverages each night. UDS was + for marijuana and opioids. Nuclear stress test was abnormal so he underwent cath showing mild nonobstructive CAD, EF was normal at 50-55% with some anterolateral hypokinesis. LVEDP was normal. He was readmitted 09/2016 with severe midsternal chest pain - initially sent home from ER for negative troponin then returned with persistent symptoms and troponin eventually peaking at 3.42. D-dimer was negative. Repeat cath 09/06/16 showed no change to mild CAD from 07/2016. Echo showed significant drop in EF since July (50-55% -> 25-30%), with cath showing EF 35-40%. cMRI with LGE uptake was suggestive of myocarditis vs infiltrative CMP. HIV, hep screen and iron studies were normal. Dr. Haroldine Laws evaluated the patient. PYP scan without evidence of TTR amyloid. 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 TTE 03/2017 showed EF 60-65%, grade 1 DD, normal wall motion, normal RV. Repeat  echocardiogram in 2020 showed decrease in LVEF to 45 to 50%, the patient was started on Entresto.  He was last seen in clinic on 03/2020 where he was doing well from a CV standpoint.  Today, the patient overall feels well. No chest pain, SOB, LE edema, orthopnea, PND, or palpitations. Tolerating medications without issues. He is active and is able walk, ride his bike and fishes without exertional symptoms. Occasional cramping with walking but improves with continued activity.  Continues to smoke about 1 ppd. Working on quitting and is taking a class to help cessation. Drinks about 2 beers about 3x/week. Trying to work on diet as well due to A1C 6.2.  Past Medical History:  Diagnosis Date   1st degree AV block    Acute appendicitis 44/31/5400   Chronic systolic CHF (congestive heart failure) ([place])    followed by cardiology   CKD (chronic kidney disease), stage III (HCC)    COPD (chronic obstructive pulmonary disease) ([place])    Dyspnea    rescue inhaler prn   ED (erectile dysfunction)    ETOH abuse    GERD (gastroesophageal reflux disease)    History of non-ST elevation myocardial infarction (NSTEMI) 07/2015   History of viral myocarditis 07/2016   per cardiology possibly from alcohol , resolved   Hyperlipidemia    Hyperplasia of prostate with lower urinary tract symptoms (LUTS)    Hypertension    Hyponatremia    Lung nodule, multiple 05/2020   pulmonology--- dr byrum   Malignant neoplasm prostate [facility])    urologist--- dr bell /  radiation oncology--- dr Tammi Klippel;;  dx 06/ 2022, Gleason 4+3, PSA 40.7   Mild CAD    cardiologist--- dr Raliegh Ip. nelson/ dr bensimhon;   hx NSTEMI , cardiac cath 07-19-2015  for chest pain/ elevated troponin's,  mild non-obstructive cad w/ ef 40-45%,  felt to be takatsubo varient versus coronary spasm;  cath 07-04-2016 for chest pain,  no change mild cad, ef 35-40%   NICM (nonischemic cardiomyopathy) ([place])    07/ 2018  per echo ef 25-30% and per cardiac cath ef  35-40%;;   last echo in epic 10-28-2018 ef 45-50%   PAD (peripheral artery disease) ([place])    a. LE vasc testing 03/2017 - LE duplex for claudication 03/2017 showing 30-49% stenosis in R popliteal and total occlusion in posterior tibial. Managed medically.   Prolonged QT interval    Sinus bradycardia     Past Surgical History:  Procedure Laterality Date   CARDIAC CATHETERIZATION N/A 07/19/2015   Procedure: Left Heart Cath and Coronary Angiography;  Surgeon: Jettie Booze, MD;  Location: [place] CV LAB;  Service: Cardiovascular;  Laterality: N/A;   GOLD SEED IMPLANT N/A 03/08/2021   Procedure: GOLD SEED IMPLANT;  Surgeon: Remi Haggard, MD;  Location: [facility];  Service: Urology;  Laterality: N/A;  [place] N/A 12/30/2018   Procedure: APPENDECTOMY LAPAROSCOPIC;  Surgeon: Kinsinger, Arta Bruce, MD;  Location: [place];  Service: General;  Laterality: N/A;   LAPAROSCOPIC LYSIS OF ADHESIONS N/A 12/30/2018   Procedure: LAPAROSCOPIC LYSIS OF ADHESIONS;  Surgeon: Kieth Brightly Arta Bruce, MD;  Location: [place];  Service: General;  Laterality: N/A;   LEFT HEART CATH AND CORONARY ANGIOGRAPHY N/A 07/04/2016   Procedure: Left Heart Cath and Coronary Angiography;  Surgeon: Jettie Booze, MD;  Location: [place] CV [facility];  Service: Cardiovascular;  Laterality: N/A;   LEFT HEART CATH AND CORONARY ANGIOGRAPHY N/A 09/06/2016   Procedure: LEFT HEART CATH AND CORONARY ANGIOGRAPHY;  Surgeon: Sherren Mocha, MD;  Location: [place] CV [facility];  Service: Cardiovascular;  Laterality: N/A;   SPACE OAR INSTILLATION N/A 03/08/2021   Procedure: SPACE OAR INSTILLATION;  Surgeon: Remi Haggard, MD;  Location: [facility];  Service: Urology;  Laterality: N/A;    Current Medications: Current Meds  Medication Sig   albuterol (VENTOLIN HFA) 108 (90 Base) MCG/ACT inhaler 1 puff every 4 (four) hours as needed.   Ascorbic Acid (VITAMIN C PO)  Take by mouth daily.   aspirin EC 81 MG tablet Take 81 mg by mouth daily. Swallow whole.   Multiple Vitamins-Minerals (CENTRUM ADULTS PO) Take by mouth. Takes most days   pantoprazole (PROTONIX) 40 MG tablet Take 1 tablet (40 mg total) by mouth daily.   tamsulosin (FLOMAX) 0.4 MG CAPS capsule SMARTSIG:1 Capsule(s) By Mouth Every Evening   [DISCONTINUED] atorvastatin (LIPITOR) 20 MG tablet Take 1 tablet (20 mg total) by mouth daily at 6 PM.   [DISCONTINUED] carvedilol (COREG) 3.125 MG tablet TAKE 1 TABLET(3.125 MG) BY MOUTH TWICE DAILY.   [DISCONTINUED] nitroGLYCERIN (NITROSTAT) 0.4 MG SL tablet Place 1 tablet (0.4 mg total) under the tongue every 5 (five) minutes as needed for chest pain.   [DISCONTINUED] sacubitril-valsartan (ENTRESTO) 49-51 MG Take 1 tablet by mouth 2 (two) times daily. Please keep upcoming appt. With Cardiologist in July in order to receive future refills. Thank you.   [DISCONTINUED] spironolactone (ALDACTONE) 25 MG tablet TAKE 1 TABLET BY MOUTH EVERY DAY     Allergies:   Patient has no  known allergies.   Social History   Socioeconomic History   Marital status: Single    Spouse name: Not on file   Number of children: Not on file   Years of education: Not on file   Highest education level: Not on file  Occupational History   Not on file  Tobacco Use   Smoking status: Every Day    Packs/day: 0.50    Years: 49.00    Total pack years: 24.50    Types: Cigarettes    Start date: 01/1972   Smokeless tobacco: Never  Vaping Use   Vaping Use: Never used  Substance and Sexual Activity   Alcohol use: Not Currently    Alcohol/week: 16.0 standard drinks of alcohol    Types: 16 Cans of beer per week    Comment: 03-03-2021  per pt 4 days per week, 3-4 beers (12 oz)   Drug use: Yes    Types: Marijuana    Comment: 03-03-2021  per pt smokes daily   Sexual activity: Not on file  Other Topics Concern   Not on file  Social History Narrative   Patient unemployed. Lives in  [place] with son.    Social Determinants of Health   Financial Resource Strain: Not on file  Food Insecurity: Not on file  Transportation Needs: Not on file  Physical Activity: Not on file  Stress: Not on file  Social Connections: Not on file     Family History: The patient's family history includes Diabetes in his father, maternal grandfather, maternal grandmother, and mother; Hypertension in his maternal grandfather, maternal grandmother, and mother. There is no history of Colon cancer, Colon polyps, Esophageal cancer, Rectal cancer, or Stomach cancer.  ROS:   Please see the history of present illness.     All other systems reviewed and are negative.  EKGs/Labs/Other Studies Reviewed:    The following studies were reviewed today: TTE Nov 13, 2018: IMPRESSIONS     1. Left ventricular ejection fraction, by visual estimation, is 45 to  50%. The left ventricle has mildly decreased function. There is no left  ventricular hypertrophy.   2. Global right ventricle has normal systolic function.The right  ventricular size is normal. No increase in right ventricular wall  thickness.   3. Left atrial size was normal.   4. Right atrial size was normal.   5. The mitral valve is normal in structure. Trace mitral valve  regurgitation.   6. The tricuspid valve is grossly normal. Tricuspid valve regurgitation  was not visualized by color flow Doppler.   7. The aortic valve is tricuspid Aortic valve regurgitation was not  visualized by color flow Doppler. Mild to moderate aortic valve  sclerosis/calcification without any evidence of aortic stenosis.   8. The pulmonic valve was grossly normal. Pulmonic valve regurgitation is  not visualized by color flow Doppler.   9. Normal pulmonary artery systolic pressure.  10. The atrial septum is grossly normal.   EKG:  EKG is  ordered today.  The ekg ordered today demonstrates sinus bradycardia first degree AVB  Recent Labs: 03/08/2021: BUN 15;  Creatinine, Ser 1.00; Hemoglobin 14.3; Potassium 3.9; Sodium 142  Recent Lipid Panel    Component Value Date/Time   CHOL 141 06/28/2021 1536   TRIG 133 06/28/2021 1536   HDL 46 06/28/2021 1536   CHOLHDL 3.1 06/28/2021 1536   CHOLHDL 2.4 07/03/2016 0402   VLDL 17 07/03/2016 0402   LDLCALC 72 06/28/2021 1536     Risk Assessment/Calculations:  Physical Exam:    VS:  BP 90/60 (BP Location: Left Arm, Patient Position: Sitting, Cuff Size: Normal)   Pulse (!) 55   Ht 6' (1.829 m)   Wt 195 lb (88.5 kg)   BMI 26.45 kg/m     Wt Readings from Last 3 Encounters:  07/18/21 195 lb (88.5 kg)  07/18/21 194 lb 9.6 oz (88.3 kg)  06/28/21 193 lb 6.4 oz (87.7 kg)     GEN:  Well nourished, well developed in no acute distress HEENT: Normal NECK: No JVD; No carotid bruits CARDIAC: Bradycardic, regular RESPIRATORY:  Diminished, clear ABDOMEN: Soft, non-tender, non-distended MUSCULOSKELETAL:  No edema; No deformity  SKIN: Warm and dry NEUROLOGIC:  Alert and oriented x 3 PSYCHIATRIC:  Normal affect   ASSESSMENT:    1. Chronic systolic CHF (congestive heart failure) (HCC)   2. Elevated serum creatinine   3. History of non-ST elevation myocardial infarction (NSTEMI)   4. Coronary artery disease involving native coronary artery of native heart without angina pectoris   5. Mixed hyperlipidemia   6. Medication management   7. NICM (nonischemic cardiomyopathy) ([place])   8. Tobacco use disorder   9. Takotsubo cardiomyopathy   10. Essential hypertension   11. Claudication in peripheral vascular disease ([place])   12. Chronic systolic heart failure (HCC)    PLAN:    In order of problems listed above:  #Chronic Systolic HF: #NICM: Last TTE 10/2018 showed LVEF 45-50%. Currently appears euvolemic and compensated on exam. Tolerating medications without issues.  -Check TTE for monitoring of EF -Continue coreg 3.'125mg'$  BID -Continue entresto 49-'51mg'$  BID -Continue spironolactone '25mg'$   daily -Low Na diet  #Mild CAD: Last cath 09/2016 with mild disease. -Continue ASA '81mg'$  daily -Continue lipitor '20mg'$  daily -Continue coreg 3.'125mg'$  BID  #PAD: Has some cramping with walking but this improves with more activity and unlikely to be claudication.  -Continue ASA '81mg'$  daily -Continue lipitor '20mg'$  daily  #HTN: BP 90/60s today, No orthostatic symptoms. Will continue current regimen. -Continue coreg 3.'125mg'$  BID -Continue entresto 49-'51mg'$  BID -Continue spironolactone '25mg'$  daily  #Tobacco Abuse: Smokes about 1ppd. Very motivated to quit. Going to cessation counseling. -Encouraged cessation          Medication Adjustments/Labs and Tests Ordered: Current medicines are reviewed at length with the patient today.  Concerns regarding medicines are outlined above.  Orders Placed This Encounter  Procedures   EKG 12-Lead   ECHOCARDIOGRAM COMPLETE   Meds ordered this encounter  Medications   spironolactone (ALDACTONE) 25 MG tablet    Sig: Take 1 tablet (25 mg total) by mouth daily.    Dispense:  90 tablet    Refill:  3   sacubitril-valsartan (ENTRESTO) 49-51 MG    Sig: Take 1 tablet by mouth 2 (two) times daily.    Dispense:  60 tablet    Refill:  3   nitroGLYCERIN (NITROSTAT) 0.4 MG SL tablet    Sig: Place 1 tablet (0.4 mg total) under the tongue every 5 (five) minutes as needed for chest pain.    Dispense:  90 tablet    Refill:  3   carvedilol (COREG) 3.125 MG tablet    Sig: TAKE 1 TABLET(3.125 MG) BY MOUTH TWICE DAILY.    Dispense:  180 tablet    Refill:  3   atorvastatin (LIPITOR) 20 MG tablet    Sig: Take 1 tablet (20 mg total) by mouth daily at 6 PM.    Dispense:  90 tablet    Refill:  3    Patient Instructions  Medication Instructions:   Your physician recommends that you continue on your current medications as directed. Please refer to the Current Medication list given to you today.  *If you need a refill on your cardiac medications before your next  appointment, please call your pharmacy*   Testing/Procedures:  Your physician has requested that you have an echocardiogram. Echocardiography is a painless test that uses sound waves to create images of your heart. It provides your doctor with information about the size and shape of your heart and how well your heart's chambers and valves are working. This procedure takes approximately one hour. There are no restrictions for this procedure.   Follow-Up: At [facility], you and your health needs are our priority.  As part of our continuing mission to provide you with exceptional heart care, we have created designated Provider Care Teams.  These Care Teams include your primary Cardiologist (physician) and Advanced Practice Providers (APPs -  Physician Assistants and Nurse Practitioners) who all work together to provide you with the care you need, when you need it.  We recommend signing up for the patient portal called "MyChart".  Sign up information is provided on this After Visit Summary.  MyChart is used to connect with patients for Virtual Visits (Telemedicine).  Patients are able to view lab/test results, encounter notes, upcoming appointments, etc.  Non-urgent messages can be sent to your provider as well.   To learn more about what you can do with MyChart, go to [email or web address].    Your next appointment:   6 month(s)  The format for your next appointment:   In Person  Provider:   Dr. Johney Frame   Important Information About Sugar         Signed, Freada Bergeron, MD  07/18/2021 10:52 AM    [place]

## 2021-07-18 ENCOUNTER — Encounter: Payer: Self-pay | Admitting: Pharmacist

## 2021-07-18 ENCOUNTER — Encounter: Payer: Self-pay | Admitting: Cardiology

## 2021-07-18 ENCOUNTER — Ambulatory Visit (INDEPENDENT_AMBULATORY_CARE_PROVIDER_SITE_OTHER): Payer: 59 | Admitting: Cardiology

## 2021-07-18 ENCOUNTER — Ambulatory Visit (INDEPENDENT_AMBULATORY_CARE_PROVIDER_SITE_OTHER): Payer: 59 | Admitting: Pharmacist

## 2021-07-18 VITALS — BP 90/60 | HR 55 | Ht 72.0 in | Wt 195.0 lb

## 2021-07-18 DIAGNOSIS — E782 Mixed hyperlipidemia: Secondary | ICD-10-CM

## 2021-07-18 DIAGNOSIS — I5022 Chronic systolic (congestive) heart failure: Secondary | ICD-10-CM

## 2021-07-18 DIAGNOSIS — I252 Old myocardial infarction: Secondary | ICD-10-CM

## 2021-07-18 DIAGNOSIS — Z79899 Other long term (current) drug therapy: Secondary | ICD-10-CM

## 2021-07-18 DIAGNOSIS — I428 Other cardiomyopathies: Secondary | ICD-10-CM

## 2021-07-18 DIAGNOSIS — I5181 Takotsubo syndrome: Secondary | ICD-10-CM

## 2021-07-18 DIAGNOSIS — I10 Essential (primary) hypertension: Secondary | ICD-10-CM

## 2021-07-18 DIAGNOSIS — I739 Peripheral vascular disease, unspecified: Secondary | ICD-10-CM

## 2021-07-18 DIAGNOSIS — F17200 Nicotine dependence, unspecified, uncomplicated: Secondary | ICD-10-CM | POA: Diagnosis not present

## 2021-07-18 DIAGNOSIS — R7989 Other specified abnormal findings of blood chemistry: Secondary | ICD-10-CM | POA: Diagnosis not present

## 2021-07-18 DIAGNOSIS — I2510 Atherosclerotic heart disease of native coronary artery without angina pectoris: Secondary | ICD-10-CM

## 2021-07-18 MED ORDER — SPIRONOLACTONE 25 MG PO TABS: 25.0000 mg | ORAL_TABLET | Freq: Every day | ORAL | 3 refills | Status: DC

## 2021-07-18 MED ORDER — ENTRESTO 49-51 MG PO TABS: 1.0000 | ORAL_TABLET | Freq: Two times a day (BID) | ORAL | 3 refills | Status: DC

## 2021-07-18 MED ORDER — NICOTINE POLACRILEX 4 MG MT LOZG
4.0000 mg | LOZENGE | OROMUCOSAL | 1 refills | Status: DC | PRN
Start: 2021-07-18 — End: 2021-07-18

## 2021-07-18 MED ORDER — NITROGLYCERIN 0.4 MG SL SUBL: 0.4000 mg | SUBLINGUAL_TABLET | SUBLINGUAL | 3 refills | Status: AC | PRN

## 2021-07-18 MED ORDER — CARVEDILOL 3.125 MG PO TABS: ORAL_TABLET | ORAL | 3 refills | Status: DC

## 2021-07-18 MED ORDER — ATORVASTATIN CALCIUM 20 MG PO TABS: 20.0000 mg | ORAL_TABLET | Freq: Every day | ORAL | 3 refills | Status: DC

## 2021-07-18 NOTE — Patient Instructions (Signed)
Nicotine Replacement Therapy  Nicotine Lozenge: 4 mg Weeks 1 to 6: Use 1 lozenge every 1 to 2 hours (maximum: 5 lozenges every 6 hours; 20 lozenges/day); to increase chances of quitting, use at least 9 lozenges/day during the first 6 weeks   Weeks 7 to 9: Use 1 lozenge every 2 to 4 hours (maximum: 5 lozenges every 6 hours; 20 lozenges/day)   Weeks 10 to 12: Use 1 lozenge every 4 to 8 hours (maximum: 5 lozenges every 6 hours; 20 lozenges/day)  Decrease daily lozenge use by 1 each week.   Follow up 08/18/21 at 9:45 AM with pharmacy clinic

## 2021-07-18 NOTE — Assessment & Plan Note (Addendum)
Tobacco use disorder with moderate nicotine dependence of 49 years duration in a patient who is good candidate for success because of confidence in his ability to quit and willingness to use pharmacotherapy.    -Initiated nicotine replacement tx with nicotine lozenges 4 mg. Patient counseled on purpose, proper use, and potential adverse effects, including headache and oral irritation.    -Set goal of reducing to 2 cigarettes/day by next pharmacy clinic visit -Set goal of being tobacco free by his birthday in [birth year]

## 2021-07-18 NOTE — Progress Notes (Signed)
Reviewed: I agree with the documentation and management of Dr. Koval. 

## 2021-07-18 NOTE — Progress Notes (Deleted)
   Andres Boyd is a 66 y.o. male and has been referred to the pharmacist telephone-based smoking cessation service on 07/18/2021 by pulmonologist Dr. Marland Kitchen   Tobacco Use History Current tobacco use: 20 cigarettes/day, Cherokee (no methols), $5 Time to first cigarette: > 30 minutes Started smoking at 66 years old  Quit Attempt History  Have you tried to quit in the past? Yes  Most recent quit attempt: 1 year Longest time ever been tobacco free: 1 mo What helped? Energy level improved What was difficult? Being around people who smoke, drinking beer, wife who smoked (passed ~4 years ago)  Tobacco Use Habits: Triggers include boredum, lives alone. Does wake at night to smoke, around 3x/month Alcohol use: beer, drinks every 3-4 days Other smokers in household or daily life: lives alone, wife passed away 4 years ago  Identify social support: son lives close by  On a scale of 1-10, how CONFIDENT are you that you will successfully quit: 9, knows he can do it.   On a scale of 1-10, how IMPORTANT is it to you that you quit: 10 Motivators: health, save money   Past pharmacotherapy trials: (list what agents and why not effective): none '[]'$  Nicotine gum '[]'$  Nicotine lozenge '[]'$  Nicotine patch '[]'$  Nicotine inhaler '[]'$  Nicotine nasal spray '[]'$  Bupropion (Zyban) '[]'$  Varenicline (Chantix)  Current Outpatient Medications  Medication Instructions   albuterol (VENTOLIN HFA) 108 (90 Base) MCG/ACT inhaler 1 puff, Every 4 hours PRN   Ascorbic Acid (VITAMIN C PO) Oral, Daily   aspirin EC 81 mg, Oral, Daily, Swallow whole.   atorvastatin (LIPITOR) 20 mg, Oral, Daily-1800   carvedilol (COREG) 3.125 MG tablet TAKE 1 TABLET(3.125 MG) BY MOUTH TWICE DAILY.   Multiple Vitamins-Minerals (CENTRUM ADULTS PO) Oral, Takes most days   nitroGLYCERIN (NITROSTAT) 0.4 mg, Sublingual, Every 5 min PRN   pantoprazole (PROTONIX) 40 mg, Oral, Daily   sacubitril-valsartan (ENTRESTO) 49-51 MG 1 tablet, Oral, 2 times daily,  Please keep upcoming appt. With Cardiologist in July in order to receive future refills. Thank you.   spironolactone (ALDACTONE) 25 mg, Oral, Daily   tamsulosin (FLOMAX) 0.4 MG CAPS capsule SMARTSIG:1 Capsule(s) By Mouth Every Evening    Assessment/Plan: Patient states interest in quitting smoking. New goal of reducing to 2 cigarettes/day by end of August. Set quit date of 10/02/21.  Discussed options for smoking cessation agents and patient is agreeable to: '[]'$  Nicotine gum {Blank:19196::"2 mg","4 mg"} '[x]'$  Nicotine lozenge {Blank:19196::"2 mg","4 mg"} '[]'$  Nicotine patch {Blank:19196::"21 mg","14 mg","7 mg"} '[]'$  Bupropion (Zyban) - patient denies history of seizure/epilepsy, bulimia/anorexia, or acute alcohol withdrawal '[]'$  Varenicline (Chantix)  [facility] on [address]  Treatment was reviewed with the patient including name, instructions, goals of therapy, potential adverse effects including mild itching or redness at the point of application, headache, trouble sleeping, and/or vivid dreams.   Medication counseling:  The following counseling was provided: '[]'$  Anticipated nicotine withdrawal symptoms '[]'$  Coping skills/strategies '[]'$  Information on 1-800-QUITNOW support program '[]'$  Tell family and friends about quitting '[]'$  Stress management '[]'$  Remove tobacco products (cigarettes, lighters, ash trays) by ***  Patient was advised to contact Pulmonary Clinic if questions/concerns arise. Patient verbalized understanding of information.  Follow up {follow up:15908}  Time spent: {TIME; 0 MIN TO 60 MIN:[phone]} *** Signature

## 2021-07-18 NOTE — Progress Notes (Signed)
   S:  Chief Complaint  Patient presents with   Medication Management    Tobacco dependence   Andres Boyd is a 66 y.o. male who presents for evaluation/assistance with tobacco dependence.  PMH is significant for tobacco dependence, CAD, heart failure, HTN, NSTEMI, HLD, and prostate cancer. Patient was referred and last seen by Primary Care Provider, Dr. Rock Nephew, on 06/28/21.   Age when started using tobacco on a daily basis: 66 YO. Brand smoked Cherokee (no menthols). Number of cigarettes/day 20.  Estimated nicotine content per cigarette: 1 mg.  Estimated nicotine intake per day 20 mg.   Occasionally waking to smoke.  Fagerstrom Score Question Scoring Patient Score  How soon after waking do you smoke your first cigarette? <5 mins (3) 5-30 mins (2) 31-60 mins (1) >60 mins (0) 1  Do you find it difficult NOT to smoke in places where you shouldn't? Yes(1) No (0) 1  Which cigarette would you most hate to give up? First one in AM (1) Any other one (0) 0   How many cigarettes do you smoke/day? 10 or less (0) 11-20 (1) 21-30 (2) >30 (3) 2  Do you smoke more during the first few hours after waking? Yes (1) No (0) 0  Do you smoke if you are so ill you cannot get out of bed? Yes (1) No (0) 0    Total Score   4  Score interpretation: low 1-2, low-to-moderate 3-4, moderate 5-7, high >7  Most recent quit attempt: 1 year ago Longest time ever been tobacco free: 1 month.  Medications used in past cessation efforts include: none  Rates IMPORTANCE of quitting tobacco on 1-10 scale of 10. Rates CONFIDENCE of quitting tobacco on 1-10 scale of 9.  Most common triggers to use tobacco include; boredum, lives alone since losing wife who also smokes ~4 years ago   Motivation to quit: health, motivation from son, money   O: Clinical ASCVD: Yes   Review of Systems  All other systems reviewed and are negative.   Physical Exam Constitutional:      Appearance: Normal appearance.   Neurological:     Mental Status: He is alert.  Psychiatric:        Mood and Affect: Mood normal.        Behavior: Behavior normal.    Patient is participating in a Managed Medicaid Plan:  Yes  A/P: Tobacco use disorder with moderate nicotine dependence of 49 years duration in a patient who is good candidate for success because of confidence in his ability to quit and willingness to use pharmacotherapy.    -Initiated nicotine replacement tx with nicotine lozenges 4 mg. Patient counseled on purpose, proper use, and potential adverse effects, including headache and oral irritation.    -Set goal of reducing to 2 cigarettes/day by next pharmacy clinic visit -Set goal of being tobacco free by his birthday in October  Written patient instructions provided. Patient verbalized understanding of treatment plan.  Total time in face to face counseling 37 minutes.    Follow-up:  Pharmacist 08/18/21. Patient seen with Joseph Art, PharmD, PGY2 Pharmacy Resident.

## 2021-07-18 NOTE — Progress Notes (Signed)
   Andres Boyd is a 66 y.o. male and has been referred to the pharmacist smoking cessation service on 06/28/21 by PCP Dr. Rock Nephew.  Tobacco Use History Current tobacco use: 20 cigarettes/day Tobacco brand: Cherokee (no methols), cost $5/pack Time to first cigarette: > 30 minutes Started smoking at 66 years old  Quit Attempt History  Have you tried to quit in the past? Yes  Most recent quit attempt: 1 year Longest time ever been tobacco free: 1 mo What helped? Energy level improved What was difficult? Being around people who smoke, drinking beer, wife who smoked (passed ~4 years ago)  Tobacco Use Habits: Triggers include boredum, lives alone. Does wake at night to smoke, around 3x/month Alcohol use: beer, drinks every 3-4 days Other smokers in household or daily life: lives alone, wife passed away 4 years ago  Identify social support: son lives close by  On a scale of 1-10, how CONFIDENT are you that you will successfully quit: 9, knows he can do it.   On a scale of 1-10, how IMPORTANT is it to you that you quit: 10 Motivators: health, save money   Past pharmacotherapy trials: (list what agents and why not effective): none  Current Outpatient Medications  Medication Instructions   albuterol (VENTOLIN HFA) 108 (90 Base) MCG/ACT inhaler 1 puff, Every 4 hours PRN   Ascorbic Acid (VITAMIN C PO) Oral, Daily   aspirin EC 81 mg, Oral, Daily, Swallow whole.   atorvastatin (LIPITOR) 20 mg, Oral, Daily-1800   carvedilol (COREG) 3.125 MG tablet TAKE 1 TABLET(3.125 MG) BY MOUTH TWICE DAILY.   Multiple Vitamins-Minerals (CENTRUM ADULTS PO) Oral, Takes most days   nitroGLYCERIN (NITROSTAT) 0.4 mg, Sublingual, Every 5 min PRN   pantoprazole (PROTONIX) 40 mg, Oral, Daily   sacubitril-valsartan (ENTRESTO) 49-51 MG 1 tablet, Oral, 2 times daily   spironolactone (ALDACTONE) 25 mg, Oral, Daily   tamsulosin (FLOMAX) 0.4 MG CAPS capsule SMARTSIG:1 Capsule(s) By Mouth Every Evening     Assessment/Plan: Patient states interest in quitting smoking. Set goal of reducing to 2 cigarettes/day by end of August. Set quit date of 10/02/21.  Discussed options for smoking cessation agents and patient is agreeable to: '[x]'$  Nicotine lozenge 4 mg  Treatment was reviewed with the patient including name, instructions, goals of therapy, potential adverse effects including mild itching or redness at the point of application, headache, trouble sleeping, and/or vivid dreams.   Medication counseling:  The following counseling was provided: '[x]'$  Anticipated nicotine withdrawal symptoms '[x]'$  Coping skills/strategies '[x]'$  Tell family and friends about quitting '[x]'$  Stress management  Patient was advised to contact Pulmonary Clinic if questions/concerns arise. Patient verbalized understanding of information.   Joseph Art, PharmD, PGY2 Pharmacy Resident.

## 2021-07-18 NOTE — Patient Instructions (Signed)
Medication Instructions:   Your physician recommends that you continue on your current medications as directed. Please refer to the Current Medication list given to you today.  *If you need a refill on your cardiac medications before your next appointment, please call your pharmacy*   Testing/Procedures:  Your physician has requested that you have an echocardiogram. Echocardiography is a painless test that uses sound waves to create images of your heart. It provides your doctor with information about the size and shape of your heart and how well your heart's chambers and valves are working. This procedure takes approximately one hour. There are no restrictions for this procedure.   Follow-Up: At [facility], you and your health needs are our priority.  As part of our continuing mission to provide you with exceptional heart care, we have created designated Provider Care Teams.  These Care Teams include your primary Cardiologist (physician) and Advanced Practice Providers (APPs -  Physician Assistants and Nurse Practitioners) who all work together to provide you with the care you need, when you need it.  We recommend signing up for the patient portal called "MyChart".  Sign up information is provided on this After Visit Summary.  MyChart is used to connect with patients for Virtual Visits (Telemedicine).  Patients are able to view lab/test results, encounter notes, upcoming appointments, etc.  Non-urgent messages can be sent to your provider as well.   To learn more about what you can do with MyChart, go to [email or web address].    Your next appointment:   6 month(s)  The format for your next appointment:   In Person  Provider:   Dr. Johney Frame   Important Information About Sugar

## 2021-07-22 ENCOUNTER — Ambulatory Visit (HOSPITAL_BASED_OUTPATIENT_CLINIC_OR_DEPARTMENT_OTHER): Payer: 59

## 2021-07-22 ENCOUNTER — Ambulatory Visit (HOSPITAL_COMMUNITY)
Admission: RE | Admit: 2021-07-22 | Discharge: 2021-07-22 | Disposition: A | Discharge status: Home / Self Care | Payer: 59 | Source: Ambulatory Visit | Attending: Family Medicine | Admitting: Family Medicine

## 2021-07-22 ENCOUNTER — Encounter: Payer: Self-pay | Admitting: Cardiology

## 2021-07-22 DIAGNOSIS — I252 Old myocardial infarction: Secondary | ICD-10-CM | POA: Insufficient documentation

## 2021-07-22 DIAGNOSIS — Z122 Encounter for screening for malignant neoplasm of respiratory organs: Secondary | ICD-10-CM | POA: Diagnosis not present

## 2021-07-22 DIAGNOSIS — I110 Hypertensive heart disease with heart failure: Secondary | ICD-10-CM | POA: Diagnosis not present

## 2021-07-22 DIAGNOSIS — R079 Chest pain, unspecified: Secondary | ICD-10-CM | POA: Insufficient documentation

## 2021-07-22 DIAGNOSIS — I5022 Chronic systolic (congestive) heart failure: Secondary | ICD-10-CM

## 2021-07-22 DIAGNOSIS — F17200 Nicotine dependence, unspecified, uncomplicated: Secondary | ICD-10-CM | POA: Insufficient documentation

## 2021-07-22 DIAGNOSIS — J439 Emphysema, unspecified: Secondary | ICD-10-CM | POA: Insufficient documentation

## 2021-07-22 DIAGNOSIS — I428 Other cardiomyopathies: Secondary | ICD-10-CM

## 2021-07-22 DIAGNOSIS — I5181 Takotsubo syndrome: Secondary | ICD-10-CM | POA: Insufficient documentation

## 2021-07-22 DIAGNOSIS — E785 Hyperlipidemia, unspecified: Secondary | ICD-10-CM | POA: Diagnosis not present

## 2021-07-22 DIAGNOSIS — F17210 Nicotine dependence, cigarettes, uncomplicated: Secondary | ICD-10-CM | POA: Diagnosis not present

## 2021-07-22 DIAGNOSIS — I2510 Atherosclerotic heart disease of native coronary artery without angina pectoris: Secondary | ICD-10-CM | POA: Insufficient documentation

## 2021-07-22 DIAGNOSIS — R7303 Prediabetes: Secondary | ICD-10-CM | POA: Insufficient documentation

## 2021-07-22 LAB — ECHOCARDIOGRAM COMPLETE
Area-P 1/2: 2.97 cm2
S' Lateral: 3 cm

## 2021-07-22 NOTE — Progress Notes (Unsigned)
Patient ID: Andres Boyd, male   DOB: [birth year], 66 y.o.   MRN: [id number]  Patient declined the administration of Definity.

## 2021-07-26 ENCOUNTER — Encounter: Payer: Self-pay | Admitting: *Deleted

## 2021-08-01 ENCOUNTER — Other Ambulatory Visit: Payer: Self-pay

## 2021-08-01 MED ORDER — PANTOPRAZOLE SODIUM 40 MG PO TBEC: 40.0000 mg | DELAYED_RELEASE_TABLET | Freq: Every day | ORAL | 3 refills | Status: DC

## 2021-08-18 ENCOUNTER — Ambulatory Visit: Payer: 59 | Admitting: Pharmacist

## 2021-08-18 ENCOUNTER — Telehealth: Payer: Self-pay

## 2021-08-18 NOTE — Telephone Encounter (Signed)
Patient reported inability to obtain nicotine lozenges at pharmacy. Contacted pharmacy and lozenges are now available for him to pick up tomorrow. Returned call to patient to inform him his prescription was ready. Patient expressed gratitude.   Reports he is down to 5 cigarettes/day. Praised patient on his progress. Will follow up via phone call in 2 weeks.   Joseph Art, Pharm.D. PGY-2 Ambulatory Care Pharmacy Resident 08/18/2021 3:15 PM

## 2021-09-13 ENCOUNTER — Telehealth: Payer: Self-pay | Admitting: Pharmacist

## 2021-09-13 DIAGNOSIS — F17200 Nicotine dependence, unspecified, uncomplicated: Secondary | ICD-10-CM

## 2021-09-13 NOTE — Assessment & Plan Note (Signed)
Patient contacted for follow/up of tobacco intake reduction / cessation attempt.   Since last contact patient reports reducing from 5 to 3 cigarettes per day.   Medications currently being used;  Nicotine Lozenge - '4mg'$  PRN craving using 3-5 pieces per day.  Patient denies any significant side effects from tobacco cessation therapy.   Continues to rates IMPORTANCE of quitting tobacco as high.

## 2021-09-13 NOTE — Telephone Encounter (Signed)
-----   Message from Leavy Cella, Sophia sent at 08/18/2021  3:12 PM EDT ----- Regarding: Tobacco Cessation Lozenges '4mg'$   - started 8/17 with 5cigs use.

## 2021-09-13 NOTE — Telephone Encounter (Signed)
Patient contacted for follow/up of tobacco intake reduction / cessation attempt.   Since last contact patient reports reducing from 5 to 3 cigarettes per day.   Medications currently being used;  Nicotine Lozenge - '4mg'$  PRN craving using 3-5 pieces per day.  Patient denies any significant side effects from tobacco cessation therapy.   Continues to rates IMPORTANCE of quitting tobacco as high.   Most common triggers to use tobacco include; HABIT - smoking= first AM, and post meals 2x daily   Motivation to quit: health   Total time with patient call and documentation of interaction: 11 minutes. Follow-up phone call planned: 2-3 weeks

## 2021-09-13 NOTE — Telephone Encounter (Signed)
Noted and agree. 

## 2021-10-06 ENCOUNTER — Telehealth: Payer: Self-pay

## 2021-10-06 NOTE — Telephone Encounter (Signed)
Attempted to contacted patient regarding tobacco intake. However, patient did not answer and a HIPAA compliant voicemail was left requesting patient return call at earliest convenience.   Joseph Art, Pharm.D. PGY-2 Ambulatory Care Pharmacy Resident 10/06/2021 12:11 PM

## 2021-10-20 ENCOUNTER — Other Ambulatory Visit (HOSPITAL_COMMUNITY): Payer: Self-pay

## 2021-10-20 ENCOUNTER — Telehealth: Payer: Self-pay

## 2021-10-20 DIAGNOSIS — F17200 Nicotine dependence, unspecified, uncomplicated: Secondary | ICD-10-CM

## 2021-10-20 MED ORDER — VARENICLINE TARTRATE 0.5 MG PO TABS
0.5000 mg | ORAL_TABLET | Freq: Two times a day (BID) | ORAL | 5 refills | Status: AC
Start: 2021-10-20 — End: ?

## 2021-10-20 NOTE — Telephone Encounter (Signed)
   Andres Boyd is a 66 y.o. male who presents for evaluation/assistance with tobacco dependence.  PMH is significant for tobacco dependence, CAD, heart failure, HTN, NSTEMI, HLD, and prostate cancer. Patient was referred and last seen by Primary Care Provider, Dr. Rock Nephew, on 06/28/21.   Age when started using tobacco on a daily basis: 66 YO. Brand smoked Cherokee (no menthols). Number of cigarettes/day: 10 Estimated nicotine content per cigarette: 1 mg.  Estimated nicotine intake per day 10 mg.   Occasionally waking to smoke.  Subjective: Reports he did not like the way the lozenges made him feel so he has stopped taking. He continues to use nicotine gum PRN (using 1-2x/day when he remembers). He has never used varenicline (Chantix) before, but is willing to give it a try.    Current Outpatient Medications  Medication Instructions   albuterol (VENTOLIN HFA) 108 (90 Base) MCG/ACT inhaler 1 puff, Every 4 hours PRN   Ascorbic Acid (VITAMIN C PO) Oral, Daily   aspirin EC 81 mg, Oral, Daily, Swallow whole.   atorvastatin (LIPITOR) 20 mg, Oral, Daily-1800   carvedilol (COREG) 3.125 MG tablet TAKE 1 TABLET(3.125 MG) BY MOUTH TWICE DAILY.   Multiple Vitamins-Minerals (CENTRUM ADULTS PO) Oral, Takes most days   nicotine polacrilex (COMMIT) 4 MG lozenge 1 lozenge, Oral, As needed   nitroGLYCERIN (NITROSTAT) 0.4 mg, Sublingual, Every 5 min PRN   pantoprazole (PROTONIX) 40 mg, Oral, Daily   sacubitril-valsartan (ENTRESTO) 49-51 MG 1 tablet, Oral, 2 times daily   spironolactone (ALDACTONE) 25 mg, Oral, Daily   tamsulosin (FLOMAX) 0.4 MG CAPS capsule SMARTSIG:1 Capsule(s) By Mouth Every Evening    Assessment/Plan: Patient has reduced tobacco from 20 cigarettes to 10 cigarettes a day. Currently using nicotine gum for smoking cessation pharmacotherapy. Did not like the way the lozenges made him feel. Discussed triggers, barriers, and motivators to quitting.  - Patient agreeable to start Chantix 0.5 mg  once daily x7 days then 0.5 mg BID thereafter. - Continue nicotine gum PRN cravings.  -Treatment was reviewed with the patient including name, instructions, goals of therapy, potential adverse effects including mild itching or redness at the point of application, headache, trouble sleeping, and/or vivid dreams.  - Set goal of quit smoking by Christmas time.   Follow up 2 weeks.   Time spent: 30 mins  Joseph Art, [place].D. PGY-2 Ambulatory Care Pharmacy Resident 10/20/2021 10:17 AM

## 2021-10-20 NOTE — Telephone Encounter (Signed)
New prescription for varenicline 0.'5mg'$   Sent to pharmacy for phone interaction conducted by Joseph Art, PharmD - PGY-2 resident

## 2021-10-24 NOTE — Telephone Encounter (Signed)
Noted and agree. 

## 2021-11-10 NOTE — Telephone Encounter (Signed)
   Patient was referred and last seen by Primary Care Provider, Dr. Rock Nephew, on 06/28/21.   At last telephone visit on 10/20/2021  Baseline tobacco use: 20 cigarettes/day Current tobacco use: 3-4 cigarettes/day Quit Date: 12/26/2021  Current Outpatient Medications  Medication Instructions   albuterol (VENTOLIN HFA) 108 (90 Base) MCG/ACT inhaler 1 puff, Every 4 hours PRN   Ascorbic Acid (VITAMIN C PO) Oral, Daily   aspirin EC 81 mg, Oral, Daily, Swallow whole.   atorvastatin (LIPITOR) 20 mg, Oral, Daily-1800   carvedilol (COREG) 3.125 MG tablet TAKE 1 TABLET(3.125 MG) BY MOUTH TWICE DAILY.   Multiple Vitamins-Minerals (CENTRUM ADULTS PO) Oral, Takes most days   nicotine polacrilex (COMMIT) 4 MG lozenge 1 lozenge, Oral, As needed   nitroGLYCERIN (NITROSTAT) 0.4 mg, Sublingual, Every 5 min PRN   pantoprazole (PROTONIX) 40 mg, Oral, Daily   sacubitril-valsartan (ENTRESTO) 49-51 MG 1 tablet, Oral, 2 times daily   spironolactone (ALDACTONE) 25 mg, Oral, Daily   tamsulosin (FLOMAX) 0.4 MG CAPS capsule SMARTSIG:1 Capsule(s) By Mouth Every Evening   varenicline (CHANTIX) 0.5 mg, Oral, 2 times daily with meals, Take once daily with food for 1 week THEN increase to BID with meals    Assessment/Plan: Patient has reduced tobacco intake from 10 cigarettes/day to 3-4 cigarettes/day (smokes morning, dinner, and night most often). Currently using varenicline (Chantix) 0.5 mg daily. Discussed triggers, barriers, and motivators to quitting.  -Increase varenicline (Chantix) to 0.5 mg BID with food. Can increase at next follow up if needed.   Patient was advised to contact Pulmonary Clinic if questions/concerns arise. Patient verbalized understanding of information.  Follow up in 1 month  Time spent: 10 minutes  Joseph Art, Pharm.D. PGY-2 Ambulatory Care Pharmacy Resident 11/10/2021 11:26 AM

## 2021-12-22 ENCOUNTER — Other Ambulatory Visit: Payer: Self-pay

## 2021-12-22 DIAGNOSIS — I428 Other cardiomyopathies: Secondary | ICD-10-CM

## 2021-12-22 DIAGNOSIS — I10 Essential (primary) hypertension: Secondary | ICD-10-CM

## 2021-12-22 DIAGNOSIS — F17200 Nicotine dependence, unspecified, uncomplicated: Secondary | ICD-10-CM

## 2021-12-22 DIAGNOSIS — I5022 Chronic systolic (congestive) heart failure: Secondary | ICD-10-CM

## 2021-12-22 DIAGNOSIS — E782 Mixed hyperlipidemia: Secondary | ICD-10-CM

## 2021-12-22 DIAGNOSIS — I2510 Atherosclerotic heart disease of native coronary artery without angina pectoris: Secondary | ICD-10-CM

## 2021-12-22 DIAGNOSIS — Z79899 Other long term (current) drug therapy: Secondary | ICD-10-CM

## 2021-12-22 DIAGNOSIS — I739 Peripheral vascular disease, unspecified: Secondary | ICD-10-CM

## 2021-12-22 DIAGNOSIS — I252 Old myocardial infarction: Secondary | ICD-10-CM

## 2021-12-22 DIAGNOSIS — R7989 Other specified abnormal findings of blood chemistry: Secondary | ICD-10-CM

## 2021-12-22 DIAGNOSIS — I5181 Takotsubo syndrome: Secondary | ICD-10-CM

## 2021-12-22 MED ORDER — PANTOPRAZOLE SODIUM 40 MG PO TBEC: 40.0000 mg | DELAYED_RELEASE_TABLET | Freq: Every day | ORAL | 2 refills | Status: DC

## 2021-12-22 MED ORDER — ENTRESTO 49-51 MG PO TABS: 1.0000 | ORAL_TABLET | Freq: Two times a day (BID) | ORAL | 8 refills | Status: DC

## 2021-12-22 MED ORDER — CARVEDILOL 3.125 MG PO TABS: ORAL_TABLET | ORAL | 2 refills | Status: DC

## 2021-12-22 NOTE — Addendum Note (Signed)
Addended by: Carter Kitten D on: 12/22/2021 01:26 PM   Modules accepted: Orders

## 2021-12-22 NOTE — Addendum Note (Signed)
Addended by: Carter Kitten D on: 12/22/2021 01:28 PM   Modules accepted: Orders

## 2022-03-17 ENCOUNTER — Other Ambulatory Visit: Payer: Self-pay

## 2022-03-17 DIAGNOSIS — I5181 Takotsubo syndrome: Secondary | ICD-10-CM

## 2022-03-17 DIAGNOSIS — I5022 Chronic systolic (congestive) heart failure: Secondary | ICD-10-CM

## 2022-03-17 DIAGNOSIS — I10 Essential (primary) hypertension: Secondary | ICD-10-CM

## 2022-03-17 DIAGNOSIS — R7989 Other specified abnormal findings of blood chemistry: Secondary | ICD-10-CM

## 2022-03-17 DIAGNOSIS — I2510 Atherosclerotic heart disease of native coronary artery without angina pectoris: Secondary | ICD-10-CM

## 2022-03-17 DIAGNOSIS — I739 Peripheral vascular disease, unspecified: Secondary | ICD-10-CM

## 2022-03-17 DIAGNOSIS — I428 Other cardiomyopathies: Secondary | ICD-10-CM

## 2022-03-17 DIAGNOSIS — E782 Mixed hyperlipidemia: Secondary | ICD-10-CM

## 2022-03-17 DIAGNOSIS — I252 Old myocardial infarction: Secondary | ICD-10-CM

## 2022-03-17 DIAGNOSIS — Z79899 Other long term (current) drug therapy: Secondary | ICD-10-CM

## 2022-03-17 DIAGNOSIS — F17200 Nicotine dependence, unspecified, uncomplicated: Secondary | ICD-10-CM

## 2022-03-17 MED ORDER — ENTRESTO 49-51 MG PO TABS: 1.0000 | ORAL_TABLET | Freq: Two times a day (BID) | ORAL | 5 refills | Status: DC

## 2022-03-21 ENCOUNTER — Telehealth: Payer: Self-pay | Admitting: Family Medicine

## 2022-03-21 NOTE — Telephone Encounter (Signed)
Contacted Andres Boyd to schedule their annual wellness visit. Appointment made for 03/27/2022.  Thank you,  [place] Direct dial  [phone]

## 2022-03-23 ENCOUNTER — Encounter: Payer: Self-pay | Admitting: Family Medicine

## 2022-03-23 ENCOUNTER — Ambulatory Visit (INDEPENDENT_AMBULATORY_CARE_PROVIDER_SITE_OTHER): Payer: 59 | Admitting: Family Medicine

## 2022-03-23 VITALS — BP 100/58 | HR 66 | Ht 72.0 in | Wt 197.0 lb

## 2022-03-23 DIAGNOSIS — F17200 Nicotine dependence, unspecified, uncomplicated: Secondary | ICD-10-CM | POA: Diagnosis not present

## 2022-03-23 DIAGNOSIS — Z0000 Encounter for general adult medical examination without abnormal findings: Secondary | ICD-10-CM | POA: Diagnosis not present

## 2022-03-23 DIAGNOSIS — F1011 Alcohol abuse, in remission: Secondary | ICD-10-CM

## 2022-03-23 DIAGNOSIS — I10 Essential (primary) hypertension: Secondary | ICD-10-CM

## 2022-03-23 DIAGNOSIS — R7303 Prediabetes: Secondary | ICD-10-CM

## 2022-03-23 DIAGNOSIS — Z23 Encounter for immunization: Secondary | ICD-10-CM

## 2022-03-23 NOTE — Patient Instructions (Addendum)
It was great to see you!  Today we did your pneumonia vaccine.  We are checking some labs today.  I will send you a MyChart message with the results or call if needed.  You are overdue for a colonoscopy (screening test for colon cancer).  Please call [facility] to arrange this.  Their phone number is [phone].  If for some reason you need a new referral let me know.  The best thing you can do for your health is to quit smoking.  Schedule an appointment with Dr. Valentina Lucks on your way out for further assistance with this.  At your next annual physical you will need repeat lung cancer screening.  -Dr Rock Nephew   Things to do to keep yourself healthy  - Exercise at least 30-45 minutes a day, 3-4 days a week.  - Eat a diet with lots of fruits and vegetables (5 servings per day). - Avoid sugar sweetened beverages and processed foods whenever possible. - Seatbelts can save your life. Wear them always.  - Safe sex - use a condom to prevent STIs. - Alcohol -  If you drink, do it moderately, less than 2 drinks per day. - Sleep - aim for 7-8 hours nightly. - Limit screen time to no more than 2 hours per day.  - Goodnight. Choose someone to make decisions for you if you are not able.  - Depression is common in our stressful world. If you're feeling down or losing interest in things you normally enjoy, please come in for a visit.  - Violence - If anyone is threatening or hurting you, please call immediately.

## 2022-03-23 NOTE — Progress Notes (Signed)
    SUBJECTIVE:   Chief complaint/HPI: annual examination  Andres Boyd is a 67 y.o. male who presents today for an annual exam.   Current concerns: none  History tabs reviewed and updated.   Review of systems form reviewed.   OBJECTIVE:   BP (!) 100/58   Pulse 66   Ht 6' (1.829 m)   Wt 197 lb (89.4 kg)   SpO2 97%   BMI 26.72 kg/m   Gen: NAD, pleasant, able to participate in exam HEENT: [place]/AT, PERRLA, nares patent bilaterally, TM normal bilaterally, oropharynx unremarkable Neck: supple, no cervical lymphadenopathy CV: RRR, normal S1/S2 Resp: Normal effort, lungs CTAB GI: abdomen soft, non-tender, non-distended Extremities: no edema or cyanosis Skin: warm and dry, no rashes noted Neuro: alert, no obvious focal deficits Psych: Normal affect and mood   ASSESSMENT/PLAN:   Prediabetes Last A1c 6.2%. Check updated A1c today  Hypertension Well-controlled. BP on low end but asymptomatic. Continue current meds (coreg, entresto, spiro). Check updated CMP today  History of alcohol abuse Currently drinking 2-3 times per week, drinks 2-3 beers per occasion. No concern for excess alcohol use at this time. No prior history of withdrawal. Check CMP as above   Tobacco use disorder Taking Chantix ~3 days per week. Smoking 1/2PPD but interested in quitting. Advised to follow-up with Dr Valentina Lucks. Lung cancer screening ordered-- to be completed in July    Annual Examination  See AVS for age appropriate recommendations.  PHQ score 3, reviewed and discussed, no concerns.  Blood pressure value is at goal, discussed.   Considered the following screening exams based upon USPSTF recommendations: Diabetes screening: ordered Screening for elevated cholesterol:  deferred- LDL at goal 8 months ago HIV testing: discussed- previously negative. Repeat not indicated (not sexually active) Hepatitis C:  previously negative. Repeat not indicated Syphilis if at high risk:  not at high  risk Colorectal cancer screening: discussed, advised patient to contact [facility] to schedule colonoscopy (was not completed previously because he had chest pain). He will let me know if he needs new referral Lung cancer screening:  ordered  due to >20 pack year history, currently smoking. Vaccines: PCV 20 administered today Advanced directives: none in place. Given blue packet to fill out  Follow up in 1 year or sooner if indicated.    [name], MD [place]

## 2022-03-24 ENCOUNTER — Encounter: Payer: Self-pay | Admitting: Family Medicine

## 2022-03-24 LAB — COMPREHENSIVE METABOLIC PANEL
ALT: 35 IU/L (ref 0–44)
AST: 34 IU/L (ref 0–40)
Albumin/Globulin Ratio: 1.2 (ref 1.2–2.2)
Albumin: 4.5 g/dL (ref 3.9–4.9)
Alkaline Phosphatase: 69 IU/L (ref 44–121)
BUN/Creatinine Ratio: 13 (ref 10–24)
BUN: 15 mg/dL (ref 8–27)
Bilirubin Total: 0.2 mg/dL (ref 0.0–1.2)
CO2: 26 mmol/L (ref 20–29)
Calcium: 10.3 mg/dL — ABNORMAL HIGH (ref 8.6–10.2)
Chloride: 101 mmol/L (ref 96–106)
Creatinine, Ser: 1.17 mg/dL (ref 0.76–1.27)
Globulin, Total: 3.9 g/dL (ref 1.5–4.5)
Glucose: 94 mg/dL (ref 70–99)
Potassium: 4.7 mmol/L (ref 3.5–5.2)
Sodium: 141 mmol/L (ref 134–144)
Total Protein: 8.4 g/dL (ref 6.0–8.5)
eGFR: 68 mL/min/{1.73_m2} (ref >59)

## 2022-03-24 LAB — HEMOGLOBIN A1C
Est. average glucose Bld gHb Est-mCnc: 126 mg/dL
Hgb A1c MFr Bld: 6 % — ABNORMAL HIGH (ref 4.8–5.6)

## 2022-03-24 NOTE — Assessment & Plan Note (Signed)
Well-controlled. BP on low end but asymptomatic. Continue current meds (coreg, entresto, spiro). Check updated CMP today

## 2022-03-24 NOTE — Assessment & Plan Note (Signed)
Last A1c 6.2%. Check updated A1c today

## 2022-03-24 NOTE — Assessment & Plan Note (Addendum)
Taking Chantix ~3 days per week. Smoking 1/2PPD but interested in quitting. Advised to follow-up with Dr Valentina Lucks. Lung cancer screening ordered-- to be completed in July

## 2022-03-24 NOTE — Assessment & Plan Note (Signed)
Currently drinking 2-3 times per week, drinks 2-3 beers per occasion. No concern for excess alcohol use at this time. No prior history of withdrawal. Check CMP as above

## 2022-03-26 NOTE — Patient Instructions (Signed)
Health Maintenance, Male Adopting a healthy lifestyle and getting preventive care are important in promoting health and wellness. Ask your health care provider about: The right schedule for you to have regular tests and exams. Things you can do on your own to prevent diseases and keep yourself healthy. What should I know about diet, weight, and exercise? Eat a healthy diet  Eat a diet that includes plenty of vegetables, fruits, low-fat dairy products, and lean protein. Do not eat a lot of foods that are high in solid fats, added sugars, or sodium. Maintain a healthy weight Body mass index (BMI) is a measurement that can be used to identify possible weight problems. It estimates body fat based on height and weight. Your health care provider can help determine your BMI and help you achieve or maintain a healthy weight. Get regular exercise Get regular exercise. This is one of the most important things you can do for your health. Most adults should: Exercise for at least 150 minutes each week. The exercise should increase your heart rate and make you sweat (moderate-intensity exercise). Do strengthening exercises at least twice a week. This is in addition to the moderate-intensity exercise. Spend less time sitting. Even light physical activity can be beneficial. Watch cholesterol and blood lipids Have your blood tested for lipids and cholesterol at 68 years of age, then have this test every 5 years. You may need to have your cholesterol levels checked more often if: Your lipid or cholesterol levels are high. You are older than 68 years of age. You are at high risk for heart disease. What should I know about cancer screening? Many types of cancers can be detected early and may often be prevented. Depending on your health history and family history, you may need to have cancer screening at various ages. This may include screening for: Colorectal cancer. Prostate cancer. Skin cancer. Lung  cancer. What should I know about heart disease, diabetes, and high blood pressure? Blood pressure and heart disease High blood pressure causes heart disease and increases the risk of stroke. This is more likely to develop in people who have high blood pressure readings or are overweight. Talk with your health care provider about your target blood pressure readings. Have your blood pressure checked: Every 3-5 years if you are 18-39 years of age. Every year if you are 40 years old or older. If you are between the ages of 65 and 75 and are a current or former smoker, ask your health care provider if you should have a one-time screening for abdominal aortic aneurysm (AAA). Diabetes Have regular diabetes screenings. This checks your fasting blood sugar level. Have the screening done: Once every three years after age 45 if you are at a normal weight and have a low risk for diabetes. More often and at a younger age if you are overweight or have a high risk for diabetes. What should I know about preventing infection? Hepatitis B If you have a higher risk for hepatitis B, you should be screened for this virus. Talk with your health care provider to find out if you are at risk for hepatitis B infection. Hepatitis C Blood testing is recommended for: Everyone born from 1945 through 1965. Anyone with known risk factors for hepatitis C. Sexually transmitted infections (STIs) You should be screened each year for STIs, including gonorrhea and chlamydia, if: You are sexually active and are younger than 68 years of age. You are older than 68 years of age and your   health care provider tells you that you are at risk for this type of infection. Your sexual activity has changed since you were last screened, and you are at increased risk for chlamydia or gonorrhea. Ask your health care provider if you are at risk. Ask your health care provider about whether you are at high risk for HIV. Your health care provider  may recommend a prescription medicine to help prevent HIV infection. If you choose to take medicine to prevent HIV, you should first get tested for HIV. You should then be tested every 3 months for as long as you are taking the medicine. Follow these instructions at home: Alcohol use Do not drink alcohol if your health care provider tells you not to drink. If you drink alcohol: Limit how much you have to 0-2 drinks a day. Know how much alcohol is in your drink. In the [place] one drink equals one 12 oz bottle of beer (355 mL), one 5 oz glass of wine (148 mL), or one 1 oz glass of hard liquor (44 mL). Lifestyle Do not use any products that contain nicotine or tobacco. These products include cigarettes, chewing tobacco, and vaping devices, such as e-cigarettes. If you need help quitting, ask your health care provider. Do not use street drugs. Do not share needles. Ask your health care provider for help if you need support or information about quitting drugs. General instructions Schedule regular health, dental, and eye exams. Stay current with your vaccines. Tell your health care provider if: You often feel depressed. You have ever been abused or do not feel safe at home. Summary Adopting a healthy lifestyle and getting preventive care are important in promoting health and wellness. Follow your health care provider's instructions about healthy diet, exercising, and getting tested or screened for diseases. Follow your health care provider's instructions on monitoring your cholesterol and blood pressure. This information is not intended to replace advice given to you by your health care provider. Make sure you discuss any questions you have with your health care provider. Document Revised: 05/10/2020 Document Reviewed: 05/10/2020 Elsevier Patient Education  2023 Elsevier Inc.  

## 2022-03-26 NOTE — Progress Notes (Unsigned)
I connected with  [name] on 03/27/2022 by a audio enabled telemedicine application and verified that I am speaking with the correct person using two identifiers.  Patient Location: Home  Provider Location: Home Office  I discussed the limitations of evaluation and management by telemedicine. The patient expressed understanding and agreed to proceed.   Subjective:   Andres Boyd is a 67 y.o. male who presents for an Initial Medicare Annual Wellness Visit.  Review of Systems    Per HPI unless specifically indicated below. Cardiac Risk Factors include: advanced age (>26men, >75 women);male gender, Hypertension, CAD, and Hyperlipidemia.           Objective:       03/23/2022    2:49 PM 07/18/2021   10:23 AM 07/18/2021    9:13 AM  Vitals with BMI  Height 6\' 0"  6\' 0"  6' 0.441"  Weight 197 lbs 195 lbs 194 lbs 10 oz  BMI 26.71 [id number]  Systolic [id number] 90 96  Diastolic 58 60 65  Pulse 66 55 58    Today's Vitals   03/27/22 1353  PainSc: 0-No pain   There is no height or weight on file to calculate BMI.     03/27/2022    1:56 PM 03/23/2022    2:48 PM 06/28/2021    2:10 PM 06/21/2021    2:37 PM 03/08/2021    7:01 AM 10/01/2020   10:13 AM 12/17/2019   10:48 AM  Advanced Directives  Does Patient Have a Medical Advance Directive? No No No No No No No  Would patient like information on creating a medical advance directive? Yes (MAU/Ambulatory/Procedural Areas - Information given) No - Patient declined No - Patient declined  No - Patient declined  No - Patient declined    Current Medications (verified) Outpatient Encounter Medications as of 03/27/2022  Medication Sig   Ascorbic Acid (VITAMIN C PO) Take by mouth daily.   aspirin EC 81 MG tablet Take 81 mg by mouth daily. Swallow whole.   atorvastatin (LIPITOR) 20 MG tablet Take 1 tablet (20 mg total) by mouth daily at 6 PM.   carvedilol (COREG) 3.125 MG tablet TAKE 1 TABLET(3.125 MG) BY MOUTH TWICE DAILY.   Multiple  Vitamins-Minerals (CENTRUM ADULTS PO) Take by mouth. Takes most days   pantoprazole (PROTONIX) 40 MG tablet Take 1 tablet (40 mg total) by mouth daily.   sacubitril-valsartan (ENTRESTO) 49-51 MG Take 1 tablet by mouth 2 (two) times daily.   spironolactone (ALDACTONE) 25 MG tablet Take 1 tablet (25 mg total) by mouth daily.   tamsulosin (FLOMAX) 0.4 MG CAPS capsule SMARTSIG:1 Capsule(s) By Mouth Every Evening   albuterol (VENTOLIN HFA) 108 (90 Base) MCG/ACT inhaler 1 puff every 4 (four) hours as needed. (Patient not taking: Reported on 03/27/2022)   nicotine polacrilex (COMMIT) 4 MG lozenge Take 1 lozenge by mouth as needed. (Patient not taking: Reported on 03/27/2022)   nitroGLYCERIN (NITROSTAT) 0.4 MG SL tablet Place 1 tablet (0.4 mg total) under the tongue every 5 (five) minutes as needed for chest pain.   varenicline (CHANTIX) 0.5 MG tablet Take 1 tablet (0.5 mg total) by mouth 2 (two) times daily with a meal. Take once daily with food for 1 week THEN increase to BID with meals (Patient not taking: Reported on 03/27/2022)   No facility-administered encounter medications on file as of 03/27/2022.    Allergies (verified) Patient has no known allergies.   History: Past Medical History:  Diagnosis Date   1st  degree AV block    Acute appendicitis [id number]   Chronic systolic CHF (congestive heart failure) ([place])    followed by cardiology   CKD (chronic kidney disease), stage III ([place])    COPD (chronic obstructive pulmonary disease) ([place])    ED (erectile dysfunction)    ETOH abuse    GERD (gastroesophageal reflux disease)    History of non-ST elevation myocardial infarction (NSTEMI) 07/2015   History of viral myocarditis 07/2016   per cardiology possibly from alcohol , resolved   Hyperlipidemia    Hypertension    Hyponatremia    Lung nodule, multiple 05/2020   pulmonology--- dr byrum   Malignant neoplasm prostate [facility])    urologist--- dr bell /  radiation oncology--- dr Tammi Klippel;;   dx  06/ 2022, Gleason 4+3, PSA 40.7   Mild CAD    cardiologist--- dr Raliegh Ip. nelson/ dr bensimhon;   hx NSTEMI , cardiac cath 07-19-2015  for chest pain/ elevated troponin's,  mild non-obstructive cad w/ ef 40-45%,  felt to be takatsubo varient versus coronary spasm;  cath 07-04-2016 for chest pain,  no change mild cad, ef 35-40%   NICM (nonischemic cardiomyopathy) ([place])    07/ 2018  per echo ef 25-30% and per cardiac cath ef 35-40%;;   last echo in epic 10-28-2018 ef 45-50%   PAD (peripheral artery disease) ([place])    a. LE vasc testing 03/2017 - LE duplex for claudication 03/2017 showing 30-49% stenosis in R popliteal and total occlusion in posterior tibial. Managed medically.   Prolonged QT interval    Sinus bradycardia    Past Surgical History:  Procedure Laterality Date   CARDIAC CATHETERIZATION N/A 07/19/2015   Procedure: Left Heart Cath and Coronary Angiography;  Surgeon: Jettie Booze, MD;  Location: [place] [facility];  Service: Cardiovascular;  Laterality: N/A;   GOLD SEED IMPLANT N/A 03/08/2021   Procedure: GOLD SEED IMPLANT;  Surgeon: Remi Haggard, MD;  [facility];  Service: Urology;  Laterality: N/A;  [place] N/A 12/30/2018   Procedure: APPENDECTOMY LAPAROSCOPIC;  Surgeon: Kinsinger, Arta Bruce, MD;  Location: [place];  Service: General;  Laterality: N/A;   LAPAROSCOPIC LYSIS OF ADHESIONS N/A 12/30/2018   Procedure: LAPAROSCOPIC LYSIS OF ADHESIONS;  Surgeon: Kieth Brightly Arta Bruce, MD;  Location: [place];  Service: General;  Laterality: N/A;   LEFT HEART CATH AND CORONARY ANGIOGRAPHY N/A 07/04/2016   Procedure: Left Heart Cath and Coronary Angiography;  Surgeon: Jettie Booze, MD;  Location: [place] [facility];  Service: Cardiovascular;  Laterality: N/A;   LEFT HEART CATH AND CORONARY ANGIOGRAPHY N/A 09/06/2016   Procedure: LEFT HEART CATH AND CORONARY ANGIOGRAPHY;  Surgeon: Sherren Mocha, MD;  Location: [place] [facility];  Service: Cardiovascular;  Laterality: N/A;   SPACE OAR INSTILLATION N/A 03/08/2021   Procedure: SPACE OAR INSTILLATION;  Surgeon: Remi Haggard, MD;  Location[facility];  Service: Urology;  Laterality: N/A;   Family History  Problem Relation Age of Onset   Diabetes Mother    Hypertension Mother    Diabetes Father    Diabetes Maternal Grandmother    Hypertension Maternal Grandmother    Diabetes Maternal Grandfather    Hypertension Maternal Grandfather    Colon cancer Neg Hx    Colon polyps Neg Hx    Esophageal cancer Neg Hx    Rectal cancer Neg Hx    Stomach cancer Neg Hx    Social History  Socioeconomic History   Marital status: Single    Spouse name: Not on file   Number of children: 2   Years of education: Not on file   Highest education level: Not on file  Occupational History   Occupation: Disability  Tobacco Use   Smoking status: Every Day    Packs/day: 0.50    Years: 49.00    Additional pack years: 0.00    Total pack years: 24.50    Types: Cigarettes    Start date: 01/1972   Smokeless tobacco: Never   Tobacco comments:    Hx of 1 ppd smoker during adult life.   Vaping Use   Vaping Use: Never used  Substance and Sexual Activity   Alcohol use: Not Currently    Alcohol/week: 9.0 standard drinks of alcohol    Types: 9 Cans of beer per week   Drug use: Yes    Types: Marijuana    Comment: 03-03-2021  per pt smokes daily   Sexual activity: Not on file  Other Topics Concern   Not on file  Social History Narrative   Patient unemployed. Lives in [place] with son.    Social Determinants of Health   Financial Resource Strain: Low Risk  (03/27/2022)   Overall Financial Resource Strain (CARDIA)    Difficulty of Paying Living Expenses: Not hard at all  Food Insecurity: No Food Insecurity (03/27/2022)   Hunger Vital Sign    Worried About Running Out of Food in the Last Year: Never true    Ran Out of Food in the Last Year: Never true   Transportation Needs: No Transportation Needs (03/27/2022)   PRAPARE - Hydrologist (Medical): No    Lack of Transportation (Non-Medical): No  Physical Activity: Insufficiently Active (03/27/2022)   Exercise Vital Sign    Days of Exercise per Week: 7 days    Minutes of Exercise per Session: 20 min  Stress: No Stress Concern Present (03/27/2022)   [place]    Feeling of Stress : Not at all  Social Connections: Moderately Isolated (03/27/2022)   Social Connection and Isolation Panel [NHANES]    Frequency of Communication with Friends and Family: More than three times a week    Frequency of Social Gatherings with Friends and Family: More than three times a week    Attends Religious Services: 1 to 4 times per year    Active Member of [employer] or Organizations: No    Attends Archivist Meetings: Never    Marital Status: Divorced    Tobacco Counseling Taking Chantix ~3 days per week. Smoking 1/2PPD but interested in quitting. Advised to follow-up with Dr Valentina Lucks. Lung cancer screening ordered-- to be completed in July   Clinical Intake:     Pain : No/denies pain Pain Score: 0-No pain     Nutritional Status: BMI 25 -29 Overweight Nutritional Risks: None Diabetes: No  How often do you need to have someone help you when you read instructions, pamphlets, or other written materials from your doctor or pharmacy?: 1 - Never  Diabetic? No  Interpreter Needed?: No  Information entered by :: Donnie Mesa, CMA   Activities of Daily Living    03/27/2022    1:52 PM  In your present state of health, do you have any difficulty performing the following activities:  Hearing? 0  Vision? 1  Difficulty concentrating or making decisions? 0  Walking or climbing stairs? 0  Dressing or bathing? 0  Doing errands, shopping? 0    Patient Care Team: [name], MD as PCP - General  (Family Medicine) Dorothy Spark, MD as PCP - Cardiology (Cardiology)  Indicate any recent Medical Services you may have received from other than [facility] providers in the past year (date may be approximate).     Assessment:   This is a routine wellness examination for Valin.  Hearing/Vision screen Denies any hearing issues. Denies any vision changes. Overdue for an Annual Eye Exam  Dietary issues and exercise activities discussed: Current Exercise Habits: Structured exercise class, Type of exercise: strength training/weights;walking, Time (Minutes): 20, Frequency (Times/Week): 3, Weekly Exercise (Minutes/Week): 60, Intensity: Moderate, Exercise limited by: None identified   Goals Addressed   None    Depression Screen    03/27/2022    1:51 PM 03/23/2022    2:53 PM 06/28/2021    2:09 PM 12/17/2019   10:50 AM 12/29/2016    1:30 PM  PHQ 2/9 Scores  PHQ - 2 Score 0 3 2 0 0  PHQ- 9 Score  3 7 0     Fall Risk    03/27/2022    1:52 PM 03/23/2022    2:48 PM 06/28/2021    2:09 PM  [place] in the past year? 0 0 0  Number falls in past yr: 0  0  Injury with Fall? 0  0  Risk for fall due to : No Fall Risks    Follow up Falls evaluation completed      [place]:  Any stairs in or around the home? No  If so, are there any without handrails? No  Home free of loose throw rugs in walkways, pet beds, electrical cords, etc? Yes  Adequate lighting in your home to reduce risk of falls? Yes   ASSISTIVE DEVICES UTILIZED TO PREVENT FALLS:  Life alert? No  Use of a cane, walker or w/c? No  Grab bars in the bathroom? Yes  Shower chair or bench in shower? No  Elevated toilet seat or a handicapped toilet? No   TIMED UP AND GO:  Was the test performed?  Unable to perform, virtual appointment   Cognitive Function:        03/27/2022    1:59 PM  6CIT Screen  What Year? 0 points  What month? 0 points  What time? 0 points  Count back from  20 0 points  Months in reverse 0 points  Repeat phrase 0 points  Total Score 0 points    Immunizations Immunization History  Administered Date(s) Administered   Influenza,inj,Quad PF,6+ Mos 11/10/2016, 11/15/2017, 09/11/2018   Influenza-Unspecified 11/15/2017   PFIZER(Purple Top)SARS-COV-2 Vaccination 03/09/2019, 04/08/2019, 12/17/2019   PNEUMOCOCCAL CONJUGATE-20 03/23/2022   Pneumococcal Polysaccharide-23 07/03/2016   Tdap 12/29/2016   Zoster Recombinat (Shingrix) 05/16/2017, 10/17/2017    TDAP status: Up to date  Flu Vaccine status: Due, Education has been provided regarding the importance of this vaccine. Advised may receive this vaccine at local pharmacy or Health Dept. Aware to provide a copy of the vaccination record if obtained from local pharmacy or Health Dept. Verbalized acceptance and understanding.  Pneumococcal vaccine status: Up to date  Covid-19 vaccine status: Information provided on how to obtain vaccines.   Qualifies for Shingles Vaccine? Yes   Zostavax completed Yes   Shingrix Completed?: Yes  Screening Tests Health Maintenance  Topic Date Due   COLONOSCOPY (Pts 45-78yrs Insurance coverage will  need to be confirmed)  Never done   COVID-19 Vaccine (4 - 2023-24 season) 09/02/2021   INFLUENZA VACCINE  04/02/2022 (Originally 08/02/2021)   Lung Cancer Screening  07/23/2022   Medicare Annual Wellness (AWV)  03/27/2023   DTaP/Tdap/Td (2 - Td or Tdap) 12/30/2026   Pneumonia Vaccine 102+ Years old  Completed   Hepatitis C Screening  Completed   Zoster Vaccines- Shingrix  Completed   HPV VACCINES  Aged Out    Health Maintenance  Health Maintenance Due  Topic Date Due   COLONOSCOPY (Pts 45-2yrs Insurance coverage will need to be confirmed)  Never done   COVID-19 Vaccine (4 - 2023-24 season) 09/02/2021   Colorectal Cancer Screening:Was advised  by PCP to contact [facility] to schedule colonoscopy (was not completed previously because he had chest pain). He  will let me know if he needs new referral   Lung Cancer Screening: (Low Dose CT Chest recommended if Age 47-80 years, 30 pack-year currently smoking OR have quit w/in 15years.) does qualify.   Lung Cancer Screening Referral: ordered on 03/24/2022  Additional Screening:  Hepatitis C Screening: does qualify; Completed 09/06/2016  Vision Screening: Recommended annual ophthalmology exams for early detection of glaucoma and other disorders of the eye. Is the patient up to date with their annual eye exam?  No Who is the provider or what is the name of the office in which the patient attends annual eye exams?  If pt is not established with a provider, would they like to be referred to a provider to establish care? No .   Dental Screening: Recommended annual dental exams for proper oral hygiene  Community Resource Referral / Chronic Care Management: CRR required this visit?  No   CCM required this visit?  No      Plan:     I have personally reviewed and noted the following in the patient's chart:   Medical and social history Use of alcohol, tobacco or illicit drugs  Current medications and supplements including opioid prescriptions. Patient is not currently taking opioid prescriptions. Functional ability and status Nutritional status Physical activity Advanced directives List of other physicians Hospitalizations, surgeries, and ER visits in previous 12 months Vitals Screenings to include cognitive, depression, and falls Referrals and appointments  In addition, I have reviewed and discussed with patient certain preventive protocols, quality metrics, and best practice recommendations. A written personalized care plan for preventive services as well as general preventive health recommendations were provided to patient.     Wilson Singer, [place]   03/27/2022  Nurse Notes: Approximately 30 minute Non-Face -To-Face Medicare Wellness Visit

## 2022-03-27 ENCOUNTER — Ambulatory Visit (INDEPENDENT_AMBULATORY_CARE_PROVIDER_SITE_OTHER): Payer: 59

## 2022-03-27 DIAGNOSIS — Z0000 Encounter for general adult medical examination without abnormal findings: Secondary | ICD-10-CM

## 2022-03-28 ENCOUNTER — Telehealth: Payer: Self-pay

## 2022-03-28 NOTE — Telephone Encounter (Signed)
Called patient and left voice mail for upcoming appointment.  CT Chest Lung [place] Entrance A 1100 with arrival at 1045  .Ozella Almond, CMA

## 2022-07-24 ENCOUNTER — Ambulatory Visit (HOSPITAL_COMMUNITY)
Admission: RE | Admit: 2022-07-24 | Discharge: 2022-07-24 | Disposition: A | Discharge status: Home / Self Care | Payer: 59 | Source: Ambulatory Visit | Attending: Family Medicine | Admitting: Family Medicine

## 2022-07-24 DIAGNOSIS — F17200 Nicotine dependence, unspecified, uncomplicated: Secondary | ICD-10-CM | POA: Insufficient documentation

## 2022-07-24 DIAGNOSIS — F17210 Nicotine dependence, cigarettes, uncomplicated: Secondary | ICD-10-CM | POA: Diagnosis not present

## 2022-07-24 DIAGNOSIS — Z122 Encounter for screening for malignant neoplasm of respiratory organs: Secondary | ICD-10-CM | POA: Insufficient documentation

## 2022-07-24 DIAGNOSIS — I2510 Atherosclerotic heart disease of native coronary artery without angina pectoris: Secondary | ICD-10-CM | POA: Diagnosis not present

## 2022-07-24 DIAGNOSIS — J439 Emphysema, unspecified: Secondary | ICD-10-CM | POA: Diagnosis not present

## 2022-08-29 ENCOUNTER — Encounter (HOSPITAL_BASED_OUTPATIENT_CLINIC_OR_DEPARTMENT_OTHER): Payer: Self-pay | Admitting: Student

## 2022-08-29 ENCOUNTER — Ambulatory Visit (INDEPENDENT_AMBULATORY_CARE_PROVIDER_SITE_OTHER): Payer: 59

## 2022-08-29 ENCOUNTER — Ambulatory Visit (INDEPENDENT_AMBULATORY_CARE_PROVIDER_SITE_OTHER): Payer: 59 | Admitting: Student

## 2022-08-29 ENCOUNTER — Other Ambulatory Visit (HOSPITAL_BASED_OUTPATIENT_CLINIC_OR_DEPARTMENT_OTHER): Payer: Self-pay

## 2022-08-29 DIAGNOSIS — M25561 Pain in right knee: Secondary | ICD-10-CM | POA: Diagnosis not present

## 2022-08-29 MED ORDER — MELOXICAM 15 MG PO TABS
15.0000 mg | ORAL_TABLET | Freq: Every day | ORAL | 0 refills | Status: AC
  Filled 2022-08-29: qty 10, 10d supply, fill #0
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
## 2022-09-11 ENCOUNTER — Other Ambulatory Visit: Payer: Self-pay | Admitting: Physician Assistant

## 2022-09-11 ENCOUNTER — Other Ambulatory Visit: Payer: Self-pay

## 2022-09-11 DIAGNOSIS — E782 Mixed hyperlipidemia: Secondary | ICD-10-CM

## 2022-09-11 DIAGNOSIS — I5022 Chronic systolic (congestive) heart failure: Secondary | ICD-10-CM

## 2022-09-11 DIAGNOSIS — Z79899 Other long term (current) drug therapy: Secondary | ICD-10-CM

## 2022-09-11 DIAGNOSIS — I428 Other cardiomyopathies: Secondary | ICD-10-CM

## 2022-09-11 DIAGNOSIS — I10 Essential (primary) hypertension: Secondary | ICD-10-CM

## 2022-09-11 DIAGNOSIS — I5181 Takotsubo syndrome: Secondary | ICD-10-CM

## 2022-09-11 DIAGNOSIS — I252 Old myocardial infarction: Secondary | ICD-10-CM

## 2022-09-11 DIAGNOSIS — I2510 Atherosclerotic heart disease of native coronary artery without angina pectoris: Secondary | ICD-10-CM

## 2022-09-11 DIAGNOSIS — F17200 Nicotine dependence, unspecified, uncomplicated: Secondary | ICD-10-CM

## 2022-09-11 DIAGNOSIS — R7989 Other specified abnormal findings of blood chemistry: Secondary | ICD-10-CM

## 2022-09-11 DIAGNOSIS — I739 Peripheral vascular disease, unspecified: Secondary | ICD-10-CM

## 2022-09-11 MED ORDER — ATORVASTATIN CALCIUM 20 MG PO TABS: 20.0000 mg | ORAL_TABLET | Freq: Every day | ORAL | 0 refills | Status: DC

## 2022-09-25 ENCOUNTER — Other Ambulatory Visit: Payer: Self-pay

## 2022-09-25 DIAGNOSIS — I5181 Takotsubo syndrome: Secondary | ICD-10-CM

## 2022-09-25 DIAGNOSIS — I739 Peripheral vascular disease, unspecified: Secondary | ICD-10-CM

## 2022-09-25 DIAGNOSIS — F17200 Nicotine dependence, unspecified, uncomplicated: Secondary | ICD-10-CM

## 2022-09-25 DIAGNOSIS — I2510 Atherosclerotic heart disease of native coronary artery without angina pectoris: Secondary | ICD-10-CM

## 2022-09-25 DIAGNOSIS — R7989 Other specified abnormal findings of blood chemistry: Secondary | ICD-10-CM

## 2022-09-25 DIAGNOSIS — Z79899 Other long term (current) drug therapy: Secondary | ICD-10-CM

## 2022-09-25 DIAGNOSIS — I10 Essential (primary) hypertension: Secondary | ICD-10-CM

## 2022-09-25 DIAGNOSIS — I428 Other cardiomyopathies: Secondary | ICD-10-CM

## 2022-09-25 DIAGNOSIS — I252 Old myocardial infarction: Secondary | ICD-10-CM

## 2022-09-25 DIAGNOSIS — I5022 Chronic systolic (congestive) heart failure: Secondary | ICD-10-CM

## 2022-09-25 DIAGNOSIS — E782 Mixed hyperlipidemia: Secondary | ICD-10-CM

## 2022-09-25 MED ORDER — SPIRONOLACTONE 25 MG PO TABS: 25.0000 mg | ORAL_TABLET | Freq: Every day | ORAL | 0 refills | Status: DC

## 2022-10-10 ENCOUNTER — Other Ambulatory Visit: Payer: Self-pay

## 2022-10-10 DIAGNOSIS — I10 Essential (primary) hypertension: Secondary | ICD-10-CM

## 2022-10-10 DIAGNOSIS — F17200 Nicotine dependence, unspecified, uncomplicated: Secondary | ICD-10-CM

## 2022-10-10 DIAGNOSIS — I428 Other cardiomyopathies: Secondary | ICD-10-CM

## 2022-10-10 DIAGNOSIS — R7989 Other specified abnormal findings of blood chemistry: Secondary | ICD-10-CM

## 2022-10-10 DIAGNOSIS — I5181 Takotsubo syndrome: Secondary | ICD-10-CM

## 2022-10-10 DIAGNOSIS — I2510 Atherosclerotic heart disease of native coronary artery without angina pectoris: Secondary | ICD-10-CM

## 2022-10-10 DIAGNOSIS — E782 Mixed hyperlipidemia: Secondary | ICD-10-CM

## 2022-10-10 DIAGNOSIS — I739 Peripheral vascular disease, unspecified: Secondary | ICD-10-CM

## 2022-10-10 DIAGNOSIS — I5022 Chronic systolic (congestive) heart failure: Secondary | ICD-10-CM

## 2022-10-10 DIAGNOSIS — Z79899 Other long term (current) drug therapy: Secondary | ICD-10-CM

## 2022-10-10 DIAGNOSIS — I252 Old myocardial infarction: Secondary | ICD-10-CM

## 2022-10-10 MED ORDER — ENTRESTO 49-51 MG PO TABS: 1.0000 | ORAL_TABLET | Freq: Two times a day (BID) | ORAL | 0 refills | Status: DC

## 2022-10-19 ENCOUNTER — Other Ambulatory Visit: Payer: Self-pay | Admitting: Physician Assistant

## 2022-10-19 DIAGNOSIS — I428 Other cardiomyopathies: Secondary | ICD-10-CM

## 2022-10-19 DIAGNOSIS — I5181 Takotsubo syndrome: Secondary | ICD-10-CM

## 2022-10-19 DIAGNOSIS — I5022 Chronic systolic (congestive) heart failure: Secondary | ICD-10-CM

## 2022-10-19 DIAGNOSIS — E782 Mixed hyperlipidemia: Secondary | ICD-10-CM

## 2022-10-19 DIAGNOSIS — F17200 Nicotine dependence, unspecified, uncomplicated: Secondary | ICD-10-CM

## 2022-10-19 DIAGNOSIS — I2510 Atherosclerotic heart disease of native coronary artery without angina pectoris: Secondary | ICD-10-CM

## 2022-10-19 DIAGNOSIS — I739 Peripheral vascular disease, unspecified: Secondary | ICD-10-CM

## 2022-10-19 DIAGNOSIS — I252 Old myocardial infarction: Secondary | ICD-10-CM

## 2022-10-19 DIAGNOSIS — I10 Essential (primary) hypertension: Secondary | ICD-10-CM

## 2022-10-19 DIAGNOSIS — R7989 Other specified abnormal findings of blood chemistry: Secondary | ICD-10-CM

## 2022-10-19 DIAGNOSIS — Z79899 Other long term (current) drug therapy: Secondary | ICD-10-CM

## 2022-11-06 ENCOUNTER — Other Ambulatory Visit: Payer: Self-pay | Admitting: Physician Assistant

## 2022-11-06 DIAGNOSIS — I252 Old myocardial infarction: Secondary | ICD-10-CM

## 2022-11-06 DIAGNOSIS — I2510 Atherosclerotic heart disease of native coronary artery without angina pectoris: Secondary | ICD-10-CM

## 2022-11-06 DIAGNOSIS — I10 Essential (primary) hypertension: Secondary | ICD-10-CM

## 2022-11-06 DIAGNOSIS — I5022 Chronic systolic (congestive) heart failure: Secondary | ICD-10-CM

## 2022-11-06 DIAGNOSIS — E782 Mixed hyperlipidemia: Secondary | ICD-10-CM

## 2022-11-06 DIAGNOSIS — Z79899 Other long term (current) drug therapy: Secondary | ICD-10-CM

## 2022-11-06 DIAGNOSIS — F17200 Nicotine dependence, unspecified, uncomplicated: Secondary | ICD-10-CM

## 2022-11-06 DIAGNOSIS — I428 Other cardiomyopathies: Secondary | ICD-10-CM

## 2022-11-06 DIAGNOSIS — I5181 Takotsubo syndrome: Secondary | ICD-10-CM

## 2022-11-06 DIAGNOSIS — I739 Peripheral vascular disease, unspecified: Secondary | ICD-10-CM

## 2022-11-06 DIAGNOSIS — R7989 Other specified abnormal findings of blood chemistry: Secondary | ICD-10-CM

## 2022-12-11 ENCOUNTER — Other Ambulatory Visit: Payer: Self-pay | Admitting: Physician Assistant

## 2022-12-11 DIAGNOSIS — I5022 Chronic systolic (congestive) heart failure: Secondary | ICD-10-CM

## 2022-12-11 DIAGNOSIS — R7989 Other specified abnormal findings of blood chemistry: Secondary | ICD-10-CM

## 2022-12-11 DIAGNOSIS — I739 Peripheral vascular disease, unspecified: Secondary | ICD-10-CM

## 2022-12-11 DIAGNOSIS — I2510 Atherosclerotic heart disease of native coronary artery without angina pectoris: Secondary | ICD-10-CM

## 2022-12-11 DIAGNOSIS — I5181 Takotsubo syndrome: Secondary | ICD-10-CM

## 2022-12-11 DIAGNOSIS — E782 Mixed hyperlipidemia: Secondary | ICD-10-CM

## 2022-12-11 DIAGNOSIS — I10 Essential (primary) hypertension: Secondary | ICD-10-CM

## 2022-12-11 DIAGNOSIS — I428 Other cardiomyopathies: Secondary | ICD-10-CM

## 2022-12-11 DIAGNOSIS — Z79899 Other long term (current) drug therapy: Secondary | ICD-10-CM

## 2022-12-11 DIAGNOSIS — F17200 Nicotine dependence, unspecified, uncomplicated: Secondary | ICD-10-CM

## 2022-12-11 DIAGNOSIS — I252 Old myocardial infarction: Secondary | ICD-10-CM

## 2022-12-19 ENCOUNTER — Other Ambulatory Visit: Payer: Self-pay

## 2022-12-19 DIAGNOSIS — I10 Essential (primary) hypertension: Secondary | ICD-10-CM

## 2022-12-19 DIAGNOSIS — Z79899 Other long term (current) drug therapy: Secondary | ICD-10-CM

## 2022-12-19 DIAGNOSIS — R7989 Other specified abnormal findings of blood chemistry: Secondary | ICD-10-CM

## 2022-12-19 DIAGNOSIS — I252 Old myocardial infarction: Secondary | ICD-10-CM

## 2022-12-19 DIAGNOSIS — I428 Other cardiomyopathies: Secondary | ICD-10-CM

## 2022-12-19 DIAGNOSIS — F17200 Nicotine dependence, unspecified, uncomplicated: Secondary | ICD-10-CM

## 2022-12-19 DIAGNOSIS — I739 Peripheral vascular disease, unspecified: Secondary | ICD-10-CM

## 2022-12-19 DIAGNOSIS — I5181 Takotsubo syndrome: Secondary | ICD-10-CM

## 2022-12-19 DIAGNOSIS — I2510 Atherosclerotic heart disease of native coronary artery without angina pectoris: Secondary | ICD-10-CM

## 2022-12-19 DIAGNOSIS — I5022 Chronic systolic (congestive) heart failure: Secondary | ICD-10-CM

## 2022-12-19 DIAGNOSIS — E782 Mixed hyperlipidemia: Secondary | ICD-10-CM

## 2022-12-19 MED ORDER — CARVEDILOL 3.125 MG PO TABS
ORAL_TABLET | ORAL | 0 refills | Status: DC
Start: 2022-12-19 — End: 2023-01-25

## 2022-12-20 ENCOUNTER — Other Ambulatory Visit: Payer: Self-pay | Admitting: Physician Assistant

## 2022-12-20 DIAGNOSIS — I252 Old myocardial infarction: Secondary | ICD-10-CM

## 2022-12-20 DIAGNOSIS — F17200 Nicotine dependence, unspecified, uncomplicated: Secondary | ICD-10-CM

## 2022-12-20 DIAGNOSIS — Z79899 Other long term (current) drug therapy: Secondary | ICD-10-CM

## 2022-12-20 DIAGNOSIS — R7989 Other specified abnormal findings of blood chemistry: Secondary | ICD-10-CM

## 2022-12-20 DIAGNOSIS — I739 Peripheral vascular disease, unspecified: Secondary | ICD-10-CM

## 2022-12-20 DIAGNOSIS — I5022 Chronic systolic (congestive) heart failure: Secondary | ICD-10-CM

## 2022-12-20 DIAGNOSIS — I10 Essential (primary) hypertension: Secondary | ICD-10-CM

## 2022-12-20 DIAGNOSIS — I2510 Atherosclerotic heart disease of native coronary artery without angina pectoris: Secondary | ICD-10-CM

## 2022-12-20 DIAGNOSIS — I428 Other cardiomyopathies: Secondary | ICD-10-CM

## 2022-12-20 DIAGNOSIS — I5181 Takotsubo syndrome: Secondary | ICD-10-CM

## 2022-12-20 DIAGNOSIS — E782 Mixed hyperlipidemia: Secondary | ICD-10-CM

## 2023-01-01 ENCOUNTER — Other Ambulatory Visit: Payer: Self-pay | Admitting: Physician Assistant

## 2023-01-01 DIAGNOSIS — I739 Peripheral vascular disease, unspecified: Secondary | ICD-10-CM

## 2023-01-01 DIAGNOSIS — I5181 Takotsubo syndrome: Secondary | ICD-10-CM

## 2023-01-01 DIAGNOSIS — I10 Essential (primary) hypertension: Secondary | ICD-10-CM

## 2023-01-01 DIAGNOSIS — Z79899 Other long term (current) drug therapy: Secondary | ICD-10-CM

## 2023-01-01 DIAGNOSIS — I5022 Chronic systolic (congestive) heart failure: Secondary | ICD-10-CM

## 2023-01-01 DIAGNOSIS — E782 Mixed hyperlipidemia: Secondary | ICD-10-CM

## 2023-01-01 DIAGNOSIS — R7989 Other specified abnormal findings of blood chemistry: Secondary | ICD-10-CM

## 2023-01-01 DIAGNOSIS — I428 Other cardiomyopathies: Secondary | ICD-10-CM

## 2023-01-01 DIAGNOSIS — I252 Old myocardial infarction: Secondary | ICD-10-CM

## 2023-01-01 DIAGNOSIS — F17200 Nicotine dependence, unspecified, uncomplicated: Secondary | ICD-10-CM

## 2023-01-01 DIAGNOSIS — I2510 Atherosclerotic heart disease of native coronary artery without angina pectoris: Secondary | ICD-10-CM

## 2023-01-01 MED ORDER — PANTOPRAZOLE SODIUM 40 MG PO TBEC: 40.0000 mg | DELAYED_RELEASE_TABLET | Freq: Every day | ORAL | 0 refills | Status: DC

## 2023-01-01 MED ORDER — ENTRESTO 49-51 MG PO TABS
1.0000 | ORAL_TABLET | Freq: Two times a day (BID) | ORAL | 0 refills | Status: DC
Start: 2023-01-01 — End: 2023-01-25

## 2023-01-24 ENCOUNTER — Other Ambulatory Visit: Payer: Self-pay | Admitting: Physician Assistant

## 2023-01-24 DIAGNOSIS — I428 Other cardiomyopathies: Secondary | ICD-10-CM

## 2023-01-24 DIAGNOSIS — I10 Essential (primary) hypertension: Secondary | ICD-10-CM

## 2023-01-24 DIAGNOSIS — I5022 Chronic systolic (congestive) heart failure: Secondary | ICD-10-CM

## 2023-01-24 DIAGNOSIS — F17200 Nicotine dependence, unspecified, uncomplicated: Secondary | ICD-10-CM

## 2023-01-24 DIAGNOSIS — Z79899 Other long term (current) drug therapy: Secondary | ICD-10-CM

## 2023-01-24 DIAGNOSIS — I2510 Atherosclerotic heart disease of native coronary artery without angina pectoris: Secondary | ICD-10-CM

## 2023-01-24 DIAGNOSIS — I5181 Takotsubo syndrome: Secondary | ICD-10-CM

## 2023-01-24 DIAGNOSIS — E782 Mixed hyperlipidemia: Secondary | ICD-10-CM

## 2023-01-24 DIAGNOSIS — I252 Old myocardial infarction: Secondary | ICD-10-CM

## 2023-01-24 DIAGNOSIS — I739 Peripheral vascular disease, unspecified: Secondary | ICD-10-CM

## 2023-01-24 DIAGNOSIS — R7989 Other specified abnormal findings of blood chemistry: Secondary | ICD-10-CM

## 2023-01-31 ENCOUNTER — Telehealth: Payer: Self-pay | Admitting: Cardiology

## 2023-01-31 NOTE — Telephone Encounter (Signed)
Derick with [employer] is calling to give result    [facility] results done with pt today Result 0.61 left foot Result 0.48 in left foot    No other issues noted.

## 2023-02-06 ENCOUNTER — Ambulatory Visit: Payer: 59 | Admitting: Cardiology

## 2023-02-14 ENCOUNTER — Other Ambulatory Visit: Payer: Self-pay | Admitting: Physician Assistant

## 2023-02-14 DIAGNOSIS — I2510 Atherosclerotic heart disease of native coronary artery without angina pectoris: Secondary | ICD-10-CM

## 2023-02-14 DIAGNOSIS — F17200 Nicotine dependence, unspecified, uncomplicated: Secondary | ICD-10-CM

## 2023-02-14 DIAGNOSIS — I5181 Takotsubo syndrome: Secondary | ICD-10-CM

## 2023-02-14 DIAGNOSIS — I10 Essential (primary) hypertension: Secondary | ICD-10-CM

## 2023-02-14 DIAGNOSIS — R7989 Other specified abnormal findings of blood chemistry: Secondary | ICD-10-CM

## 2023-02-14 DIAGNOSIS — Z79899 Other long term (current) drug therapy: Secondary | ICD-10-CM

## 2023-02-14 DIAGNOSIS — I5022 Chronic systolic (congestive) heart failure: Secondary | ICD-10-CM

## 2023-02-14 DIAGNOSIS — I428 Other cardiomyopathies: Secondary | ICD-10-CM

## 2023-02-14 DIAGNOSIS — I739 Peripheral vascular disease, unspecified: Secondary | ICD-10-CM

## 2023-02-14 DIAGNOSIS — E782 Mixed hyperlipidemia: Secondary | ICD-10-CM

## 2023-02-14 DIAGNOSIS — I252 Old myocardial infarction: Secondary | ICD-10-CM

## 2023-03-12 ENCOUNTER — Other Ambulatory Visit: Payer: Self-pay | Admitting: Cardiology

## 2023-03-12 DIAGNOSIS — F17200 Nicotine dependence, unspecified, uncomplicated: Secondary | ICD-10-CM

## 2023-03-12 DIAGNOSIS — E782 Mixed hyperlipidemia: Secondary | ICD-10-CM

## 2023-03-12 DIAGNOSIS — I5181 Takotsubo syndrome: Secondary | ICD-10-CM

## 2023-03-12 DIAGNOSIS — I428 Other cardiomyopathies: Secondary | ICD-10-CM

## 2023-03-12 DIAGNOSIS — I10 Essential (primary) hypertension: Secondary | ICD-10-CM

## 2023-03-12 DIAGNOSIS — I739 Peripheral vascular disease, unspecified: Secondary | ICD-10-CM

## 2023-03-12 DIAGNOSIS — R7989 Other specified abnormal findings of blood chemistry: Secondary | ICD-10-CM

## 2023-03-12 DIAGNOSIS — I2510 Atherosclerotic heart disease of native coronary artery without angina pectoris: Secondary | ICD-10-CM

## 2023-03-12 DIAGNOSIS — I252 Old myocardial infarction: Secondary | ICD-10-CM

## 2023-03-12 DIAGNOSIS — Z79899 Other long term (current) drug therapy: Secondary | ICD-10-CM

## 2023-03-12 DIAGNOSIS — I5022 Chronic systolic (congestive) heart failure: Secondary | ICD-10-CM

## 2023-03-13 ENCOUNTER — Ambulatory Visit: Payer: 59 | Attending: Cardiology | Admitting: Cardiology

## 2023-03-13 ENCOUNTER — Encounter: Payer: Self-pay | Admitting: Cardiology

## 2023-03-13 VITALS — BP 112/62 | HR 63 | Ht 73.0 in | Wt 175.8 lb

## 2023-03-13 DIAGNOSIS — F17200 Nicotine dependence, unspecified, uncomplicated: Secondary | ICD-10-CM

## 2023-03-13 DIAGNOSIS — E782 Mixed hyperlipidemia: Secondary | ICD-10-CM

## 2023-03-13 DIAGNOSIS — I5032 Chronic diastolic (congestive) heart failure: Secondary | ICD-10-CM

## 2023-03-13 DIAGNOSIS — F17210 Nicotine dependence, cigarettes, uncomplicated: Secondary | ICD-10-CM | POA: Diagnosis not present

## 2023-03-13 DIAGNOSIS — I10 Essential (primary) hypertension: Secondary | ICD-10-CM

## 2023-03-13 DIAGNOSIS — I2510 Atherosclerotic heart disease of native coronary artery without angina pectoris: Secondary | ICD-10-CM | POA: Diagnosis not present

## 2023-03-13 DIAGNOSIS — I252 Old myocardial infarction: Secondary | ICD-10-CM | POA: Diagnosis not present

## 2023-03-13 MED ORDER — ENTRESTO 49-51 MG PO TABS: 1.0000 | ORAL_TABLET | Freq: Two times a day (BID) | ORAL | 3 refills | Status: DC

## 2023-03-13 NOTE — Patient Instructions (Signed)
 Medication Instructions:  Your physician recommends that you continue on your current medications as directed. Please refer to the Current Medication list given to you today.  Refill of Sherryll Burger has been sent in to your pharmacy.  *If you need a refill on your cardiac medications before your next appointment, please call your pharmacy*  Lab Work: None ordered today. If you have labs (blood work) drawn today and your tests are completely normal, you will receive your results only by: MyChart Message (if you have MyChart) OR A paper copy in the mail If you have any lab test that is abnormal or we need to change your treatment, we will call you to review the results.  Testing/Procedures: None ordered today.  Follow-Up: At [facility], you and your health needs are our priority.  As part of our continuing mission to provide you with exceptional heart care, we have created designated Provider Care Teams.  These Care Teams include your primary Cardiologist (physician) and Advanced Practice Providers (APPs -  Physician Assistants and Nurse Practitioners) who all work together to provide you with the care you need, when you need it.  We recommend signing up for the patient portal called "MyChart".  Sign up information is provided on this After Visit Summary.  MyChart is used to connect with patients for Virtual Visits (Telemedicine).  Patients are able to view lab/test results, encounter notes, upcoming appointments, etc.  Non-urgent messages can be sent to your provider as well.   To learn more about what you can do with MyChart, go to [email or web address].    Your next appointment:   1 year(s)  The format for your next appointment:   In Person  Provider:   Tessa Lerner, DO {

## 2023-03-13 NOTE — Progress Notes (Signed)
 Cardiology Office Note:  .   Date:  03/13/2023  ID:  Andres Boyd, DOB Apr 20, 1955, MRN [id number] PCP:  Tiffany Kocher, DO  Former Cardiology Providers: Dr. Delton See, for change EXTR thank you Dr. Lowella [facility] Providers Cardiologist:  Tessa Lerner, DO , [facility] (established care 03/13/23) Electrophysiologist:  None  Click to update primary MD,subspecialty MD or APP then REFRESH:1}    Chief Complaint  Patient presents with   Follow-up    Annual visit, heart failure with improved EF    History of Present Illness: .   Andres Boyd is a 68 y.o. African-American male whose past medical history and cardiovascular risk factors includes: Heart failure with improved EF, recovered nonischemic cardiomyopathy, probable PAD by duplex 03/2017, nonobstructive CAD, alcohol abuse, sinus bradycardia, tobacco abuse, HTN, HLD, hyponatremia, CKD III.  Formally under the care of Dr. Shari Prows who last saw Andres Boyd back in July 2023. I am seeing him for the first time to re-establishing care.   From Dr. Devin Going note: Per review of the record, the patient has a history of NSTEMI in 07/2015 with peak troponin of troponin 2. He underwent cardiac cath and only had mild plaque in mildly reduced EF, felt to represent Takatsubo variant vs coronary spasm - medical management recommended. EF was 40-45%. He was readmitted to [facility] 7/18 for chest pain and elevated troponin. At that time he was drinking > 8 ETOH beverages each night. UDS was + for marijuana and opioids. Nuclear stress test was abnormal so he underwent cath showing mild nonobstructive CAD, EF was normal at 50-55% with some anterolateral hypokinesis. LVEDP was normal. He was readmitted 09/2016 with severe midsternal chest pain - initially sent home from ER for negative troponin then returned with persistent symptoms and troponin eventually peaking at 3.42. D-dimer was negative. Repeat cath 09/06/16 showed no change to mild CAD from 07/2016. Echo showed  significant drop in EF since July (50-55% -> 25-30%), with cath showing EF 35-40%. cMRI with LGE uptake was suggestive of myocarditis vs infiltrative CMP. HIV, hep screen and iron studies were normal. Dr. Gala Romney evaluated the patient. PYP scan without evidence of TTR amyloid. 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 TTE 03/2017 showed EF 60-65%, grade 1 DD, normal wall motion, normal RV. Repeat echocardiogram in 2020 showed decrease in LVEF to 45 to 50%, the patient was started on Entresto.   Patient denies anginal chest pain, orthopnea, PND, or lower extremity swelling.  He does have shortness of breath predominantly with effort related activities which is chronic and stable.  Patient has been attributing that to his chronic smoking which is likely contributory.  He continues to smoke at least 0.5 packs/day and consumes 2-3 beers regularly.  Patient states that he has an appointment with PCP to have his labs rechecked including cholesterol.  Review of Systems: .   Review of Systems  Cardiovascular:  Positive for dyspnea on exertion (chronic /stable - pt thinks its due to smoking 0.5ppd). Negative for chest pain, claudication, irregular heartbeat, leg swelling, near-syncope, orthopnea, palpitations, paroxysmal nocturnal dyspnea and syncope.  Respiratory:  Positive for shortness of breath.   Hematologic/Lymphatic: Negative for bleeding problem.    Studies Reviewed:   EKG: EKG Interpretation Date/Time:  Tuesday March 13 2023 09:13:50 EDT Ventricular Rate:  63 PR Interval:  220 QRS Duration:  86 QT Interval:  414 QTC Calculation: 423 R Axis:   67  Text Interpretation: Sinus rhythm with 1st  degree A-V block When compared with ECG of 29-Dec-2018 21:59, No significant change was found Confirmed by Tessa Lerner [id number]) on 03/13/2023 9:31:36 AM  Echocardiogram: 07/22/2021 LVEF 50-55%, no regional wall motion abnormalities,  diastolic function reported to be normal, RV size and function normal, no significant valvular heart disease.  Heart catheterization: 09/2016 1. Mild nonobstructive CAD with no changes in coronary anatomy compared with recent cath 2. Moderately severe segmental LV systolic dysfunction with normal periapical wall motion and severe hypokinesis/akinesis of the basal LV segments, LVEF estimated at 35-40%   Suspect non-coronary cause of elevated troponin. Consider acute myocarditis or atypical stress cardiomyopathy (seems less likely).   Cardiac MRI 09/07/2016 1) Moderate LVE with global hypokinesis, abnormal septal motion and some apical sparing EF 36%   2) Abnormal delayed gadolinium uptake in basal and mid anterior wall, septum and apex Mid myocardial uptake suggests myocarditis or infiltrative cardiomyopathy   3) Normal RV size and function   4) No pericardial effusion  RADIOLOGY: NA  Risk Assessment/Calculations:   N/A   Labs:       Latest Ref Rng & Units 03/08/2021    7:34 AM 08/13/2019    9:28 AM 12/31/2018    1:56 AM  CBC  WBC 3.4 - 10.8 x10E3/uL  4.5  9.4   Hemoglobin 13.0 - 17.0 g/dL 21.3  08.6  57.8   Hematocrit 39.0 - 52.0 % 42.0  41.1  36.2   Platelets 150 - 450 x10E3/uL  262  240        Latest Ref Rng & Units 03/23/2022    5:08 PM 03/08/2021    7:34 AM 12/17/2019   12:11 PM  BMP  Glucose 70 - 99 mg/dL 94  469  91   BUN 8 - 27 mg/dL 15  15  19    Creatinine 0.76 - 1.27 mg/dL 6.29  5.28  4.13   BUN/Creat Ratio 10 - 24 13   16    Sodium 134 - 144 mmol/L 141  142  139   Potassium 3.5 - 5.2 mmol/L 4.7  3.9  4.7   Chloride 96 - 106 mmol/L 101  104  101   CO2 20 - 29 mmol/L 26   22   Calcium 8.6 - 10.2 mg/dL 24.4   9.9       Latest Ref Rng & Units 03/23/2022    5:08 PM 03/08/2021    7:34 AM 12/17/2019   12:11 PM  CMP  Glucose 70 - 99 mg/dL 94  010  91   BUN 8 - 27 mg/dL 15  15  19    Creatinine 0.76 - 1.27 mg/dL 2.72  5.36  6.44   Sodium 134 - 144 mmol/L 141  142   139   Potassium 3.5 - 5.2 mmol/L 4.7  3.9  4.7   Chloride 96 - 106 mmol/L 101  104  101   CO2 20 - 29 mmol/L 26   22   Calcium 8.6 - 10.2 mg/dL 03.4   9.9   Total Protein 6.0 - 8.5 g/dL 8.4     Total Bilirubin 0.0 - 1.2 mg/dL 0.2     Alkaline Phos 44 - 121 IU/L 69     AST 0 - 40 IU/L 34     ALT 0 - 44 IU/L 35       Lab Results  Component Value Date   CHOL 141 06/28/2021   HDL 46 06/28/2021   LDLCALC 72 06/28/2021   TRIG 133 06/28/2021  CHOLHDL 3.1 06/28/2021   No results for input(s): "LIPOA" in the last 8760 hours. No components found for: "NTPROBNP" No results for input(s): "PROBNP" in the last 8760 hours. No results for input(s): "TSH" in the last 8760 hours.  No new labs available for review.  Physical Exam:    Today's Vitals   03/13/23 0915  BP: 112/62  Pulse: 63  SpO2: 97%  Weight: 175 lb 12.8 oz (79.7 kg)  Height: 6\' 1"  (1.854 m)   Body mass index is 23.19 kg/m. Wt Readings from Last 3 Encounters:  03/13/23 175 lb 12.8 oz (79.7 kg)  03/23/22 197 lb (89.4 kg)  07/18/21 195 lb (88.5 kg)    Physical Exam  Constitutional: No distress.  hemodynamically stable  Neck: No JVD present.  Cardiovascular: Normal rate, regular rhythm, S1 normal and S2 normal. Exam reveals no gallop, no S3 and no S4.  No murmur heard. Pulmonary/Chest: Effort normal. No stridor. He has no wheezes. He has no rales.  Decreased breath sounds bilaterally.  Musculoskeletal:        General: No edema.     Cervical back: Neck supple.  Skin: Skin is warm.   Impression & Recommendation(s):  Impression:   ICD-10-CM   1. Heart failure with improved ejection fraction (HFimpEF) (HCC)  I50.32 sacubitril-valsartan (ENTRESTO) 49-51 MG    2. History of non-ST elevation myocardial infarction (NSTEMI)  I25.2 EKG 12-Lead    3. Coronary artery disease involving native coronary artery of native heart without angina pectoris  I25.10     4. Mixed hyperlipidemia  E78.2     5. Essential hypertension   I10     6. Tobacco use disorder  F17.200        Recommendation(s):  Heart failure with improved ejection fraction (HFimpEF) (HCC) Recovered nonischemic cardiomyopathy Echo 09/2016: 25-30% Echo 03/2017: 60-65%. Continue Entresto 49/51 mg p.o. twice daily, refill provided. Continue spironolactone 25 mg p.o. every morning. Continue carvedilol 3.125 mg p.o. twice daily. Reemphasized importance of low-salt diet.  History of non-ST elevation myocardial infarction (NSTEMI) Coronary artery disease involving native coronary artery of native heart without angina pectoris History of nonobstructive disease No use of sublingual nitroglycerin tablets since the last office visit. Continue aspirin 81 mg p.o. daily. Continue Lipitor 20 mg p.o. nightly.  Mixed hyperlipidemia Currently on Lipitor 20 mg p.o. daily.   He denies myalgia or other side effects. Most recent lipids dated June 2023 [facility] database, independently reviewed as noted above.  LDL is 72 mg/dL.  Patient plans to follow-up with PCP in the coming weeks to have his lipids along with other blood work checked.  He will send [place] a copy for reference.  Essential hypertension Office blood pressures are soft but patient does not endorse any evidence or symptoms of orthostasis. Medications as discussed above. Reemphasized the importance of changing positions slowly.  Tobacco use disorder Tobacco cessation counseling: Currently smoking 0.5 packs/day   Patient is informed to follow-up with PCP and consider lung cancer screening if and when appropriate. He is informed of the dangers of tobacco abuse including stroke, cancer, and MI, as well as benefits of tobacco cessation. He is willing to quit at this time. 5  mins were spent counseling patient cessation techniques. We discussed various methods to help quit smoking, including deciding on a date to quit, joining a support group, pharmacological agents- nicotine gum/patch/lozenges.  Patient  states that he is considering restarting Chantix he will discuss with PCP in the coming weeks.  Orders  Placed:  Orders Placed This Encounter  Procedures   EKG 12-Lead   Total time spent: 45 minutes.  Management of at least 2 chronic comorbid morbid conditions.  Seeing the patient for the first time to reestablish care.  Last progress note from Dr. Shari Prows dated 07/18/2021 reviewed, echocardiogram from 07/22/2021 reviewed, cardiac MRI results from September 2018 reviewed, medications reconciled, prescription management, coordination of care, educated on importance of improving his modifiable cardiovascular risk factors including smoking cessation.  Final Medication List:    Meds ordered this encounter  Medications   sacubitril-valsartan (ENTRESTO) 49-51 MG    Sig: Take 1 tablet by mouth 2 (two) times daily.    Dispense:  180 tablet    Refill:  3    Pt needs to keep upcoming March appt for further refills. Thanks    Medications Discontinued During This Encounter  Medication Reason   sacubitril-valsartan (ENTRESTO) 49-51 MG Reorder     Current Outpatient Medications:    Ascorbic Acid (VITAMIN C PO), Take by mouth daily., Disp: , Rfl:    aspirin EC 81 MG tablet, Take 81 mg by mouth daily. Swallow whole., Disp: , Rfl:    atorvastatin (LIPITOR) 20 MG tablet, TAKE 1 TABLET(20 MG) BY MOUTH DAILY, Disp: 30 tablet, Rfl: 0   carvedilol (COREG) 3.125 MG tablet, TAKE 1 TABLET(3.125 MG) BY MOUTH TWICE DAILY, Disp: 60 tablet, Rfl: 0   Multiple Vitamins-Minerals (CENTRUM ADULTS PO), Take by mouth. Takes most days, Disp: , Rfl:    nitroGLYCERIN (NITROSTAT) 0.4 MG SL tablet, Place 1 tablet (0.4 mg total) under the tongue every 5 (five) minutes as needed for chest pain., Disp: 90 tablet, Rfl: 3   pantoprazole (PROTONIX) 40 MG tablet, TAKE 1 TABLET(40 MG) BY MOUTH DAILY, Disp: 30 tablet, Rfl: 0   spironolactone (ALDACTONE) 25 MG tablet, Take 1 tablet (25 mg total) by mouth daily., Disp: 30 tablet, Rfl:  0   tamsulosin (FLOMAX) 0.4 MG CAPS capsule, SMARTSIG:1 Capsule(s) By Mouth Every Evening, Disp: , Rfl:    albuterol (VENTOLIN HFA) 108 (90 Base) MCG/ACT inhaler, 1 puff every 4 (four) hours as needed. (Patient not taking: Reported on 03/27/2022), Disp: , Rfl:    nicotine polacrilex (COMMIT) 4 MG lozenge, Take 1 lozenge by mouth as needed. (Patient not taking: Reported on 03/13/2023), Disp: , Rfl:    sacubitril-valsartan (ENTRESTO) 49-51 MG, Take 1 tablet by mouth 2 (two) times daily., Disp: 180 tablet, Rfl: 3   varenicline (CHANTIX) 0.5 MG tablet, Take 1 tablet (0.5 mg total) by mouth 2 (two) times daily with a meal. Take once daily with food for 1 week THEN increase to BID with meals (Patient not taking: Reported on 03/13/2023), Disp: 60 tablet, Rfl: 5  Consent:   NA  Disposition:   1 year follow-up sooner if needed Patient may be asked to follow-up sooner based on the results of the above-mentioned testing.  His questions and concerns were addressed to his satisfaction. He voices understanding of the recommendations provided during this encounter.    Signed, Tessa Lerner, DO, [facility]  [facility]  [address] [place], [place] [address] 03/13/2023 10:44 AM

## 2023-03-27 ENCOUNTER — Other Ambulatory Visit: Payer: Self-pay

## 2023-03-27 ENCOUNTER — Other Ambulatory Visit (HOSPITAL_COMMUNITY): Payer: Self-pay

## 2023-03-27 ENCOUNTER — Other Ambulatory Visit: Payer: Self-pay | Admitting: Cardiology

## 2023-03-27 DIAGNOSIS — I5181 Takotsubo syndrome: Secondary | ICD-10-CM

## 2023-03-27 DIAGNOSIS — Z79899 Other long term (current) drug therapy: Secondary | ICD-10-CM

## 2023-03-27 DIAGNOSIS — I252 Old myocardial infarction: Secondary | ICD-10-CM

## 2023-03-27 DIAGNOSIS — E782 Mixed hyperlipidemia: Secondary | ICD-10-CM

## 2023-03-27 DIAGNOSIS — I428 Other cardiomyopathies: Secondary | ICD-10-CM

## 2023-03-27 DIAGNOSIS — I10 Essential (primary) hypertension: Secondary | ICD-10-CM

## 2023-03-27 DIAGNOSIS — R7989 Other specified abnormal findings of blood chemistry: Secondary | ICD-10-CM

## 2023-03-27 DIAGNOSIS — I2510 Atherosclerotic heart disease of native coronary artery without angina pectoris: Secondary | ICD-10-CM

## 2023-03-27 DIAGNOSIS — I5022 Chronic systolic (congestive) heart failure: Secondary | ICD-10-CM

## 2023-03-27 DIAGNOSIS — I739 Peripheral vascular disease, unspecified: Secondary | ICD-10-CM

## 2023-03-27 DIAGNOSIS — F17200 Nicotine dependence, unspecified, uncomplicated: Secondary | ICD-10-CM

## 2023-03-27 MED ORDER — PANTOPRAZOLE SODIUM 40 MG PO TBEC
40.0000 mg | DELAYED_RELEASE_TABLET | Freq: Every day | ORAL | 3 refills | Status: DC
  Filled 2023-03-27: qty 90, 90d supply, fill #0

## 2023-03-29 MED ORDER — PANTOPRAZOLE SODIUM 40 MG PO TBEC: 40.0000 mg | DELAYED_RELEASE_TABLET | Freq: Every day | ORAL | 3 refills | Status: AC

## 2023-03-29 NOTE — Addendum Note (Signed)
 Addended by: Adriana Simas, Neshawn Aird L on: 03/29/2023 02:13 PM   Modules accepted: Orders

## 2023-03-30 ENCOUNTER — Other Ambulatory Visit (HOSPITAL_COMMUNITY): Payer: Self-pay

## 2023-04-04 ENCOUNTER — Other Ambulatory Visit: Payer: Self-pay | Admitting: Cardiology

## 2023-04-04 DIAGNOSIS — R7989 Other specified abnormal findings of blood chemistry: Secondary | ICD-10-CM

## 2023-04-04 DIAGNOSIS — I10 Essential (primary) hypertension: Secondary | ICD-10-CM

## 2023-04-04 DIAGNOSIS — Z79899 Other long term (current) drug therapy: Secondary | ICD-10-CM

## 2023-04-04 DIAGNOSIS — I739 Peripheral vascular disease, unspecified: Secondary | ICD-10-CM

## 2023-04-04 DIAGNOSIS — E782 Mixed hyperlipidemia: Secondary | ICD-10-CM

## 2023-04-04 DIAGNOSIS — I5181 Takotsubo syndrome: Secondary | ICD-10-CM

## 2023-04-04 DIAGNOSIS — I5022 Chronic systolic (congestive) heart failure: Secondary | ICD-10-CM

## 2023-04-04 DIAGNOSIS — F17200 Nicotine dependence, unspecified, uncomplicated: Secondary | ICD-10-CM

## 2023-04-04 DIAGNOSIS — I2510 Atherosclerotic heart disease of native coronary artery without angina pectoris: Secondary | ICD-10-CM

## 2023-04-04 DIAGNOSIS — I252 Old myocardial infarction: Secondary | ICD-10-CM

## 2023-04-04 DIAGNOSIS — I428 Other cardiomyopathies: Secondary | ICD-10-CM

## 2023-04-19 ENCOUNTER — Other Ambulatory Visit: Payer: Self-pay | Admitting: Cardiology

## 2023-04-19 DIAGNOSIS — I2510 Atherosclerotic heart disease of native coronary artery without angina pectoris: Secondary | ICD-10-CM

## 2023-04-19 DIAGNOSIS — Z79899 Other long term (current) drug therapy: Secondary | ICD-10-CM

## 2023-04-19 DIAGNOSIS — I5022 Chronic systolic (congestive) heart failure: Secondary | ICD-10-CM

## 2023-04-19 DIAGNOSIS — F17200 Nicotine dependence, unspecified, uncomplicated: Secondary | ICD-10-CM

## 2023-04-19 DIAGNOSIS — E782 Mixed hyperlipidemia: Secondary | ICD-10-CM

## 2023-04-19 DIAGNOSIS — I10 Essential (primary) hypertension: Secondary | ICD-10-CM

## 2023-04-19 DIAGNOSIS — I5181 Takotsubo syndrome: Secondary | ICD-10-CM

## 2023-04-19 DIAGNOSIS — I252 Old myocardial infarction: Secondary | ICD-10-CM

## 2023-04-19 DIAGNOSIS — R7989 Other specified abnormal findings of blood chemistry: Secondary | ICD-10-CM

## 2023-04-19 DIAGNOSIS — I428 Other cardiomyopathies: Secondary | ICD-10-CM

## 2023-04-19 DIAGNOSIS — I739 Peripheral vascular disease, unspecified: Secondary | ICD-10-CM

## 2023-04-30 ENCOUNTER — Ambulatory Visit: Payer: 59

## 2023-04-30 VITALS — Ht 72.0 in | Wt 187.0 lb

## 2023-04-30 DIAGNOSIS — Z0000 Encounter for general adult medical examination without abnormal findings: Secondary | ICD-10-CM

## 2023-04-30 NOTE — Patient Instructions (Signed)
 Mr. Andres Boyd , Thank you for taking time to come for your Medicare Wellness Visit. I appreciate your ongoing commitment to your health goals. Please review the following plan we discussed and let me know if I can assist you in the future.   Referrals/Orders/Follow-Ups/Clinician Recommendations: Yes, keep maintaining your health by keeping your appointments with Dr. Lavada Porteous and any specialists that you may see.  Call us  if you need anything.  Have a great year!!!!  This is a list of the screening recommended for you and due dates:  Health Maintenance  Topic Date Due   Colon Cancer Screening  Never done   COVID-19 Vaccine (4 - 2024-25 season) 09/03/2022   Screening for Lung Cancer  07/24/2023   Flu Shot  08/03/2023   Medicare Annual Wellness Visit  04/29/2024   DTaP/Tdap/Td vaccine (2 - Td or Tdap) 12/30/2026   Pneumonia Vaccine  Completed   Hepatitis C Screening  Completed   Zoster (Shingles) Vaccine  Completed   HPV Vaccine  Aged Out   Meningitis B Vaccine  Aged Out    Advanced directives: (Copy Requested) Please bring a copy of your health care power of attorney and living will to the office to be added to your chart at your convenience. You can mail to [facility] [address] [place], [place] [address] or email to [email or web address]  Next Medicare Annual Wellness Visit scheduled for next year: Yes

## 2023-04-30 NOTE — Progress Notes (Signed)
 Because this visit was a virtual/telehealth visit,  certain criteria was not obtained, such a blood pressure, CBG if applicable, and timed get up and go. Any medications not marked as "taking" were not mentioned during the medication reconciliation part of the visit. Any vitals not documented were not able to be obtained due to this being a telehealth visit or patient was unable to self-report a recent blood pressure reading due to a lack of equipment at home via telehealth. Vitals that have been documented are verbally provided by the patient.   Subjective:   Andres Boyd is a 68 y.o. who presents for a Medicare Wellness preventive visit.  Visit Complete: Virtual I connected with  Andres Boyd on 04/30/23 by a audio enabled telemedicine application and verified that I am speaking with the correct person using two identifiers.  Patient Location: Home  Provider Location: Office/Clinic  I discussed the limitations of evaluation and management by telemedicine. The patient expressed understanding and agreed to proceed.  Vital Signs: Because this visit was a virtual/telehealth visit, some criteria may be missing or patient reported. Any vitals not documented were not able to be obtained and vitals that have been documented are patient reported.  VideoDeclined- This patient declined Librarian, academic. Therefore the visit was completed with audio only.  Persons Participating in Visit: Patient.  AWV Questionnaire: No: Patient Medicare AWV questionnaire was not completed prior to this visit.  Cardiac Risk Factors include: advanced age (>29men, >23 women);sedentary lifestyle;dyslipidemia;family history of premature cardiovascular disease;hypertension;male gender;smoking/ tobacco exposure     Objective:    Today's Vitals   04/30/23 0952  Weight: 187 lb (84.8 kg)  Height: 6' (1.829 m)  PainSc: 0-No pain   Body mass index is 25.36 kg/m.     04/30/2023    9:55 AM  03/27/2022    1:56 PM 03/23/2022    2:48 PM 06/28/2021    2:10 PM 06/21/2021    2:37 PM 03/08/2021    7:01 AM 10/01/2020   10:13 AM  Advanced Directives  Does Patient Have a Medical Advance Directive? Yes No No No No No No  Type of Estate agent of [place];Living will        Copy of Healthcare Power of Attorney in Chart? No - copy requested        Would patient like information on creating a medical advance directive?  Yes (MAU/Ambulatory/Procedural Areas - Information given) No - Patient declined No - Patient declined  No - Patient declined     Current Medications (verified) Outpatient Encounter Medications as of 04/30/2023  Medication Sig   albuterol (VENTOLIN HFA) 108 (90 Base) MCG/ACT inhaler 1 puff every 4 (four) hours as needed. (Patient not taking: Reported on 03/27/2022)   Ascorbic Acid (VITAMIN C PO) Take by mouth daily.   aspirin  EC 81 MG tablet Take 81 mg by mouth daily. Swallow whole.   atorvastatin  (LIPITOR) 20 MG tablet Take 1 tablet (20 mg total) by mouth daily.   carvedilol  (COREG ) 3.125 MG tablet TAKE 1 TABLET(3.125 MG) BY MOUTH TWICE DAILY   Multiple Vitamins-Minerals (CENTRUM ADULTS PO) Take by mouth. Takes most days   nicotine  polacrilex (COMMIT) 4 MG lozenge Take 1 lozenge by mouth as needed. (Patient not taking: Reported on 03/13/2023)   nitroGLYCERIN  (NITROSTAT ) 0.4 MG SL tablet Place 1 tablet (0.4 mg total) under the tongue every 5 (five) minutes as needed for chest pain.   pantoprazole  (PROTONIX ) 40 MG tablet Take 1 tablet (40  mg total) by mouth daily.   sacubitril -valsartan  (ENTRESTO ) 49-51 MG Take 1 tablet by mouth 2 (two) times daily.   spironolactone  (ALDACTONE ) 25 MG tablet TAKE 1 TABLET(25 MG) BY MOUTH DAILY   tamsulosin (FLOMAX) 0.4 MG CAPS capsule SMARTSIG:1 Capsule(s) By Mouth Every Evening   varenicline  (CHANTIX ) 0.5 MG tablet Take 1 tablet (0.5 mg total) by mouth 2 (two) times daily with a meal. Take once daily with food for 1 week THEN  increase to BID with meals (Patient not taking: Reported on 03/13/2023)   No facility-administered encounter medications on file as of 04/30/2023.    Allergies (verified) Patient has no known allergies.   History: Past Medical History:  Diagnosis Date   1st degree AV block    Acute appendicitis 12/30/2018   Chronic systolic CHF (congestive heart failure) (HCC)    followed by cardiology   CKD (chronic kidney disease), stage III (HCC)    COPD (chronic obstructive pulmonary disease) (HCC)    ED (erectile dysfunction)    ETOH abuse    GERD (gastroesophageal reflux disease)    History of non-ST elevation myocardial infarction (NSTEMI) 07/2015   History of viral myocarditis 07/2016   per cardiology possibly from alcohol , resolved   Hyperlipidemia    Hypertension    Hyponatremia    Lung nodule, multiple 05/2020   pulmonology--- dr byrum   Malignant neoplasm prostate [facility])    urologist--- dr bell /  radiation oncology--- dr Lorri Rota;;   dx 06/ 2022, Gleason 4+3, PSA 40.7   Mild CAD    cardiologist--- dr Linnell Richardson. nelson/ dr bensimhon;   hx NSTEMI , cardiac cath 07-19-2015  for chest pain/ elevated troponin's,  mild non-obstructive cad w/ ef 40-45%,  felt to be takatsubo varient versus coronary spasm;  cath 07-04-2016 for chest pain,  no change mild cad, ef 35-40%   NICM (nonischemic cardiomyopathy) (HCC)    07/ 2018  per echo ef 25-30% and per cardiac cath ef 35-40%;;   last echo in epic 10-28-2018 ef 45-50%   PAD (peripheral artery disease) (HCC)    a. LE vasc testing 03/2017 - LE duplex for claudication 03/2017 showing 30-49% stenosis in R popliteal and total occlusion in posterior tibial. Managed medically.   Prolonged QT interval    Sinus bradycardia    Past Surgical History:  Procedure Laterality Date   CARDIAC CATHETERIZATION N/A 07/19/2015   Procedure: Left Heart Cath and Coronary Angiography;  Surgeon: Lucendia Rusk, MD;  Location: [facility] INVASIVE CV LAB;  Service: Cardiovascular;   Laterality: N/A;   GOLD SEED IMPLANT N/A 03/08/2021   Procedure: GOLD SEED IMPLANT;  Surgeon: Sherlyn Ditto, MD;  Location: [facility];  Service: Urology;  Laterality: N/A;  30 MINS FOR CASE   LAPAROSCOPIC APPENDECTOMY N/A 12/30/2018   Procedure: APPENDECTOMY LAPAROSCOPIC;  Surgeon: Kinsinger, Alphonso Aschoff, MD;  Location: [facility] OR;  Service: General;  Laterality: N/A;   LAPAROSCOPIC LYSIS OF ADHESIONS N/A 12/30/2018   Procedure: LAPAROSCOPIC LYSIS OF ADHESIONS;  Surgeon: Dorrie Gaudier Alphonso Aschoff, MD;  Location: [facility] OR;  Service: General;  Laterality: N/A;   LEFT HEART CATH AND CORONARY ANGIOGRAPHY N/A 07/04/2016   Procedure: Left Heart Cath and Coronary Angiography;  Surgeon: Lucendia Rusk, MD;  Location: [facility] INVASIVE CV LAB;  Service: Cardiovascular;  Laterality: N/A;   LEFT HEART CATH AND CORONARY ANGIOGRAPHY N/A 09/06/2016   Procedure: LEFT HEART CATH AND CORONARY ANGIOGRAPHY;  Surgeon: Arnoldo Lapping, MD;  Location: [facility] INVASIVE CV LAB;  Service:  Cardiovascular;  Laterality: N/A;   SPACE OAR INSTILLATION N/A 03/08/2021   Procedure: SPACE OAR INSTILLATION;  Surgeon: Sherlyn Ditto, MD;  Location: [facility];  Service: Urology;  Laterality: N/A;   Family History  Problem Relation Age of Onset   Diabetes Mother    Hypertension Mother    Diabetes Father    Diabetes Maternal Grandmother    Hypertension Maternal Grandmother    Diabetes Maternal Grandfather    Hypertension Maternal Grandfather    Colon cancer Neg Hx    Colon polyps Neg Hx    Esophageal cancer Neg Hx    Rectal cancer Neg Hx    Stomach cancer Neg Hx    Social History   Socioeconomic History   Marital status: Single    Spouse name: Not on file   Number of children: 2   Years of education: Not on file   Highest education level: Not on file  Occupational History   Occupation: Disability  Tobacco Use   Smoking status: Every Day    Current packs/day: 0.50    Average packs/day: 0.5  packs/day for 51.3 years (25.7 ttl pk-yrs)    Types: Cigarettes    Start date: 01/1972   Smokeless tobacco: Never   Tobacco comments:    Hx of 1 ppd smoker during adult life.   Vaping Use   Vaping status: Never Used  Substance and Sexual Activity   Alcohol use: Not Currently    Alcohol/week: 9.0 standard drinks of alcohol    Types: 9 Cans of beer per week   Drug use: Yes    Types: Marijuana    Comment: 03-03-2021  per pt smokes daily   Sexual activity: Not on file  Other Topics Concern   Not on file  Social History Narrative   Patient unemployed. Lives in [place] with son.    Social Drivers of Corporate investment banker Strain: Low Risk  (04/30/2023)   Overall Financial Resource Strain (CARDIA)    Difficulty of Paying Living Expenses: Not hard at all  Food Insecurity: No Food Insecurity (04/30/2023)   Hunger Vital Sign    Worried About Running Out of Food in the Last Year: Never true    Ran Out of Food in the Last Year: Never true  Transportation Needs: No Transportation Needs (04/30/2023)   PRAPARE - Administrator, Civil Service (Medical): No    Lack of Transportation (Non-Medical): No  Physical Activity: Insufficiently Active (04/30/2023)   Exercise Vital Sign    Days of Exercise per Week: 7 days    Minutes of Exercise per Session: 20 min  Stress: No Stress Concern Present (04/30/2023)   [employer] of Occupational Health - Occupational Stress Questionnaire    Feeling of Stress : Not at all  Social Connections: Moderately Isolated (04/30/2023)   Social Connection and Isolation Panel [NHANES]    Frequency of Communication with Friends and Family: More than three times a week    Frequency of Social Gatherings with Friends and Family: More than three times a week    Attends Religious Services: 1 to 4 times per year    Active Member of [employer] or Organizations: No    Attends Banker Meetings: Never    Marital Status: Divorced    Tobacco  Counseling Ready to quit: Not Answered Counseling given: Not Answered Tobacco comments: Hx of 1 ppd smoker during adult life.     Clinical Intake:  Pre-visit preparation completed: Yes  Pain : No/denies pain Pain Score: 0-No pain     BMI - recorded: 25.36 Nutritional Status: BMI 25 -29 Overweight Nutritional Risks: None Diabetes: No  Lab Results  Component Value Date   HGBA1C 6.0 (H) 03/23/2022   HGBA1C 6.2 (H) 06/28/2021   HGBA1C 6.1 (A) 12/17/2019     How often do you need to have someone help you when you read instructions, pamphlets, or other written materials from your doctor or pharmacy?: 1 - Never  Interpreter Needed?: No  Information entered by :: Andres Boyd N. Kylee Nardozzi, LPN.   Activities of Daily Living     04/30/2023    9:57 AM  In your present state of health, do you have any difficulty performing the following activities:  Hearing? 0  Vision? 0  Difficulty concentrating or making decisions? 0  Walking or climbing stairs? 0  Dressing or bathing? 0  Doing errands, shopping? 0  Preparing Food and eating ? N  Using the Toilet? N  In the past six months, have you accidently leaked urine? N  Do you have problems with loss of bowel control? N  Managing your Medications? N  Managing your Finances? N  Housekeeping or managing your Housekeeping? N    Patient Care Team: Lavada Porteous, DO as PCP - General (Family Medicine) Olinda Bertrand, DO as PCP - Cardiology (Cardiology)  Indicate any recent Medical Services you may have received from other than [facility] providers in the past year (date may be approximate).     Assessment:   This is a routine wellness examination for Andres Boyd.  Hearing/Vision screen Hearing Screening - Comments:: Denies hearing difficulties.  Vision Screening - Comments:: Wears rx glasses -  not up to date with routine eye exams.    Goals Addressed             This Visit's Progress    Patient Stated       04/30/2023: "My goal for  2025 is to start going to the gym and get more physically activity."       Depression Screen     04/30/2023    9:56 AM 03/27/2022    1:51 PM 03/23/2022    2:53 PM 06/28/2021    2:09 PM 12/17/2019   10:50 AM 12/29/2016    1:30 PM  PHQ 2/9 Scores  PHQ - 2 Score 0 0 3 2 0 0  PHQ- 9 Score 3  3 7  0     Fall Risk     04/30/2023    9:56 AM 03/27/2022    1:52 PM 03/23/2022    2:48 PM 06/28/2021    2:09 PM  Fall Risk   Falls in the past year? 1 0 0 0  Number falls in past yr: 0 0  0  Injury with Fall? 0 0  0  Risk for fall due to : No Fall Risks No Fall Risks    Follow up Falls evaluation completed;Falls prevention discussed;Education provided Falls evaluation completed      MEDICARE RISK AT HOME:  Medicare Risk at Home Any stairs in or around the home?: No If so, are there any without handrails?: No Home free of loose throw rugs in walkways, pet beds, electrical cords, etc?: Yes Adequate lighting in your home to reduce risk of falls?: Yes Life alert?: No Use of a cane, walker or w/c?: No Grab bars in the bathroom?: Yes Shower chair or bench in shower?: No Elevated toilet seat or a handicapped toilet?: No  TIMED  UP AND GO:  Was the test performed?  No  Cognitive Function: 6CIT completed    04/30/2023    9:59 AM  MMSE - Mini Mental State Exam  Not completed: Unable to complete        04/30/2023    9:59 AM 03/27/2022    1:59 PM  6CIT Screen  What Year? 0 points 0 points  What month? 0 points 0 points  What time? 0 points 0 points  Count back from 20 0 points 0 points  Months in reverse 0 points 0 points  Repeat phrase 0 points 0 points  Total Score 0 points 0 points    Immunizations Immunization History  Administered Date(s) Administered   Influenza,inj,Quad PF,6+ Mos 11/10/2016, 11/15/2017, 09/11/2018   Influenza-Unspecified 11/15/2017   PFIZER(Purple Top)SARS-COV-2 Vaccination 03/09/2019, 04/08/2019, 12/17/2019   PNEUMOCOCCAL CONJUGATE-20 03/23/2022    Pneumococcal Polysaccharide-23 07/03/2016   Tdap 12/29/2016   Zoster Recombinant(Shingrix ) 05/16/2017, 10/17/2017    Screening Tests Health Maintenance  Topic Date Due   Colonoscopy  Never done   COVID-19 Vaccine (4 - 2024-25 season) 09/03/2022   Lung Cancer Screening  07/24/2023   INFLUENZA VACCINE  08/03/2023   Medicare Annual Wellness (AWV)  04/29/2024   DTaP/Tdap/Td (2 - Td or Tdap) 12/30/2026   Pneumonia Vaccine 49+ Years old  Completed   Hepatitis C Screening  Completed   Zoster Vaccines- Shingrix   Completed   HPV VACCINES  Aged Out   Meningococcal B Vaccine  Aged Out    Health Maintenance  Health Maintenance Due  Topic Date Due   Colonoscopy  Never done   COVID-19 Vaccine (4 - 2024-25 season) 09/03/2022   Health Maintenance Items Addressed: Yes, Patient is due for colonoscopy,  eye exam due to vision blurriness and covid vaccine.  Additional Screening:  Vision Screening: Recommended annual ophthalmology exams for early detection of glaucoma and other disorders of the eye.  Dental Screening: Recommended annual dental exams for proper oral hygiene  Community Resource Referral / Chronic Care Management: CRR required this visit?  No   CCM required this visit?  No     Plan:     I have personally reviewed and noted the following in the patient's chart:   Medical and social history Use of alcohol, tobacco or illicit drugs  Current medications and supplements including opioid prescriptions. Patient is not currently taking opioid prescriptions. Functional ability and status Nutritional status Physical activity Advanced directives List of other physicians Hospitalizations, surgeries, and ER visits in previous 12 months Vitals Screenings to include cognitive, depression, and falls Referrals and appointments  In addition, I have reviewed and discussed with patient certain preventive protocols, quality metrics, and best practice recommendations. A written  personalized care plan for preventive services as well as general preventive health recommendations were provided to patient.     Margette Sheldon, LPN   1/61/0960   After Visit Summary: (MyChart) Due to this being a telephonic visit, the after visit summary with patients personalized plan was offered to patient via MyChart   Notes: Please refer to Routing Comments.

## 2023-05-01 NOTE — Progress Notes (Signed)
 Attempted to reach patient. No answer. LVM for patient to call the office for follow up on care gaps.Linnie Riches, CMA

## 2023-05-02 NOTE — Progress Notes (Signed)
 Spoke with patient. Made appt for 5/12. Linnie Riches, CMA

## 2023-05-07 NOTE — Progress Notes (Signed)
 Patient have an appt 05/12 @930  with Dr.Bronson.

## 2023-05-14 ENCOUNTER — Ambulatory Visit: Admitting: Student

## 2023-05-14 NOTE — Progress Notes (Deleted)
    SUBJECTIVE:   Chief compliant/HPI: annual examination  Andres Boyd is a 68 y.o. who presents today for an annual exam.   History tabs reviewed and updated ***.   Review of systems form reviewed and notable for ***.   OBJECTIVE:   There were no vitals taken for this visit.  ***  ASSESSMENT/PLAN:  Assessment & Plan  Annual Examination  See AVS for age appropriate recommendations.  PHQ score ***, reviewed and discussed.  Blood pressure value is *** goal, discussed.   Considered the following screening exams based upon USPSTF recommendations: Diabetes screening: {discussed/ordered:14545} Screening for elevated cholesterol: {discussed/ordered:14545} HIV testing: {discussed/ordered:14545} Hepatitis C: {discussed/ordered:14545} Hepatitis B: {discussed/ordered:14545} Syphilis if at high risk: {discussed/ordered:14545} Reviewed risk factors for latent tuberculosis and {not indicated/requested/declined:14582} Colorectal cancer screening: {crcscreen:23821::"discussed, colonoscopy ordered"} Lung cancer screening: {discussed/declined/written EAVW:09811} See documentation below regarding discussion and indication.  PSA discussed and after engaging in discussion of possible risks, benefits and complications of screening patient elected to ***.   Follow up in 1 year or sooner if indicated.  MyChart Activation: {MYCHARTLIST:32522}   Lavada Porteous, DO [facility]

## 2023-10-08 ENCOUNTER — Other Ambulatory Visit: Payer: Self-pay

## 2023-10-08 DIAGNOSIS — I5020 Unspecified systolic (congestive) heart failure: Secondary | ICD-10-CM

## 2023-10-11 ENCOUNTER — Other Ambulatory Visit: Payer: Self-pay

## 2023-10-11 DIAGNOSIS — I5020 Unspecified systolic (congestive) heart failure: Secondary | ICD-10-CM

## 2023-10-11 MED ORDER — SACUBITRIL-VALSARTAN 49-51 MG PO TABS: 1.0000 | ORAL_TABLET | Freq: Two times a day (BID) | ORAL | 1 refills | Status: DC

## 2023-11-20 ENCOUNTER — Other Ambulatory Visit: Payer: Self-pay

## 2023-11-20 DIAGNOSIS — I5020 Unspecified systolic (congestive) heart failure: Secondary | ICD-10-CM

## 2024-01-29 ENCOUNTER — Other Ambulatory Visit: Payer: Self-pay | Admitting: Cardiology

## 2024-01-29 DIAGNOSIS — I5020 Unspecified systolic (congestive) heart failure: Secondary | ICD-10-CM
